# Patient Record
Sex: Female | Born: 1940 | Race: White | Hispanic: No | Marital: Married | Smoking: Former smoker
Health system: Southern US, Community
[De-identification: ages and names within clinical notes are randomized; demographics above are authoritative.]

## PROBLEM LIST (undated history)

## (undated) DIAGNOSIS — E214 Other specified disorders of parathyroid gland: Secondary | ICD-10-CM

## (undated) DIAGNOSIS — N200 Calculus of kidney: Secondary | ICD-10-CM

## (undated) DIAGNOSIS — H9190 Unspecified hearing loss, unspecified ear: Secondary | ICD-10-CM

## (undated) DIAGNOSIS — M1990 Unspecified osteoarthritis, unspecified site: Secondary | ICD-10-CM

## (undated) DIAGNOSIS — E785 Hyperlipidemia, unspecified: Secondary | ICD-10-CM

## (undated) DIAGNOSIS — Z87442 Personal history of urinary calculi: Secondary | ICD-10-CM

## (undated) DIAGNOSIS — I10 Essential (primary) hypertension: Secondary | ICD-10-CM

## (undated) DIAGNOSIS — G709 Myoneural disorder, unspecified: Secondary | ICD-10-CM

## (undated) DIAGNOSIS — G250 Essential tremor: Secondary | ICD-10-CM

## (undated) DIAGNOSIS — K219 Gastro-esophageal reflux disease without esophagitis: Secondary | ICD-10-CM

## (undated) HISTORY — DX: Essential (primary) hypertension: I10

## (undated) HISTORY — DX: Hyperlipidemia, unspecified: E78.5

## (undated) HISTORY — PX: JOINT REPLACEMENT: SHX530

## (undated) HISTORY — DX: Unspecified hearing loss, unspecified ear: H91.90

## (undated) HISTORY — PX: KIDNEY STONE SURGERY: SHX686

---

## 2011-09-01 HISTORY — PX: JOINT REPLACEMENT: SHX530

## 2011-09-11 ENCOUNTER — Inpatient Hospital Stay: Admit: 2011-09-11 | Payer: Self-pay | Admitting: Orthopaedic Surgery

## 2011-09-11 SURGERY — ARTHROPLASTY, HIP, TOTAL, ANTERIOR APPROACH
Anesthesia: General | Laterality: Right

## 2011-09-11 SURGERY — Surgical Case
Anesthesia: *Unknown

## 2011-12-23 ENCOUNTER — Other Ambulatory Visit (HOSPITAL_COMMUNITY): Payer: Self-pay | Admitting: Orthopaedic Surgery

## 2012-01-27 HISTORY — PX: OTHER SURGICAL HISTORY: SHX169

## 2012-02-08 ENCOUNTER — Encounter (HOSPITAL_COMMUNITY): Payer: Self-pay | Admitting: Pharmacy Technician

## 2012-02-12 ENCOUNTER — Encounter (HOSPITAL_COMMUNITY)
Admission: RE | Admit: 2012-02-12 | Discharge: 2012-02-12 | Disposition: A | Discharge status: Home / Self Care | Payer: Medicare Other | Source: Ambulatory Visit | Attending: Orthopaedic Surgery | Admitting: Orthopaedic Surgery

## 2012-02-12 ENCOUNTER — Encounter (HOSPITAL_COMMUNITY): Payer: Self-pay

## 2012-02-12 HISTORY — DX: Gastro-esophageal reflux disease without esophagitis: K21.9

## 2012-02-12 HISTORY — DX: Unspecified osteoarthritis, unspecified site: M19.90

## 2012-02-12 LAB — CBC
Platelets: 349 10*3/uL (ref 150–400)
RBC: 4.57 MIL/uL (ref 3.87–5.11)
RDW: 13.7 % (ref 11.5–15.5)
WBC: 7.3 10*3/uL (ref 4.0–10.5)

## 2012-02-12 LAB — BASIC METABOLIC PANEL
Calcium: 10.4 mg/dL (ref 8.4–10.5)
GFR calc Af Amer: >90 mL/min (ref >90)
GFR calc non Af Amer: 86 mL/min — ABNORMAL LOW (ref >90)
Sodium: 140 mEq/L (ref 135–145)

## 2012-02-12 LAB — PROTIME-INR: INR: 0.93 (ref 0.00–1.49)

## 2012-02-12 LAB — URINALYSIS, ROUTINE W REFLEX MICROSCOPIC
Leukocytes, UA: NEGATIVE
Nitrite: NEGATIVE
Specific Gravity, Urine: 1.02 (ref 1.005–1.030)
Urobilinogen, UA: 0.2 mg/dL (ref 0.0–1.0)
pH: 5.5 (ref 5.0–8.0)

## 2012-02-12 LAB — SURGICAL PCR SCREEN
MRSA, PCR: NEGATIVE
Staphylococcus aureus: POSITIVE — AB

## 2012-02-12 LAB — URINE MICROSCOPIC-ADD ON

## 2012-02-12 LAB — APTT: aPTT: 34 seconds (ref 24–37)

## 2012-02-12 NOTE — Patient Instructions (Signed)
20 Dawanda Mapel  02/12/2012   Your procedure is scheduled on:  02-19-2012  Report to [facility] at  1000 AM.  Call this number if you have problems the morning of surgery: [phone]   Remember:   Do not eat food or drink liquids:After Midnight.  .  Take these medicines the morning of surgery with A SIP OF WATER: no meds to take   Do not wear jewelry or make up.  Do not wear lotions, powders, or perfumes.Do not wear deodorant.    Do not bring valuables to the hospital.  Contacts, dentures or bridgework may not be worn into surgery.  Leave suitcase in the car. After surgery it may be brought to your room.  For patients admitted to the hospital, checkout time is 11:00 AM the day of    discharge.     Special Instructions: CHG Shower Use Special Wash: 1/2 bottle night before surgery and 1/2 bottle morning of surgery, use regular soap on face and front and back private area.no shaving for 2 days before showers.   Please read over the following fact sheets that you were given: MRSA Information, blood fact sheet  [facility] pre op nurse phone number [phone], call if needed

## 2012-02-19 ENCOUNTER — Encounter (HOSPITAL_COMMUNITY)
Admission: RE | Disposition: A | Discharge status: Home Health | Payer: Self-pay | Source: Ambulatory Visit | Attending: Orthopaedic Surgery

## 2012-02-19 ENCOUNTER — Encounter (HOSPITAL_COMMUNITY): Payer: Self-pay | Admitting: *Deleted

## 2012-02-19 ENCOUNTER — Inpatient Hospital Stay (HOSPITAL_COMMUNITY)
Admission: RE | Admit: 2012-02-19 | Discharge: 2012-02-21 | DRG: 470 | Disposition: A | Discharge status: Home Health | Payer: Medicare Other | Source: Ambulatory Visit | Attending: Orthopaedic Surgery | Admitting: Orthopaedic Surgery

## 2012-02-19 ENCOUNTER — Encounter (HOSPITAL_COMMUNITY): Payer: Self-pay | Admitting: Orthopedic Surgery

## 2012-02-19 ENCOUNTER — Other Ambulatory Visit (HOSPITAL_COMMUNITY): Payer: Medicare Other

## 2012-02-19 ENCOUNTER — Inpatient Hospital Stay (HOSPITAL_COMMUNITY): Payer: Medicare Other

## 2012-02-19 ENCOUNTER — Encounter (HOSPITAL_COMMUNITY): Payer: Self-pay | Admitting: Anesthesiology

## 2012-02-19 ENCOUNTER — Ambulatory Visit (HOSPITAL_COMMUNITY): Payer: Medicare Other

## 2012-02-19 ENCOUNTER — Ambulatory Visit (HOSPITAL_COMMUNITY): Payer: Medicare Other | Admitting: Anesthesiology

## 2012-02-19 DIAGNOSIS — Z01812 Encounter for preprocedural laboratory examination: Secondary | ICD-10-CM

## 2012-02-19 DIAGNOSIS — M1610 Unilateral primary osteoarthritis, unspecified hip: Principal | ICD-10-CM | POA: Diagnosis present

## 2012-02-19 DIAGNOSIS — M169 Osteoarthritis of hip, unspecified: Secondary | ICD-10-CM

## 2012-02-19 HISTORY — PX: TOTAL HIP ARTHROPLASTY: SHX124

## 2012-02-19 LAB — TYPE AND SCREEN

## 2012-02-19 SURGERY — ARTHROPLASTY, HIP, TOTAL, ANTERIOR APPROACH
Anesthesia: Spinal | Site: Hip | Laterality: Right | Wound class: Clean

## 2012-02-19 MED ORDER — ONDANSETRON HCL 4 MG/2ML IJ SOLN
INTRAMUSCULAR | Status: DC | PRN
  Administered 2012-02-19: 4 mg via INTRAVENOUS

## 2012-02-19 MED ORDER — MIDAZOLAM HCL 5 MG/5ML IJ SOLN
INTRAMUSCULAR | Status: DC | PRN
  Administered 2012-02-19 (×2): 1 mg via INTRAVENOUS

## 2012-02-19 MED ORDER — CEFAZOLIN SODIUM 1-5 GM-% IV SOLN
1.0000 g | Freq: Four times a day (QID) | INTRAVENOUS | Status: AC
  Administered 2012-02-19 – 2012-02-20 (×2): 1 g via INTRAVENOUS
  Filled 2012-02-19 (×2): qty 50

## 2012-02-19 MED ORDER — SODIUM CHLORIDE 0.9 % IV SOLN
INTRAVENOUS | Status: DC
  Administered 2012-02-19: 75 mL/h via INTRAVENOUS
  Administered 2012-02-20: 06:00:00 via INTRAVENOUS

## 2012-02-19 MED ORDER — DOCUSATE SODIUM 100 MG PO CAPS
100.0000 mg | ORAL_CAPSULE | Freq: Two times a day (BID) | ORAL | Status: DC
  Administered 2012-02-19 – 2012-02-21 (×3): 100 mg via ORAL

## 2012-02-19 MED ORDER — 0.9 % SODIUM CHLORIDE (POUR BTL) OPTIME
TOPICAL | Status: DC | PRN
  Administered 2012-02-19: 1000 mL

## 2012-02-19 MED ORDER — HYDROMORPHONE HCL PF 1 MG/ML IJ SOLN
0.2500 mg | INTRAMUSCULAR | Status: DC | PRN
  Administered 2012-02-19 (×3): 0.5 mg via INTRAVENOUS

## 2012-02-19 MED ORDER — LACTATED RINGERS IV SOLN
INTRAVENOUS | Status: DC | PRN
  Administered 2012-02-19 (×3): via INTRAVENOUS

## 2012-02-19 MED ORDER — KETAMINE HCL 10 MG/ML IJ SOLN
INTRAMUSCULAR | Status: DC | PRN
  Administered 2012-02-19: 10 mg via INTRAVENOUS
  Administered 2012-02-19 (×8): 5 mg via INTRAVENOUS

## 2012-02-19 MED ORDER — METHOCARBAMOL 100 MG/ML IJ SOLN
500.0000 mg | Freq: Four times a day (QID) | INTRAVENOUS | Status: DC | PRN
  Administered 2012-02-19: 500 mg via INTRAVENOUS
  Filled 2012-02-19 (×2): qty 5

## 2012-02-19 MED ORDER — PHENYLEPHRINE HCL 10 MG/ML IJ SOLN
INTRAMUSCULAR | Status: DC | PRN
  Administered 2012-02-19 (×2): 80 ug via INTRAVENOUS

## 2012-02-19 MED ORDER — ONDANSETRON HCL 4 MG/2ML IJ SOLN: 4.0000 mg | Freq: Four times a day (QID) | INTRAMUSCULAR | Status: DC | PRN

## 2012-02-19 MED ORDER — LACTATED RINGERS IV SOLN
INTRAVENOUS | Status: DC
  Administered 2012-02-19: 1000 mL via INTRAVENOUS

## 2012-02-19 MED ORDER — PHENYLEPHRINE HCL 10 MG/ML IJ SOLN
10.0000 mg | INTRAVENOUS | Status: DC | PRN
  Administered 2012-02-19: 50 ug/min via INTRAVENOUS

## 2012-02-19 MED ORDER — BLISTEX EX OINT
TOPICAL_OINTMENT | CUTANEOUS | Status: AC
  Administered 2012-02-19: 19:00:00
  Filled 2012-02-19: qty 10

## 2012-02-19 MED ORDER — FENTANYL CITRATE 0.05 MG/ML IJ SOLN
INTRAMUSCULAR | Status: DC | PRN
  Administered 2012-02-19: 100 ug via INTRAVENOUS

## 2012-02-19 MED ORDER — KETOROLAC TROMETHAMINE 15 MG/ML IJ SOLN
7.5000 mg | Freq: Four times a day (QID) | INTRAMUSCULAR | Status: AC
  Administered 2012-02-19 – 2012-02-20 (×3): 7.5 mg via INTRAVENOUS
  Filled 2012-02-19 (×4): qty 1

## 2012-02-19 MED ORDER — PROPOFOL 10 MG/ML IV EMUL
INTRAVENOUS | Status: DC | PRN
  Administered 2012-02-19: 300 ug/kg/min via INTRAVENOUS

## 2012-02-19 MED ORDER — CEFAZOLIN SODIUM-DEXTROSE 2-3 GM-% IV SOLR
INTRAVENOUS | Status: AC
  Filled 2012-02-19: qty 50

## 2012-02-19 MED ORDER — ACETAMINOPHEN 10 MG/ML IV SOLN
INTRAVENOUS | Status: DC | PRN
  Administered 2012-02-19: 1000 mg via INTRAVENOUS

## 2012-02-19 MED ORDER — ASPIRIN EC 325 MG PO TBEC
325.0000 mg | DELAYED_RELEASE_TABLET | Freq: Two times a day (BID) | ORAL | Status: DC
  Filled 2012-02-19 (×2): qty 1

## 2012-02-19 MED ORDER — BUPIVACAINE IN DEXTROSE 0.75-8.25 % IT SOLN
INTRATHECAL | Status: DC | PRN
  Administered 2012-02-19: 10 mg via INTRATHECAL

## 2012-02-19 MED ORDER — DEXAMETHASONE SODIUM PHOSPHATE 10 MG/ML IJ SOLN
INTRAMUSCULAR | Status: DC | PRN
  Administered 2012-02-19: 10 mg via INTRAVENOUS

## 2012-02-19 MED ORDER — DIPHENHYDRAMINE HCL 12.5 MG/5ML PO ELIX: 12.5000 mg | ORAL_SOLUTION | ORAL | Status: DC | PRN

## 2012-02-19 MED ORDER — ATORVASTATIN CALCIUM 20 MG PO TABS
20.0000 mg | ORAL_TABLET | Freq: Every day | ORAL | Status: DC
  Administered 2012-02-19 – 2012-02-20 (×2): 20 mg via ORAL
  Filled 2012-02-19 (×3): qty 1

## 2012-02-19 MED ORDER — HYDROMORPHONE HCL PF 1 MG/ML IJ SOLN
INTRAMUSCULAR | Status: AC
  Filled 2012-02-19: qty 1

## 2012-02-19 MED ORDER — ASPIRIN EC 325 MG PO TBEC
325.0000 mg | DELAYED_RELEASE_TABLET | Freq: Two times a day (BID) | ORAL | Status: DC
  Administered 2012-02-20 – 2012-02-21 (×3): 325 mg via ORAL
  Filled 2012-02-19 (×5): qty 1

## 2012-02-19 MED ORDER — ACETAMINOPHEN 325 MG PO TABS: 650.0000 mg | ORAL_TABLET | Freq: Four times a day (QID) | ORAL | Status: DC | PRN

## 2012-02-19 MED ORDER — OXYCODONE HCL 5 MG PO TABS
5.0000 mg | ORAL_TABLET | ORAL | Status: DC | PRN
  Administered 2012-02-20: 10 mg via ORAL
  Administered 2012-02-21 (×3): 5 mg via ORAL
  Filled 2012-02-19: qty 1
  Filled 2012-02-19: qty 2
  Filled 2012-02-19: qty 1
  Filled 2012-02-19: qty 2

## 2012-02-19 MED ORDER — METOCLOPRAMIDE HCL 5 MG/ML IJ SOLN: 5.0000 mg | Freq: Three times a day (TID) | INTRAMUSCULAR | Status: DC | PRN

## 2012-02-19 MED ORDER — LIDOCAINE HCL (CARDIAC) 20 MG/ML IV SOLN
INTRAVENOUS | Status: DC | PRN
  Administered 2012-02-19: 50 mg via INTRAVENOUS

## 2012-02-19 MED ORDER — CEFAZOLIN SODIUM-DEXTROSE 2-3 GM-% IV SOLR: 2.0000 g | INTRAVENOUS | Status: DC

## 2012-02-19 MED ORDER — MORPHINE SULFATE 2 MG/ML IJ SOLN: 2.0000 mg | INTRAMUSCULAR | Status: DC | PRN

## 2012-02-19 MED ORDER — ALUM & MAG HYDROXIDE-SIMETH 200-200-20 MG/5ML PO SUSP: 30.0000 mL | ORAL | Status: DC | PRN

## 2012-02-19 MED ORDER — METOCLOPRAMIDE HCL 10 MG PO TABS: 5.0000 mg | ORAL_TABLET | Freq: Three times a day (TID) | ORAL | Status: DC | PRN

## 2012-02-19 MED ORDER — VITAMIN D3 25 MCG (1000 UNIT) PO TABS
5000.0000 [IU] | ORAL_TABLET | Freq: Every day | ORAL | Status: DC
  Administered 2012-02-20 – 2012-02-21 (×2): 5000 [IU] via ORAL
  Filled 2012-02-19 (×3): qty 5

## 2012-02-19 MED ORDER — ONDANSETRON HCL 4 MG PO TABS: 4.0000 mg | ORAL_TABLET | Freq: Four times a day (QID) | ORAL | Status: DC | PRN

## 2012-02-19 MED ORDER — MENTHOL 3 MG MT LOZG: 1.0000 | LOZENGE | OROMUCOSAL | Status: DC | PRN

## 2012-02-19 MED ORDER — EPHEDRINE SULFATE 50 MG/ML IJ SOLN
INTRAMUSCULAR | Status: DC | PRN
  Administered 2012-02-19 (×2): 5 mg via INTRAVENOUS

## 2012-02-19 MED ORDER — ACETAMINOPHEN 10 MG/ML IV SOLN
INTRAVENOUS | Status: AC
  Filled 2012-02-19: qty 100

## 2012-02-19 MED ORDER — ACETAMINOPHEN 650 MG RE SUPP: 650.0000 mg | Freq: Four times a day (QID) | RECTAL | Status: DC | PRN

## 2012-02-19 MED ORDER — ZOLPIDEM TARTRATE 5 MG PO TABS: 5.0000 mg | ORAL_TABLET | Freq: Every evening | ORAL | Status: DC | PRN

## 2012-02-19 MED ORDER — PHENOL 1.4 % MT LIQD: 1.0000 | OROMUCOSAL | Status: DC | PRN

## 2012-02-19 MED ORDER — METHOCARBAMOL 500 MG PO TABS
500.0000 mg | ORAL_TABLET | Freq: Four times a day (QID) | ORAL | Status: DC | PRN
  Administered 2012-02-21: 500 mg via ORAL
  Filled 2012-02-19: qty 1

## 2012-02-19 SURGICAL SUPPLY — 36 items
BAG ZIPLOCK 12X15 (MISCELLANEOUS) ×4 IMPLANT
BLADE SAW SGTL 18X1.27X75 (BLADE) ×2 IMPLANT
BLADE SURG SZ10 CARB STEEL (BLADE) ×2 IMPLANT
CELLS DAT CNTRL 66122 CELL SVR (MISCELLANEOUS) ×1 IMPLANT
CLOTH BEACON ORANGE TIMEOUT ST (SAFETY) ×2 IMPLANT
DRAPE C-ARM 42X72 X-RAY (DRAPES) ×2 IMPLANT
DRAPE STERI IOBAN 125X83 (DRAPES) ×2 IMPLANT
DRAPE U-SHAPE 47X51 STRL (DRAPES) ×6 IMPLANT
DRSG MEPILEX BORDER 4X8 (GAUZE/BANDAGES/DRESSINGS) ×2 IMPLANT
DURAPREP 26ML APPLICATOR (WOUND CARE) ×2 IMPLANT
ELECT BLADE TIP CTD 4 INCH (ELECTRODE) ×2 IMPLANT
ELECT REM PT RETURN 9FT ADLT (ELECTROSURGICAL) ×2
ELECTRODE REM PT RTRN 9FT ADLT (ELECTROSURGICAL) ×1 IMPLANT
FACESHIELD LNG OPTICON STERILE (SAFETY) ×8 IMPLANT
GAUZE XEROFORM 1X8 LF (GAUZE/BANDAGES/DRESSINGS) ×2 IMPLANT
GLOVE BIO SURGEON STRL SZ7 (GLOVE) ×2 IMPLANT
GLOVE BIO SURGEON STRL SZ7.5 (GLOVE) ×2 IMPLANT
GLOVE BIOGEL PI IND STRL 7.5 (GLOVE) IMPLANT
GLOVE BIOGEL PI IND STRL 8 (GLOVE) ×1 IMPLANT
GLOVE BIOGEL PI INDICATOR 7.5 (GLOVE)
GLOVE BIOGEL PI INDICATOR 8 (GLOVE) ×1
GLOVE ECLIPSE 7.0 STRL STRAW (GLOVE) ×2 IMPLANT
GOWN STRL REIN XL XLG (GOWN DISPOSABLE) ×4 IMPLANT
KIT BASIN OR (CUSTOM PROCEDURE TRAY) ×2 IMPLANT
PACK TOTAL JOINT (CUSTOM PROCEDURE TRAY) ×2 IMPLANT
PADDING CAST COTTON 6X4 STRL (CAST SUPPLIES) ×2 IMPLANT
RTRCTR WOUND ALEXIS 18CM MED (MISCELLANEOUS) ×2
STAPLER VISISTAT 35W (STAPLE) ×2 IMPLANT
SUT ETHIBOND NAB CT1 #1 30IN (SUTURE) ×4 IMPLANT
SUT VIC AB 1 CT1 36 (SUTURE) ×4 IMPLANT
SUT VIC AB 2-0 CT1 27 (SUTURE) ×2
SUT VIC AB 2-0 CT1 TAPERPNT 27 (SUTURE) ×2 IMPLANT
SUT VLOC 180 0 24IN GS25 (SUTURE) ×2 IMPLANT
TOWEL OR 17X26 10 PK STRL BLUE (TOWEL DISPOSABLE) ×2 IMPLANT
TOWEL OR NON WOVEN STRL DISP B (DISPOSABLE) ×2 IMPLANT
TRAY FOLEY CATH 14FRSI W/METER (CATHETERS) ×2 IMPLANT

## 2012-02-19 NOTE — Anesthesia Postprocedure Evaluation (Signed)
  Anesthesia Post-op Note  Patient: Alison Chandler  Procedure(s) Performed: Procedure(s) (LRB): TOTAL HIP ARTHROPLASTY ANTERIOR APPROACH (Right)  Patient Location: PACU  Anesthesia Type: Spinal  Level of Consciousness: oriented and sedated  Airway and Oxygen Therapy: Patient Spontanous Breathing and Patient connected to nasal cannula oxygen  Post-op Pain: mild  Post-op Assessment: Post-op Vital signs reviewed, Patient's Cardiovascular Status Stable, Respiratory Function Stable and Patent Airway  Post-op Vital Signs: stable  Complications: No apparent anesthesia complications

## 2012-02-19 NOTE — Anesthesia Procedure Notes (Addendum)
Spinal  Patient location during procedure: OR Start time: 02/19/2012 12:57 PM End time: 02/19/2012 1:07 PM Staffing Anesthesiologist: Lestine Box B Performed by: anesthesiologist  Preanesthetic Checklist Completed: patient identified, site marked, surgical consent, pre-op evaluation, timeout performed, IV checked, risks and benefits discussed and monitors and equipment checked Spinal Block Patient position: sitting Prep: Betadine and site prepped and draped Patient monitoring: heart rate, cardiac monitor, continuous pulse ox and blood pressure Approach: right paramedian Location: L3-4 Injection technique: single-shot Needle Needle type: Quincke  Needle gauge: 25 G Needle length: 9 cm Needle insertion depth: 4 cm Assessment Sensory level: T6 Additional Notes [id number], 2014-09

## 2012-02-19 NOTE — H&P (Signed)
Alison Chandler is an 71 Chandler.o. female.   Chief Complaint:   Severe right hip pain HPI:   71 yo female with bone-on-bone wear of her right hip with severe end-stage OA.  She has failed conservative treatment and wishes to proceed with a right total hip replacement.  She understands the risks of blood loss, nerve and vessel injury, infection, DVT and PE.  The goals are decreased pain and improved mobility.  Past Medical History  Diagnosis Date  . Arthritis   . GERD (gastroesophageal reflux disease)     Past Surgical History  Procedure Date  . Colonscopy 01-27-2012    History reviewed. No pertinent family history. Social History:  reports that she quit smoking about 40 years ago. She has never used smokeless tobacco. She reports that she drinks about .6 ounces of alcohol per week. She reports that she does not use illicit drugs.  Allergies: No Known Allergies  Medications Prior to Admission  Medication Sig Dispense Refill  . acetaminophen (TYLENOL) 500 MG tablet Take 500-1,000 mg by mouth every 6 (six) hours as needed. For pain      . atorvastatin (LIPITOR) 20 MG tablet Take 20 mg by mouth at bedtime.      . Calcium Carbonate (CALCIUM 600 PO) Take 1 tablet by mouth daily with breakfast. Pt says calcium citrate      . cholecalciferol (VITAMIN D) 1000 UNITS tablet Take 5,000 Units by mouth daily with breakfast.      . estradiol-norethindrone (COMBIPATCH) 0.05-0.14 MG/DAY Place 1 patch onto the skin once a week. On Mondays      . fish oil-omega-3 fatty acids 1000 MG capsule Take 1 g by mouth daily with breakfast.      . glucosamine-chondroitin 500-400 MG tablet Take 1 tablet by mouth daily with breakfast.      . naproxen sodium (ANAPROX) 220 MG tablet Take 220-440 mg by mouth every 12 (twelve) hours as needed. For pain        Results for orders placed during the hospital encounter of 02/19/12 (from the past 48 hour(s))  ABO/RH     Status: Normal   Collection Time   02/19/12 10:25 AM   Component Value Range Comment   ABO/RH(D) O POS     TYPE AND SCREEN     Status: Normal   Collection Time   02/19/12 10:50 AM      Component Value Range Comment   ABO/RH(D) O POS      Antibody Screen NEG      Sample Expiration 02/22/2012      No results found.  Review of Systems  All other systems reviewed and are negative.    Blood pressure 182/109, pulse 97, temperature 98 F (36.7 C), resp. rate 20, SpO2 96.00%. Physical Exam  Constitutional: She is oriented to person, place, and time. She appears well-developed and well-nourished.  HENT:  Head: Normocephalic and atraumatic.  Eyes: EOM are normal. Pupils are equal, round, and reactive to light.  Neck: Normal range of motion. Neck supple.  Cardiovascular: Normal rate and regular rhythm.   Respiratory: Effort normal and breath sounds normal.  GI: Soft. Bowel sounds are normal.  Musculoskeletal:       Right hip: She exhibits decreased range of motion, decreased strength and bony tenderness.  Neurological: She is alert and oriented to person, place, and time.  Skin: Skin is warm and dry.  Psychiatric: She has a normal mood and affect.     Assessment/Plan Severe right hip  pain with end-stage OA 1) to the OR today for a right total hip replacement  Alison Chandler 02/19/2012, 12:45 PM

## 2012-02-19 NOTE — Brief Op Note (Signed)
02/19/2012  3:20 PM  PATIENT:  Alison Chandler  71 y.o. female  PRE-OPERATIVE DIAGNOSIS:  Severe osteoarthritis right hip  POST-OPERATIVE DIAGNOSIS:  Severe osteoarthritis right hip  PROCEDURE:  Procedure(s) (LRB): TOTAL HIP ARTHROPLASTY ANTERIOR APPROACH (Right)  SURGEON:  Surgeon(s) and Role:    * Kathryne Hitch, MD - Primary    * Velna Ochs, MD - Assisting  PHYSICIAN ASSISTANT:   ASSISTANTS: Marcene Corning, MD    ANESTHESIA:   spinal  EBL:  Total I/O In: 2300 [I.V.:2300] Out: 830 [Urine:330; Blood:500]  BLOOD ADMINISTERED:none  DRAINS: none   LOCAL MEDICATIONS USED:  NONE  SPECIMEN:  No Specimen  DISPOSITION OF SPECIMEN:  N/A  COUNTS:  YES  TOURNIQUET:  * No tourniquets in log *  DICTATION: .Other Dictation: Dictation Number [id number]  PLAN OF CARE: Admit to inpatient   PATIENT DISPOSITION:  PACU - hemodynamically stable.   Delay start of Pharmacological VTE agent (>24hrs) due to surgical blood loss or risk of bleeding: no

## 2012-02-19 NOTE — Progress Notes (Signed)
Patient showed RN a spider bite top of upper left leg. Small red bite with a bruise around it from scratching per patient. Note left for MD on front of chart.

## 2012-02-19 NOTE — Transfer of Care (Signed)
Immediate Anesthesia Transfer of Care Note  Patient: Alison Chandler  Procedure(s) Performed: Procedure(s) (LRB): TOTAL HIP ARTHROPLASTY ANTERIOR APPROACH (Right)  Patient Location: PACU  Anesthesia Type: MAC and Spinal  Level of Consciousness: awake, alert  and oriented  Airway & Oxygen Therapy: Patient Spontanous Breathing and Patient connected to face mask oxygen  Post-op Assessment: Report given to PACU RN, Post -op Vital signs reviewed and stable and Patient moving all extremities  Post vital signs: Reviewed and stable  Complications: No apparent anesthesia complications

## 2012-02-19 NOTE — Preoperative (Signed)
Beta Blockers   Reason not to administer Beta Blockers:Not Applicable, no on home BB

## 2012-02-19 NOTE — Anesthesia Preprocedure Evaluation (Addendum)
Anesthesia Evaluation  Patient identified by MRN, date of birth, ID band Patient awake    Reviewed: Allergy & Precautions, H&P , NPO status , Patient's Chart, lab work & pertinent test results, reviewed documented beta blocker date and time   Airway Mallampati: II TM Distance: >3 FB Neck ROM: Full    Dental  (+) Teeth Intact and Dental Advisory Given   Pulmonary neg pulmonary ROS,  breath sounds clear to auscultation        Cardiovascular negative cardio ROS  Rhythm:Regular Rate:Normal  Denies HTN, borderline, no Rx Denies cardiac symptoms   Neuro/Psych negative neurological ROS  negative psych ROS   GI/Hepatic negative GI ROS, Neg liver ROS,   Endo/Other  negative endocrine ROS  Renal/GU negative Renal ROS  negative genitourinary   Musculoskeletal   Abdominal   Peds negative pediatric ROS (+)  Hematology negative hematology ROS (+)   Anesthesia Other Findings   Reproductive/Obstetrics negative OB ROS                           Anesthesia Physical Anesthesia Plan  ASA: II  Anesthesia Plan: Spinal   Post-op Pain Management:    Induction: Intravenous  Airway Management Planned: Mask  Additional Equipment:   Intra-op Plan:   Post-operative Plan:   Informed Consent: I have reviewed the patients History and Physical, chart, labs and discussed the procedure including the risks, benefits and alternatives for the proposed anesthesia with the patient or authorized representative who has indicated his/her understanding and acceptance.   Dental advisory given  Plan Discussed with: CRNA and Surgeon  Anesthesia Plan Comments:        Anesthesia Quick Evaluation

## 2012-02-20 LAB — CBC
HCT: 35.2 % — ABNORMAL LOW (ref 36.0–46.0)
Hemoglobin: 11.4 g/dL — ABNORMAL LOW (ref 12.0–15.0)
MCH: 30 pg (ref 26.0–34.0)
MCHC: 32.4 g/dL (ref 30.0–36.0)
RDW: 13.6 % (ref 11.5–15.5)

## 2012-02-20 LAB — BASIC METABOLIC PANEL
BUN: 15 mg/dL (ref 6–23)
Calcium: 9.5 mg/dL (ref 8.4–10.5)
GFR calc Af Amer: >90 mL/min (ref >90)
GFR calc non Af Amer: >90 mL/min (ref >90)
Glucose, Bld: 150 mg/dL — ABNORMAL HIGH (ref 70–99)
Potassium: 3.5 mEq/L (ref 3.5–5.1)
Sodium: 135 mEq/L (ref 135–145)

## 2012-02-20 NOTE — Op Note (Signed)
Alison Chandler, Alison Chandler                 ACCOUNT NO.:  [id number]  MEDICAL RECORD NO.:  [id number]  LOCATION:  1618                         FACILITY:  [facility]  PHYSICIAN:  Vanita Panda. Magnus Ivan, M.D.DATE OF BIRTH:  1941-06-29  DATE OF PROCEDURE:  02/19/2012 DATE OF DISCHARGE:                              OPERATIVE REPORT   PREOPERATIVE DIAGNOSES:  End-stage arthritis and degenerative joint disease, right hip.  POSTOPERATIVE DIAGNOSES:  End-stage arthritis and degenerative joint disease, right hip.  PROCEDURE:  Right total hip arthroplasty through direct anterior approach.  IMPLANTS:  Size 52 sector Gription acetabular component from DePuy, size 12 Corail femoral component with standard offset, size 32+ 4 neutral polyethylene liner, size 32+ 1 metal hip ball.  SURGEON:  Vanita Panda. Magnus Ivan, M.D.  ASSISTANT:  Lubertha Basque. Jerl Santos, M.D.  ANESTHESIA:  Spinal.  BLOOD LOSS:  500-600 mL.  COMPLICATIONS:  None.  INDICATIONS:  Alison Chandler is a 71 year old female with severe debilitating end-stage arthritis of her right hip.  This has been going on for many years and she has failed conservative treatment.  She wished to proceed with total hip arthroplasty.  X-rays showed bone-on-bone wear of her hip.  She understands the risk of acute blood loss anemia, DVT, PE, infection, fracture, and does wish to proceed.  The goals are improving mobility and decreased pain.  PROCEDURE DESCRIPTION:  After informed consent was obtained, appropriate right hip was marked.  She was brought to the operating room and while she was on her stretcher, spinal anesthesia was obtained.  She was then placed in a supine position and on her stretcher, and a Foley catheter was placed and then traction boots were placed on both her feet for placement of supine on the operating Hana table with perineal post in place.  In the supine position, both legs were placed in traction boots in inline skeletal traction with no  traction applied.  Her hips were then assessed under direct fluoroscopy and had obtained the hips center for the equal view of the pelvis to obtain leg length measurements.  We then put this tape to C-arm out of the field and prepped the right hip with DuraPrep and sterile drapes.  A time-out was called and she was identified as correct patient and correct right hip.  I then made an incision just posterior and inferior to the anterior-superior iliac spine and carried this obliquely down the leg.  I dissected down to the tensor fascia lata and then divided the tensor fascia lata obliquely. The cobra retractors were placed around the lateral neck and one in the medial neck underneath the rectus femoris.  I then divided the hip capsule and cauterized the lateral femoral circumflex vessels.  I put the cobra retractors within the hip capsule.  I then made my femoral neck cut just proximal to the lesser trochanter and finished this with an osteotome using an oscillating saw.  I removed the femoral head in its entirety and then cleaned the acetabulum of soft tissue and debris. I then placed a Bent Hohmann medially and a cobra retractor laterally and began reaming from size 42 reamer in 2 mm increments all the way up to  50 with all reamers placed under direct visualization and the last reamers placed under direct visualization and direct fluoroscopy to obtain my depth of resection as well as inclination and abduction.  I then placed a real size 50 Gription acetabular component from DePuy and knocked this into place under direct visualization and fluoroscopy.  I was pleased with its positioning.  Next, attention was turned to the femur.  The leg was externally rotated 90 degrees, extended and adducted.  I placed a Mueller retractor medially and a long Hohmann retractor into the greater trochanter.  I released the piriformis and the lateral capsule and gained access to the femoral canal.  I used  a box cutting guide to open the femoral canal and then used a rongeur to lateralize.  I then began broaching from a size 8 broach all the way to a size 12 and the 12 was felt to be stable.  I placed a standard trial neck and a 32+ 1.5 hip ball.  We reduced this in the acetabulum and it was stable and her leg lengths were equal, so we redislocated the hip and removed all trial components.  We then placed the real Corail femoral component, size 12 with a collar and the real 32+ 1 hip ball. We reduced this back into the acetabulum, and again it was stable.  We irrigated the soft tissues with normal saline and closed the joint itself.  I closed the joint capsule with interrupted #1 Ethibond suture. I used a running 0 V-Loc suture in the tensor fascia lata to close this interval and 2-0 Vicryl in the deep and subcutaneous tissue and staples on the skin.  Well-padded sterile dressing was applied.  She was taken off the Hana table to the recovery room in stable condition.  All final counts were correct.  There were no complications noted.     Vanita Panda. Magnus Ivan, M.D.     CYB/MEDQ  D:  02/19/2012  T:  02/19/2012  Job:  [id number]

## 2012-02-20 NOTE — Progress Notes (Signed)
Physical Therapy Treatment Patient Details Name: Alison Chandler MRN: [id number] DOB: July 08, 1941 Today's Date: 02/20/2012 Time: 1308-6578 PT Time Calculation (min): 24 min  PT Assessment / Plan / Recommendation Comments on Treatment Session  Pt progressing very well with ambulation and exercises.  Trialed straight cane, will continue to trial both to determine most comfort.  Pt has both at home.     Follow Up Recommendations  Home health PT    Barriers to Discharge        Equipment Recommendations  Rolling walker with 5" wheels    Recommendations for Other Services    Frequency 7X/week   Plan Discharge plan remains appropriate    Precautions / Restrictions Precautions Precautions: None Restrictions Weight Bearing Restrictions: No   Pertinent Vitals/Pain 2/10    Mobility  Bed Mobility Bed Mobility: Not assessed (Pt was sitting in recliner when PT arrived. ) Transfers Transfers: Sit to Stand;Stand to Sit Sit to Stand: 5: Supervision;With upper extremity assist;With armrests;From chair/3-in-1 Stand to Sit: 5: Supervision;With upper extremity assist;With armrests;To chair/3-in-1 Details for Transfer Assistance: Performed transfer x 2 reps from recliner and from 3in1.  Min cues for technique.   Ambulation/Gait Ambulation/Gait Assistance: 5: Supervision Ambulation Distance (Feet): 250 Feet Assistive device: Rolling walker;Straight cane Ambulation/Gait Assistance Details: Min cues for safety and maintaining position inside of RW.  Transitioned to cane initially, however pt somewhat unbalanced, then used RW for approx 150' then switched back to straight cane.  Pt did much better with cane on second attempt.   Gait Pattern: Step-to pattern;Step-through pattern Gait velocity: WFL Stairs: No Wheelchair Mobility Wheelchair Mobility: No    Exercises Total Joint Exercises Hip ABduction/ADduction: Strengthening;Right;10 reps;Standing Knee Flexion: Strengthening;Right;10  reps;Standing Marching in Standing: AROM;Right;10 reps;Standing   PT Diagnosis:    PT Problem List:   PT Treatment Interventions:     PT Goals Acute Rehab PT Goals PT Goal Formulation: With patient Time For Goal Achievement: 02/22/12 Potential to Achieve Goals: Good Pt will Ambulate: >150 feet;with modified independence;with least restrictive assistive device PT Goal: Ambulate - Progress: Progressing toward goal Pt will Perform Home Exercise Program: with supervision, verbal cues required/provided PT Goal: Perform Home Exercise Program - Progress: Progressing toward goal  Visit Information  Last PT Received On: 02/20/12 Assistance Needed: +1    Subjective Data  Subjective: I'm doing good but my thigh is sore.  Patient Stated Goal: to get back home   Cognition  Overall Cognitive Status: Appears within functional limits for tasks assessed/performed Arousal/Alertness: Awake/alert Orientation Level: Appears intact for tasks assessed Behavior During Session: [facility] for tasks performed    Balance     End of Session PT - End of Session Activity Tolerance: Patient tolerated treatment well Patient left: in chair;with call bell/phone within reach;with family/visitor present    Page, Meribeth Mattes 02/20/2012, 2:25 PM

## 2012-02-20 NOTE — Evaluation (Signed)
Physical Therapy Evaluation Patient Details Name: Alison Chandler MRN: [id number] DOB: Jan 09, 1941 Today's Date: 02/20/2012 Time: 4098-1191 PT Time Calculation (min): 34 min  PT Assessment / Plan / Recommendation Clinical Impression  Pt presents s/p R THR (ant) POD 1 with decreased strength, ROM and mobility in RLE.  Tolerated ambulation very well in hallway with RW and step through gait pattern.  Pt will benefit from skilled PT in acute venue to address deficits.  PT recommends HHPT for follow up therapy to return pt to PLOF.      PT Assessment  Patient needs continued PT services    Follow Up Recommendations  Home health PT    Barriers to Discharge None      lEquipment Recommendations  Rolling walker with 5" wheels (may be able to get RW from neighbor, will let PT know)    Recommendations for Other Services     Frequency 7X/week    Precautions / Restrictions Precautions Precautions: None Restrictions Weight Bearing Restrictions: No   Pertinent Vitals/Pain 1/10      Mobility  Bed Mobility Bed Mobility: Supine to Sit;Sitting - Scoot to Edge of Bed Supine to Sit: 5: Supervision Sitting - Scoot to Edge of Bed: 5: Supervision Details for Bed Mobility Assistance: Supervision for safety with min cues for technique.  Transfers Transfers: Sit to Stand;Stand to Sit Sit to Stand: 4: Min guard;With upper extremity assist;From bed;From elevated surface Stand to Sit: 5: Supervision;With upper extremity assist;With armrests;To chair/3-in-1 Details for Transfer Assistance: Performed transfer x 2 to/from bed and toilet.  Cues for hand placement and safety.  Ambulation/Gait Ambulation/Gait Assistance: 5: Supervision Ambulation Distance (Feet): 200 Feet Assistive device: Rolling walker Ambulation/Gait Assistance Details: Min cues for sequencing/technique with RW, esp with turns and for posture.  Pt initially started with step to gait pattern and transitioned to step through.  Gait Pattern:  Step-to pattern;Step-through pattern Gait velocity: WFL Stairs: No Wheelchair Mobility Wheelchair Mobility: No    Exercises     PT Diagnosis: Difficulty walking;Generalized weakness  PT Problem List: Decreased strength;Decreased range of motion;Decreased mobility;Decreased activity tolerance;Decreased knowledge of use of DME PT Treatment Interventions: DME instruction;Gait training;Stair training;Functional mobility training;Therapeutic activities;Therapeutic exercise;Balance training;Patient/family education   PT Goals Acute Rehab PT Goals PT Goal Formulation: With patient Time For Goal Achievement: 02/22/12 Potential to Achieve Goals: Good Pt will go Sit to Supine/Side: with supervision PT Goal: Sit to Supine/Side - Progress: Goal set today Pt will Ambulate: >150 feet;with modified independence;with least restrictive assistive device PT Goal: Ambulate - Progress: Goal set today Pt will Go Up / Down Stairs: 1-2 stairs;with supervision;with least restrictive assistive device PT Goal: Up/Down Stairs - Progress: Goal set today Pt will Perform Home Exercise Program: with supervision, verbal cues required/provided PT Goal: Perform Home Exercise Program - Progress: Goal set today  Visit Information  Last PT Received On: 02/20/12 Assistance Needed: +1    Subjective Data  Subjective: I'm doing ok Patient Stated Goal: to get back home   Prior Functioning  Home Living Lives With: Spouse Available Help at Discharge: Family Type of Home: House Home Access: Stairs to enter Secretary/administrator of Steps: 1 Entrance Stairs-Rails: None Home Layout: One level Bathroom Shower/Tub: Health visitor: Standard Home Adaptive Equipment: Shower chair without back;Straight cane Additional Comments: toilet riser Prior Function Level of Independence: Independent Able to Take Stairs?: Yes Driving: Yes Vocation: Retired Musician: No difficulties      Cognition  Overall Cognitive Status: Appears within functional limits for tasks  assessed/performed Arousal/Alertness: Awake/alert Orientation Level: Appears intact for tasks assessed Behavior During Session: [facility] for tasks performed    Extremity/Trunk Assessment Right Lower Extremity Assessment RLE ROM/Strength/Tone: Deficits RLE ROM/Strength/Tone Deficits: able to get RLE out of bed (hip add) without assist, anke motions WFL RLE Sensation: WFL - Light Touch RLE Coordination: WFL - gross motor Left Lower Extremity Assessment LLE ROM/Strength/Tone: WFL for tasks assessed LLE Sensation: WFL - Light Touch LLE Coordination: WFL - gross motor Trunk Assessment Trunk Assessment: Normal   Balance Balance Balance Assessed: Yes Static Standing Balance Static Standing - Balance Support: During functional activity;No upper extremity supported Static Standing - Level of Assistance: 5: Stand by assistance Static Standing - Comment/# of Minutes: Pt speaking with MD and took B UE off of RW and able to stand at stand by assist for safety x approx 2 mins.   End of Session PT - End of Session Activity Tolerance: Patient tolerated treatment well Patient left: in chair;with call bell/phone within reach Nurse Communication: Mobility status   Page, Meribeth Mattes 02/20/2012, 8:54 AM

## 2012-02-20 NOTE — Progress Notes (Signed)
Subjective: 1 Day Post-Op Procedure(s) (LRB): TOTAL HIP ARTHROPLASTY ANTERIOR APPROACH (Right) Patient reports pain as mild.    Objective: Vital signs in last 24 hours: Temp:  [94.1 F (34.5 C)-98 F (36.7 C)] 97.7 F (36.5 C) (06/22 0634) Pulse Rate:  [53-105] 69  (06/22 0634) Resp:  [10-20] 16  (06/22 0634) BP: (89-182)/(53-109) 131/66 mmHg (06/22 0634) SpO2:  [95 %-100 %] 100 % (06/22 0634) Weight:  [97.523 kg (215 lb)] 97.523 kg (215 lb) (06/21 1652)  Intake/Output from previous day: 06/21 0701 - 06/22 0700 In: 4142.5 [P.O.:360; I.V.:3677.5; IV Piggyback:105] Out: 2630 [Urine:2130; Blood:500] Intake/Output this shift:     Basename 02/20/12 0502  HGB 11.4*    Basename 02/20/12 0502  WBC 11.8*  RBC 3.80*  HCT 35.2*  PLT 289    Basename 02/20/12 0502  NA 135  K 3.5  CL 101  CO2 25  BUN 15  CREATININE 0.58  GLUCOSE 150*  CALCIUM 9.5   No results found for this basename: LABPT:2,INR:2 in the last 72 hours  Sensation intact distally Intact pulses distally Dorsiflexion/Plantar flexion intact Incision: scant drainage  Assessment/Plan: 1 Day Post-Op Procedure(s) (LRB): TOTAL HIP ARTHROPLASTY ANTERIOR APPROACH (Right) Up with therapy  Kristee Angus Y 02/20/2012, 8:29 AM

## 2012-02-20 NOTE — Progress Notes (Signed)
[name] spoke with patient concerning dc planning. Pt offered choice, per pt choice [facility] to provide [facility] services. Pt has access to RW,Cane, BSC. Spouse present at bedside to assist in home care. No further HH services or DME requested.   Alison Chandler [phone]

## 2012-02-21 LAB — CBC
Hemoglobin: 10.1 g/dL — ABNORMAL LOW (ref 12.0–15.0)
MCV: 90.9 fL (ref 78.0–100.0)
Platelets: 229 10*3/uL (ref 150–400)
RBC: 3.28 MIL/uL — ABNORMAL LOW (ref 3.87–5.11)
WBC: 10.8 10*3/uL — ABNORMAL HIGH (ref 4.0–10.5)

## 2012-02-21 MED ORDER — ASPIRIN 325 MG PO TBEC: 325.0000 mg | DELAYED_RELEASE_TABLET | Freq: Two times a day (BID) | ORAL | Status: AC

## 2012-02-21 MED ORDER — METHOCARBAMOL 500 MG PO TABS: 500.0000 mg | ORAL_TABLET | Freq: Four times a day (QID) | ORAL | Status: AC | PRN

## 2012-02-21 MED ORDER — OXYCODONE-ACETAMINOPHEN 5-325 MG PO TABS: 1.0000 | ORAL_TABLET | ORAL | Status: AC | PRN

## 2012-02-21 NOTE — Discharge Summary (Signed)
Patient ID: Alison Chandler MRN: [id number] DOB/AGE: December 09, 1940 71 y.o.  Admit date: 02/19/2012 Discharge date: 02/21/2012  Admission Diagnoses:  Principal Problem:  *Degenerative arthritis of hip   Discharge Diagnoses:  Same  Past Medical History  Diagnosis Date  . Arthritis   . GERD (gastroesophageal reflux disease)     Surgeries: Procedure(s): TOTAL HIP ARTHROPLASTY ANTERIOR APPROACH on 02/19/2012   Consultants:    Discharged Condition: Improved  Hospital Course: Alison Chandler is an 71 y.o. female who was admitted 02/19/2012 for operative treatment ofDegenerative arthritis of hip. Patient has severe unremitting pain that affects sleep, daily activities, and work/hobbies. After pre-op clearance the patient was taken to the operating room on 02/19/2012 and underwent  Procedure(s): TOTAL HIP ARTHROPLASTY ANTERIOR APPROACH.    Patient was given perioperative antibiotics: Anti-infectives     Start     Dose/Rate Route Frequency Ordered Stop   02/19/12 1830   ceFAZolin (ANCEF) IVPB 1 g/50 mL premix        1 g 100 mL/hr over 30 Minutes Intravenous Every 6 hours 02/19/12 1706 16-Mar-2012 0040   02/19/12 1006   ceFAZolin (ANCEF) IVPB 2 g/50 mL premix  Status:  Discontinued        2 g 100 mL/hr over 30 Minutes Intravenous 60 min pre-op 02/19/12 1006 02/19/12 1641           Patient was given sequential compression devices, early ambulation, and chemoprophylaxis to prevent DVT.  Patient benefited maximally from hospital stay and there were no complications.    Recent vital signs: Patient Vitals for the past 24 hrs:  BP Temp Temp src Pulse Resp SpO2  02/21/12 0627 134/75 mmHg 97.7 F (36.5 C) Oral 90  17  96 %  03/16/12 2111 115/75 mmHg 98.4 F (36.9 C) Oral 76  16  99 %  March 16, 2012 1600 - - - - 16  98 %  03/16/2012 1450 120/78 mmHg 97.7 F (36.5 C) Oral 84  16  98 %  03/16/12 1200 - - - - 18  98 %     Recent laboratory studies:  Basename 02/21/12 0446 03/16/2012 0502  WBC 10.8*  11.8*  HGB 10.1* 11.4*  HCT 29.8* 35.2*  PLT 229 289  NA -- 135  K -- 3.5  CL -- 101  CO2 -- 25  BUN -- 15  CREATININE -- 0.58  GLUCOSE -- 150*  INR -- --  CALCIUM -- 9.5     Discharge Medications:   Medication List  As of 02/21/2012  8:37 AM   TAKE these medications         acetaminophen 500 MG tablet   Commonly known as: TYLENOL   Take 500-1,000 mg by mouth every 6 (six) hours as needed. For pain      aspirin 325 MG EC tablet   Take 1 tablet (325 mg total) by mouth 2 (two) times daily.      atorvastatin 20 MG tablet   Commonly known as: LIPITOR   Take 20 mg by mouth at bedtime.      CALCIUM 600 PO   Take 1 tablet by mouth daily with breakfast. Pt says calcium citrate      cholecalciferol 1000 UNITS tablet   Commonly known as: VITAMIN D   Take 5,000 Units by mouth daily with breakfast.      estradiol-norethindrone 0.05-0.14 MG/DAY   Commonly known as: COMBIPATCH   Place 1 patch onto the skin once a week. On Mondays  fish oil-omega-3 fatty acids 1000 MG capsule   Take 1 g by mouth daily with breakfast.      glucosamine-chondroitin 500-400 MG tablet   Take 1 tablet by mouth daily with breakfast.      methocarbamol 500 MG tablet   Commonly known as: ROBAXIN   Take 1 tablet (500 mg total) by mouth every 6 (six) hours as needed.      naproxen sodium 220 MG tablet   Commonly known as: ANAPROX   Take 220-440 mg by mouth every 12 (twelve) hours as needed. For pain      oxyCODONE-acetaminophen 5-325 MG per tablet   Commonly known as: PERCOCET   Take 1-2 tablets by mouth every 4 (four) hours as needed for pain.            Diagnostic Studies: Dg Hip Complete Right  02/19/2012  *RADIOLOGY REPORT*  Clinical Data: Right hip arthroplasty.  OPERATIVE RIGHT HIP - COMPLETE 2+ VIEW  Comparison: None.  Findings: Two spot images from the C-arm fluoroscopic device submitted for interpretation post-operatively demonstrates right hip arthroplasty with anatomic alignment.   IMPRESSION: Anatomic alignment post right hip arthroplasty.  Original Report Authenticated By: Arnell Sieving, M.D.   Dg Pelvis Portable  02/19/2012  *RADIOLOGY REPORT*  Clinical Data: Postoperative examination (a right total hip replacement  PORTABLE PELVIS  Comparison: Lateral radiographs of the right hip - earlier same day  Findings:  Examination interpreted in conjunction with lateral hip radiographs performed earlier same day.  Post right total hip replacement without evidence of hardware failure or loosening.  Anatomic positioning.  No fracture.  There is expected subcutaneous emphysema about the operative site. Surgical clips are seen about the lateral aspect of the thigh.  Lumbar spine degenerative change is suspected.  IMPRESSION: Post right total hip replacement without evidence of complication.  Original Report Authenticated By: Waynard Reeds, M.D.   Dg Hip Portable 1 View Right  02/19/2012  *RADIOLOGY REPORT*  Clinical Data: Postop (right total hip replacement)  PORTABLE RIGHT HIP - 1 VIEW  Comparison: AP pelvic radiographs - earlier same day  Findings:  Examination interpreted in conjunction with AP radiographs performed earlier same day.  Post right total hip replacement without evidence of hardware failure or loosening.  Anatomic positioning.  No fracture.  There is expected subcutaneous emphysema about the operative site. Surgical clips are seen about the lateral aspect of the thigh.  Lumbar spine degenerative changes suspected.  IMPRESSION: Post right total hip replacement without evidence of complication.  Original Report Authenticated By: Waynard Reeds, M.D.   Dg C-arm 61-120 Min-no Report  02/19/2012  CLINICAL DATA: Hip Film   C-ARM 61-120 MINUTES  Fluoroscopy was utilized by the requesting physician.  No radiographic  interpretation.      Disposition: To home  Discharge Orders    Future Orders Please Complete By Expires   Diet - low sodium heart healthy      Call MD /  Call 911      Comments:   If you experience chest pain or shortness of breath, CALL 911 and be transported to the hospital emergency room.  If you develope a fever above 101 F, pus (white drainage) or increased drainage or redness at the wound, or calf pain, call your surgeon's office.   Constipation Prevention      Comments:   Drink plenty of fluids.  Prune juice may be helpful.  You may use a stool softener, such as Colace (  over the counter) 100 mg twice a day.  Use MiraLax (over the counter) for constipation as needed.   Increase activity slowly as tolerated      Discharge instructions      Comments:   Increase your activities as comfort allows. You can get your current dressing wet in the shower. You can get your actual incision wet starting 6/26. Dry dressing daily after that and keep grown part of incision dry. Follow-up in 2 weeks at [facility] [phone].   Discharge patient            Signed: Kathryne Hitch 02/21/2012, 8:37 AM

## 2012-02-21 NOTE — Evaluation (Signed)
Occupational Therapy Evaluation Patient Details Name: Alison Chandler MRN: [id number] DOB: 09-Oct-1940 Today's Date: 02/21/2012 Time: 1030-1059 OT Time Calculation (min): 29 min  OT Assessment / Plan / Recommendation Clinical Impression  Pt presents to OT s/p THA with decreased I with ADL activity, but is doing well. All OT education complete    OT Assessment  Patient does not need any further OT services    Follow Up Recommendations  No OT follow up       Equipment Recommendations  None recommended by OT          Precautions / Restrictions Precautions Precautions: None Restrictions Weight Bearing Restrictions: No       ADL  Grooming: Performed;Teeth care Where Assessed - Grooming: Supported standing;Other (comment) (walker) Upper Body Bathing: Simulated Where Assessed - Upper Body Bathing: Unsupported sitting Lower Body Bathing: Simulated;Minimal assistance Where Assessed - Lower Body Bathing: Supported sit to stand Upper Body Dressing: Performed;Set up Where Assessed - Upper Body Dressing: Unsupported sitting Lower Body Dressing: Performed;Minimal assistance;Other (comment) (with AE) Where Assessed - Lower Body Dressing: Sopported sit to stand Toilet Transfer: Buyer, retail Method: Sit to Barista: Comfort height toilet;Grab bars Toileting - Architect and Hygiene: Performed;Supervision/safety Where Assessed - Engineer, mining and Hygiene: Standing Tub/Shower Transfer: Performed;Min guard Web designer Method: Science writer: Grab bars;Walk in shower Equipment Used: Reacher;Sock aid ADL Comments: husband will assist as needed        Visit Information  Last OT Received On: 02/21/12 Assistance Needed: +1       Prior Functioning  Home Living Lives With: Spouse Available Help at Discharge: Family Type of Home: House Home Access: Stairs to  enter Secretary/administrator of Steps: 1 Entrance Stairs-Rails: None Home Layout: One level Bathroom Shower/Tub: Health visitor: Standard Home Adaptive Equipment: Paediatric nurse without back;Straight cane Additional Comments: toilet riser Prior Function Level of Independence: Independent Able to Take Stairs?: Yes Driving: Yes Vocation: Retired Musician: No difficulties    Cognition  Overall Cognitive Status: Appears within functional limits for tasks assessed/performed Arousal/Alertness: Awake/alert Orientation Level: Appears intact for tasks assessed Behavior During Session: [facility] for tasks performed    Extremity/Trunk Assessment Right Upper Extremity Assessment RUE ROM/Strength/Tone: [facility] for tasks assessed Left Upper Extremity Assessment LUE ROM/Strength/Tone: WFL for tasks assessed   Mobility Bed Mobility Bed Mobility: Supine to Sit Supine to Sit: 4: Min assist Details for Bed Mobility Assistance: Some assist for RLE due to increased pain this morning.  Pt demos good UE technique.  Transfers Transfers: Sit to Stand;Stand to Sit Sit to Stand: From toilet;5: Supervision;With upper extremity assist Stand to Sit: To chair/3-in-1;5: Supervision;With upper extremity assist Details for Transfer Assistance: Min cues for technique.          End of Session OT - End of Session Activity Tolerance: Patient tolerated treatment well Patient left: in chair;with call bell/phone within reach   [facility], [place] Kung 02/21/2012, 12:32 PM

## 2012-02-21 NOTE — Progress Notes (Signed)
Patient ID: Alison Chandler, female   DOB: 1940-10-23, 71 y.o.   MRN: [id number] Looks good.  Can discharge to home today.

## 2012-02-21 NOTE — Progress Notes (Signed)
Discharged from floor via w/c, family with pt. No changes in assessment. Alison Chandler  

## 2012-02-21 NOTE — Progress Notes (Signed)
Physical Therapy Treatment Patient Details Name: Alison Chandler MRN: [id number] DOB: 10/22/1940 Today's Date: 02/21/2012 Time: 6295-2841 PT Time Calculation (min): 18 min  PT Assessment / Plan / Recommendation Comments on Treatment Session  Pt continues to do well with ambulation and stair training, however with increased soreness this morning.  Pt to D/C today.     Follow Up Recommendations  Home health PT    Barriers to Discharge        Equipment Recommendations  None recommended by PT    Recommendations for Other Services    Frequency 7X/week   Plan Discharge plan remains appropriate    Precautions / Restrictions Precautions Precautions: None Restrictions Weight Bearing Restrictions: No   Pertinent Vitals/Pain 5/10    Mobility  Bed Mobility Bed Mobility: Supine to Sit Supine to Sit: 4: Min assist Details for Bed Mobility Assistance: Some assist for RLE due to increased pain this morning.  Pt demos good UE technique.  Transfers Transfers: Sit to Stand;Stand to Sit Sit to Stand: 5: Supervision;With upper extremity assist;From bed;From elevated surface Stand to Sit: 5: Supervision;With upper extremity assist;With armrests;To chair/3-in-1 Details for Transfer Assistance: Min cues for technique.  Ambulation/Gait Ambulation/Gait Assistance: 6: Modified independent (Device/Increase time) Ambulation Distance (Feet): 300 Feet Assistive device: Rolling walker Ambulation/Gait Assistance Details: Pt doing very well with step through technique.  No cuing needed.  Gait Pattern: Step-through pattern Gait velocity: WFL Stairs: Yes Stairs Assistance: 5: Supervision Stairs Assistance Details (indicate cue type and reason): Cues for sequencing/technique with RW.   Stair Management Technique: No rails;Backwards;Step to pattern;With walker Number of Stairs: 1  Wheelchair Mobility Wheelchair Mobility: No    Exercises     PT Diagnosis:    PT Problem List:   PT Treatment  Interventions:     PT Goals Acute Rehab PT Goals PT Goal Formulation: With patient Time For Goal Achievement: 02/22/12 Potential to Achieve Goals: Good Pt will Ambulate: >150 feet;with modified independence;with least restrictive assistive device PT Goal: Ambulate - Progress: Met Pt will Go Up / Down Stairs: 1-2 stairs;with supervision;with least restrictive assistive device PT Goal: Up/Down Stairs - Progress: Met Pt will Perform Home Exercise Program: with supervision, verbal cues required/provided PT Goal: Perform Home Exercise Program - Progress: Partly met  Visit Information  Last PT Received On: 02/21/12 Assistance Needed: +1    Subjective Data  Subjective: I had a horrible night Patient Stated Goal: to get back home   Cognition  Overall Cognitive Status: Appears within functional limits for tasks assessed/performed Arousal/Alertness: Awake/alert Orientation Level: Appears intact for tasks assessed Behavior During Session: [facility] for tasks performed    Balance     End of Session PT - End of Session Activity Tolerance: Patient limited by pain Patient left: in chair;with call bell/phone within reach;with family/visitor present Nurse Communication: Mobility status    Page, Meribeth Mattes 02/21/2012, 9:12 AM

## 2012-02-22 ENCOUNTER — Encounter (HOSPITAL_COMMUNITY): Payer: Self-pay | Admitting: Orthopaedic Surgery

## 2012-02-22 MED ORDER — CEFAZOLIN SODIUM 1-5 GM-% IV SOLN
INTRAVENOUS | Status: DC | PRN
  Administered 2012-02-19: 2 g via INTRAVENOUS

## 2012-02-22 NOTE — Addendum Note (Signed)
Addendum  created 02/22/12 1613 by Lattie Haw, CRNA   Modules edited:Anesthesia Medication Administration

## 2012-02-22 NOTE — Progress Notes (Signed)
Utilization review completed.  

## 2012-02-22 NOTE — Addendum Note (Signed)
Addendum  created 02/22/12 1613 by Jaliel Deavers Catherine Payne Unita Detamore, CRNA   Modules edited:Anesthesia Medication Administration    

## 2014-04-16 DIAGNOSIS — N201 Calculus of ureter: Secondary | ICD-10-CM | POA: Insufficient documentation

## 2014-04-16 DIAGNOSIS — M1610 Unilateral primary osteoarthritis, unspecified hip: Secondary | ICD-10-CM | POA: Insufficient documentation

## 2014-04-16 DIAGNOSIS — J309 Allergic rhinitis, unspecified: Secondary | ICD-10-CM | POA: Insufficient documentation

## 2014-04-16 DIAGNOSIS — H905 Unspecified sensorineural hearing loss: Secondary | ICD-10-CM | POA: Insufficient documentation

## 2014-04-16 DIAGNOSIS — R519 Headache, unspecified: Secondary | ICD-10-CM | POA: Insufficient documentation

## 2014-04-16 DIAGNOSIS — H509 Unspecified strabismus: Secondary | ICD-10-CM | POA: Insufficient documentation

## 2014-04-16 DIAGNOSIS — N3010 Interstitial cystitis (chronic) without hematuria: Secondary | ICD-10-CM | POA: Insufficient documentation

## 2014-04-16 DIAGNOSIS — M6580 Other synovitis and tenosynovitis, unspecified site: Secondary | ICD-10-CM | POA: Insufficient documentation

## 2014-04-16 DIAGNOSIS — E039 Hypothyroidism, unspecified: Secondary | ICD-10-CM | POA: Insufficient documentation

## 2014-04-16 DIAGNOSIS — L821 Other seborrheic keratosis: Secondary | ICD-10-CM | POA: Insufficient documentation

## 2014-04-16 DIAGNOSIS — M8580 Other specified disorders of bone density and structure, unspecified site: Secondary | ICD-10-CM | POA: Insufficient documentation

## 2014-04-16 DIAGNOSIS — N390 Urinary tract infection, site not specified: Secondary | ICD-10-CM | POA: Insufficient documentation

## 2014-04-16 DIAGNOSIS — D126 Benign neoplasm of colon, unspecified: Secondary | ICD-10-CM | POA: Insufficient documentation

## 2014-04-16 DIAGNOSIS — R310 Gross hematuria: Secondary | ICD-10-CM | POA: Insufficient documentation

## 2014-04-16 DIAGNOSIS — H9110 Presbycusis, unspecified ear: Secondary | ICD-10-CM | POA: Insufficient documentation

## 2014-04-16 DIAGNOSIS — H16229 Keratoconjunctivitis sicca, 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, unspecified eye: Secondary | ICD-10-CM | POA: Insufficient documentation

## 2014-04-16 DIAGNOSIS — R350 Frequency of micturition: Secondary | ICD-10-CM | POA: Insufficient documentation

## 2014-04-16 DIAGNOSIS — K219 Gastro-esophageal reflux disease without esophagitis: Secondary | ICD-10-CM | POA: Insufficient documentation

## 2014-04-16 DIAGNOSIS — IMO0002 Reserved for concepts with insufficient information to code with codable children: Secondary | ICD-10-CM | POA: Insufficient documentation

## 2014-05-10 DIAGNOSIS — N393 Stress incontinence (female) (male): Secondary | ICD-10-CM | POA: Insufficient documentation

## 2014-05-10 DIAGNOSIS — N8111 Cystocele, midline: Secondary | ICD-10-CM | POA: Insufficient documentation

## 2014-09-05 DIAGNOSIS — H50332 Intermittent monocular exotropia, left eye: Secondary | ICD-10-CM | POA: Insufficient documentation

## 2014-09-05 DIAGNOSIS — H5022 Vertical strabismus, left eye: Secondary | ICD-10-CM | POA: Insufficient documentation

## 2015-08-14 DIAGNOSIS — D249 Benign neoplasm of unspecified breast: Secondary | ICD-10-CM | POA: Insufficient documentation

## 2016-09-21 DIAGNOSIS — G5602 Carpal tunnel syndrome, left upper limb: Secondary | ICD-10-CM | POA: Insufficient documentation

## 2016-11-22 ENCOUNTER — Encounter (HOSPITAL_BASED_OUTPATIENT_CLINIC_OR_DEPARTMENT_OTHER): Payer: Self-pay | Admitting: *Deleted

## 2016-11-22 ENCOUNTER — Emergency Department (HOSPITAL_BASED_OUTPATIENT_CLINIC_OR_DEPARTMENT_OTHER)
Admission: EM | Admit: 2016-11-22 | Discharge: 2016-11-22 | Disposition: A | Discharge status: Home / Self Care | Payer: Medicare Other | Attending: Emergency Medicine | Admitting: Emergency Medicine

## 2016-11-22 DIAGNOSIS — Z79899 Other long term (current) drug therapy: Secondary | ICD-10-CM | POA: Diagnosis not present

## 2016-11-22 DIAGNOSIS — G8918 Other acute postprocedural pain: Secondary | ICD-10-CM | POA: Diagnosis present

## 2016-11-22 DIAGNOSIS — Z87891 Personal history of nicotine dependence: Secondary | ICD-10-CM | POA: Diagnosis not present

## 2016-11-22 HISTORY — DX: Calculus of kidney: N20.0

## 2016-11-22 LAB — URINALYSIS, ROUTINE W REFLEX MICROSCOPIC
Bilirubin Urine: NEGATIVE
GLUCOSE, UA: 100 mg/dL — AB
Ketones, ur: NEGATIVE mg/dL
LEUKOCYTES UA: NEGATIVE
Nitrite: NEGATIVE
PROTEIN: 100 mg/dL — AB
Specific Gravity, Urine: 1.019 (ref 1.005–1.030)
pH: 5 (ref 5.0–8.0)

## 2016-11-22 LAB — URINALYSIS, MICROSCOPIC (REFLEX)

## 2016-11-22 LAB — BASIC METABOLIC PANEL
ANION GAP: 11 (ref 5–15)
BUN: 20 mg/dL (ref 6–20)
CALCIUM: 10 mg/dL (ref 8.9–10.3)
CO2: 20 mmol/L — ABNORMAL LOW (ref 22–32)
Chloride: 102 mmol/L (ref 101–111)
Creatinine, Ser: 0.92 mg/dL (ref 0.44–1.00)
GFR calc Af Amer: >60 mL/min (ref >60)
GFR, EST NON AFRICAN AMERICAN: 59 mL/min — AB (ref >60)
GLUCOSE: 164 mg/dL — AB (ref 65–99)
Potassium: 3.8 mmol/L (ref 3.5–5.1)
SODIUM: 133 mmol/L — AB (ref 135–145)

## 2016-11-22 MED ORDER — NAPROXEN 250 MG PO TABS
500.0000 mg | ORAL_TABLET | Freq: Once | ORAL | Status: AC
  Administered 2016-11-22: 500 mg via ORAL
  Filled 2016-11-22: qty 2

## 2016-11-22 MED ORDER — ONDANSETRON 8 MG PO TBDP: 8.0000 mg | ORAL_TABLET | Freq: Three times a day (TID) | ORAL | 0 refills | Status: DC | PRN

## 2016-11-22 MED ORDER — HYDROCODONE-ACETAMINOPHEN 7.5-325 MG PO TABS: 1.0000 | ORAL_TABLET | ORAL | 0 refills | Status: DC | PRN

## 2016-11-22 NOTE — ED Triage Notes (Addendum)
c/o left flank pain that comes and goes since Thursday when she had a stent taken out to her left kidney  Dx with a 93mm left stone recently and  Pt had a laser lithotripsy. She  had a f/u on the 22nd and pt had a left stent (kidney) taken out. Denies any pain at present. States that she feels urinary frequency,but only urinating a small amount. Denies any nausea at present but states she has had nausea. Denies vomiting

## 2016-11-22 NOTE — ED Provider Notes (Addendum)
[facility] Provider Note: Georgena Spurling, MD, FACEP  CSN: [id number] MRN: [id number] ARRIVAL: 11/22/16 at [place]: [id number]   CHIEF COMPLAINT  Flank Pain   HISTORY OF PRESENT ILLNESS  Alison Chandler is a 76 y.o. female who was recently diagnosed with an 8 millimeter proximal left ureteral stone. She underwent cystoscopy with laser lithotripsy on March 16. A left renal stone was also broken up at the same time. She was discharged with a ureteral stent. This stent was removed on Thursday, March 22. A KUB showed no residual stone fragments. That evening she experienced a return of left flank pain which responded to hydrocodone and oxybutynin; that pain was moderate. She had a return of left flank pain yesterday evening about 10 PM. This pain was more severe and did not immediately respond to hydrocodone and oxybutynin. The pain waxed and waned and is currently not present. She was nauseated with this but did not vomit. She has noticed a trace of hematuria. She also does not believe she has been urinating as frequently as she should.  Consultation with the [facility] state controlled substances database reveals the System is off-line.   Past Medical History:  Diagnosis Date  . Arthritis   . GERD (gastroesophageal reflux disease)   . Kidney stones     Past Surgical History:  Procedure Laterality Date  . colonscopy  01-27-2012  . TOTAL HIP ARTHROPLASTY  02/19/2012   Procedure: TOTAL HIP ARTHROPLASTY ANTERIOR APPROACH;  Surgeon: Mcarthur Rossetti, MD;  Location: [facility] ORS;  Service: Orthopedics;  Laterality: Right;    No family history on file.  Social History  Substance Use Topics  . Smoking status: Former Smoker    Packs/day: 0.25    Years: 20.00    Quit date: 09/01/1971  . Smokeless tobacco: Never Used  . Alcohol use 0.6 oz/week    1 Cans of beer per week    Prior to Admission medications   Medication Sig Start Date End Date Taking? Authorizing Provider    cephALEXin (KEFLEX) 250 MG capsule Take by mouth daily.   Yes Historical Provider, MD  Hydrocodone-Acetaminophen (VICODIN PO) Take by mouth.   Yes Historical Provider, MD  Oxybutynin Chloride (DITROPAN PO) Take by mouth. TID   Yes Historical Provider, MD  acetaminophen (TYLENOL) 500 MG tablet Take 500-1,000 mg by mouth every 6 (six) hours as needed. For pain    Historical Provider, MD  atorvastatin (LIPITOR) 20 MG tablet Take 20 mg by mouth at bedtime.    Historical Provider, MD  Calcium Carbonate (CALCIUM 600 PO) Take 1 tablet by mouth daily with breakfast. Pt says calcium citrate    Historical Provider, MD  cholecalciferol (VITAMIN D) 1000 UNITS tablet Take 5,000 Units by mouth daily with breakfast.    Historical Provider, MD  estradiol-norethindrone [facility]) 0.05-0.14 MG/DAY Place 1 patch onto the skin once a week. On Mondays    Historical Provider, MD  fish oil-omega-3 fatty acids 1000 MG capsule Take 1 g by mouth daily with breakfast.    Historical Provider, MD  glucosamine-chondroitin 500-400 MG tablet Take 1 tablet by mouth daily with breakfast.    Historical Provider, MD  naproxen sodium (ANAPROX) 220 MG tablet Take 220-440 mg by mouth every 12 (twelve) hours as needed. For pain    Historical Provider, MD    Allergies Patient has no known allergies.   REVIEW OF SYSTEMS  Negative except as noted here or in the History of Present Illness.   PHYSICAL  EXAMINATION  Initial Vital Signs Blood pressure (!) 166/96, pulse 82, temperature 97.5 F (36.4 C), resp. rate 18, height 5\' 9"  (1.753 m), weight 215 lb (97.5 kg), SpO2 99 %.  Examination General: Well-developed, well-nourished female in no acute distress; appearance consistent with age of record HENT: normocephalic; atraumatic Eyes: pupils equal, round and reactive to light; extraocular muscles intact Neck: supple Heart: regular rate and rhythm Lungs: clear to auscultation bilaterally Abdomen: soft; nondistended; nontender;  no masses or hepatosplenomegaly; bowel sounds present GU: No CVA tenderness Extremities: No deformity; full range of motion; pulses normal Neurologic: Awake, alert and oriented; motor function intact in all extremities and symmetric; no facial droop Skin: Warm and dry Psychiatric: Normal mood and affect   RESULTS  Summary of this visit's results, reviewed by myself:   EKG Interpretation  Date/Time:    Ventricular Rate:    PR Interval:    QRS Duration:   QT Interval:    QTC Calculation:   R Axis:     Text Interpretation:        Laboratory Studies: Results for orders placed or performed during the hospital encounter of 11/22/16 (from the past 24 hour(s))  Urinalysis, Routine w reflex microscopic     Status: Abnormal   Collection Time: 11/22/16  4:14 AM  Result Value Ref Range   Color, Urine YELLOW YELLOW   APPearance CLEAR CLEAR   Specific Gravity, Urine 1.019 1.005 - 1.030   pH 5.0 5.0 - 8.0   Glucose, UA 100 (A) NEGATIVE mg/dL   Hgb urine dipstick LARGE (A) NEGATIVE   Bilirubin Urine NEGATIVE NEGATIVE   Ketones, ur NEGATIVE NEGATIVE mg/dL   Protein, ur 100 (A) NEGATIVE mg/dL   Nitrite NEGATIVE NEGATIVE   Leukocytes, UA NEGATIVE NEGATIVE  Urinalysis, Microscopic (reflex)     Status: Abnormal   Collection Time: 11/22/16  4:14 AM  Result Value Ref Range   RBC / HPF TOO NUMEROUS TO COUNT 0 - 5 RBC/hpf   WBC, UA 0-5 0 - 5 WBC/hpf   Bacteria, UA FEW (A) NONE SEEN   Squamous Epithelial / LPF 0-5 (A) NONE SEEN  Basic metabolic panel     Status: Abnormal   Collection Time: 11/22/16  4:47 AM  Result Value Ref Range   Sodium 133 (L) 135 - 145 mmol/L   Potassium 3.8 3.5 - 5.1 mmol/L   Chloride 102 101 - 111 mmol/L   CO2 20 (L) 22 - 32 mmol/L   Glucose, Bld 164 (H) 65 - 99 mg/dL   BUN 20 6 - 20 mg/dL   Creatinine, Ser 0.92 0.44 - 1.00 mg/dL   Calcium 10.0 8.9 - 10.3 mg/dL   GFR calc non Af Amer 59 (L) >60 mL/min   GFR calc Af Amer >60 >60 mL/min   Anion gap 11 5 - 15    Imaging Studies: No results found.  ED COURSE  Nursing notes and initial vitals signs, including pulse oximetry, reviewed.  Vitals:   11/22/16 0424  BP: (!) 166/96  Pulse: 82  Resp: 18  Temp: 97.5 F (36.4 C)  SpO2: 99%  Weight: 215 lb (97.5 kg)  Height: 5\' 9"  (1.753 m)   5:16 AM The patient has remained pain-free in the ED. She does have significant hematuria. I suspect her pain is due to passage of residual clot or possibly a small stone fragment. She is requesting a refill of her hydrocodone as she is almost out. She has been taking MiraLAX with the hydrocodone to  help prevent constipation. She was advised to keep well-hydrated to help prevent recurrence of blood clots.  PROCEDURES    ED DIAGNOSES     ICD-9-CM ICD-10-CM   1. Acute postoperative pain 338.18 G89.18        Shanon Rosser, MD 11/22/16 0174    Shanon Rosser, MD 11/22/16 [place], MD 11/22/16 [phone]

## 2016-12-03 DIAGNOSIS — M810 Age-related osteoporosis without current pathological fracture: Secondary | ICD-10-CM | POA: Insufficient documentation

## 2016-12-03 DIAGNOSIS — N200 Calculus of kidney: Secondary | ICD-10-CM | POA: Insufficient documentation

## 2016-12-06 DIAGNOSIS — R251 Tremor, unspecified: Secondary | ICD-10-CM | POA: Insufficient documentation

## 2016-12-06 DIAGNOSIS — R202 Paresthesia of skin: Secondary | ICD-10-CM | POA: Insufficient documentation

## 2017-03-11 DIAGNOSIS — G609 Hereditary and idiopathic neuropathy, unspecified: Secondary | ICD-10-CM | POA: Insufficient documentation

## 2017-03-11 DIAGNOSIS — R2689 Other abnormalities of gait and mobility: Secondary | ICD-10-CM | POA: Insufficient documentation

## 2017-03-23 DIAGNOSIS — D329 Benign neoplasm of meninges, unspecified: Secondary | ICD-10-CM | POA: Insufficient documentation

## 2017-03-23 DIAGNOSIS — G319 Degenerative disease of nervous system, unspecified: Secondary | ICD-10-CM | POA: Insufficient documentation

## 2017-05-26 ENCOUNTER — Ambulatory Visit (INDEPENDENT_AMBULATORY_CARE_PROVIDER_SITE_OTHER): Payer: Medicare Other | Admitting: Orthopaedic Surgery

## 2017-05-26 ENCOUNTER — Ambulatory Visit (INDEPENDENT_AMBULATORY_CARE_PROVIDER_SITE_OTHER): Payer: Medicare Other

## 2017-05-26 DIAGNOSIS — M25562 Pain in left knee: Secondary | ICD-10-CM

## 2017-05-26 DIAGNOSIS — G8929 Other chronic pain: Secondary | ICD-10-CM

## 2017-05-26 DIAGNOSIS — M25572 Pain in left ankle and joints of left foot: Secondary | ICD-10-CM | POA: Diagnosis not present

## 2017-05-26 MED ORDER — LIDOCAINE HCL 1 % IJ SOLN
3.0000 mL | INTRAMUSCULAR | Status: AC | PRN
  Administered 2017-05-26: 3 mL

## 2017-05-26 MED ORDER — METHYLPREDNISOLONE ACETATE 40 MG/ML IJ SUSP
40.0000 mg | INTRAMUSCULAR | Status: AC | PRN
  Administered 2017-05-26: 40 mg via INTRA_ARTICULAR

## 2017-05-26 NOTE — Progress Notes (Signed)
Office Visit Note   Patient: Alison Chandler           Date of Birth: [birth year]           MRN: [id number] Visit Date: 05/26/2017              Requested by: Verdell Carmine., MD [address] [place], [place] [address] PCP: Verdell Carmine., MD   Assessment & Plan: Visit Diagnoses:  1. Pain in left ankle and joints of left foot   2. Chronic pain of left knee     Plan: I do feel that she would benefit from steroid injections from the pes bursa and in the ankle. She agrees with this as well. She tolerated the injections and understands the full rationale behind injections as well as the risk and benefits. I also feel that she'll benefit from physical therapy and I gave her prescription for this for a going over to [place] she is from. I'll see her back in 4 weeks to see how she is done overall. All questions and concerns were answered and addressed.  Follow-Up Instructions: Return in about 4 weeks (around 06/23/2017).   Orders:  Orders Placed This Encounter  Procedures  . Large Joint Injection/Arthrocentesis  . Medium Joint Injection/Arthrocentesis  . XR Ankle Complete Left  . XR Knee 1-2 Views Left   No orders of the defined types were placed in this encounter.     Procedures: Large Joint Inj Date/Time: 05/26/2017 6:25 PM Performed by: Mcarthur Rossetti Authorized by: Mcarthur Rossetti   Location:  Knee Site:  L knee Ultrasound Guidance: No   Fluoroscopic Guidance: No   Arthrogram: No   Medications:  3 mL lidocaine 1 %; 40 mg methylPREDNISolone acetate 40 MG/ML Medium Joint Inj Date/Time: 05/26/2017 6:25 PM Performed by: Mcarthur Rossetti Authorized by: Mcarthur Rossetti   Location:  Ankle Site:  L ankle     Clinical Data: No additional findings.   Subjective: No chief complaint on file. The patient is listed as a new patient because I haven't seen her long period time. She has a history of a total hip arthroplasty performed  many years ago. She comes in with a chief complaint of left knee pain in left ankle pain. She had been to physical therapy due to balance issues and that develop pain in her knee afterwards so she stopped therapy. She points the medial aspect of her knee and she is marked a spot near the pes bursa. She denies any locking catching in her knee and does point to the iliotibial band on the left side also source of her pain. She denies any swelling. She's also had problems in her left ankle she points the anterior aspect of her left ankle source of her pain. He does remember injuring her ankle but had a problem getting up from the floor 1 night and pushing off of that ankle and hurt her quite a bit and is been hurting since then with the extremes of dorsiflexion of her ankle and somewhat she walks as well. She denies any groin pain and says the hip replacement itself is doing well.  HPI  Review of Systems She denies any headache, chest pain, short of breath, fever, chills, nausea, vomiting.  Objective: Vital Signs: There were no vitals taken for this visit.  Physical Exam She is alert and oriented 3 and in no acute distress Ortho Exam Examination of her left hip shows a normal  hip exam in terms of the ball-and-socket joint. She has some pain of the trochanteric area but she definitely has pain over the iliotibial band. As far as her left knee goes she has only tenderness over the pes bursa area medially. The knee feels ligamentously stable and has good range of motion. Examination of her left ankle shows full range of motion with no swelling. Her ankles ligaments stable and is neurovascularly intact. She does show some signs of anterior impingement on exam. Specialty Comments:  No specialty comments available.  Imaging: Xr Ankle Complete Left  Result Date: 05/26/2017 3 views left ankle show no acute findings.  Xr Knee 1-2 Views Left  Result Date: 05/26/2017 2 views of left knee show no acute  findings. There is mild to moderate arthritic changes with good alignment.    PMFS History: Patient Active Problem List   Diagnosis Date Noted  . Degenerative arthritis of hip 02/19/2012   Past Medical History:  Diagnosis Date  . Arthritis   . GERD (gastroesophageal reflux disease)   . Kidney stones     No family history on file.  Past Surgical History:  Procedure Laterality Date  . colonscopy  01-27-2012  . TOTAL HIP ARTHROPLASTY  02/19/2012   Procedure: TOTAL HIP ARTHROPLASTY ANTERIOR APPROACH;  Surgeon: Mcarthur Rossetti, MD;  Location: [facility];  Service: Orthopedics;  Laterality: Right;   Social History   Occupational History  . Not on file.   Social History Main Topics  . Smoking status: Former Smoker    Packs/day: 0.25    Years: 20.00    Quit date: 09/01/1971  . Smokeless tobacco: Never Used  . Alcohol use 0.6 oz/week    1 Cans of beer per week  . Drug use: No  . Sexual activity: Not on file

## 2017-06-28 ENCOUNTER — Ambulatory Visit (INDEPENDENT_AMBULATORY_CARE_PROVIDER_SITE_OTHER): Payer: Medicare Other | Admitting: Orthopaedic Surgery

## 2017-06-30 ENCOUNTER — Ambulatory Visit (INDEPENDENT_AMBULATORY_CARE_PROVIDER_SITE_OTHER): Payer: Medicare Other | Admitting: Orthopaedic Surgery

## 2017-07-05 ENCOUNTER — Ambulatory Visit (INDEPENDENT_AMBULATORY_CARE_PROVIDER_SITE_OTHER): Payer: Medicare Other | Admitting: Orthopaedic Surgery

## 2017-07-28 ENCOUNTER — Ambulatory Visit (INDEPENDENT_AMBULATORY_CARE_PROVIDER_SITE_OTHER): Payer: Medicare Other | Admitting: Orthopaedic Surgery

## 2017-08-11 ENCOUNTER — Ambulatory Visit (INDEPENDENT_AMBULATORY_CARE_PROVIDER_SITE_OTHER): Payer: Medicare Other | Admitting: Orthopaedic Surgery

## 2017-08-11 ENCOUNTER — Encounter (INDEPENDENT_AMBULATORY_CARE_PROVIDER_SITE_OTHER): Payer: Self-pay | Admitting: Orthopaedic Surgery

## 2017-08-11 DIAGNOSIS — M7632 Iliotibial band syndrome, left leg: Secondary | ICD-10-CM | POA: Insufficient documentation

## 2017-08-11 NOTE — Progress Notes (Signed)
Patient is well-known to me.  She has known Pes we gave her a therapy prescription but she says she is not will start after the first of the year.  On examination of her left lower extremity she has minimal pain over the pes bursa with no knee pain.  Her pain is along the quad tendon and the iliotibial band.  Bursitis of her left knee and tolerated a steroid injection well in the pes bursa area.  She now points to her quad tendon as well as her iliotibial band source of her pain on that left side.  She is a history of right total hip arthroplasty.  She has weak quad muscles as a result of this as well.  Her hip exam is otherwise normal in terms of range of motion the hip and no pain in the groin at all.  She has no knee effusion either on the left side.  [name] is full of the left knee.  At this point I do feel that she would benefit from formal physical therapy on her left lower extremity including physical therapy trying any modalities to decrease the pain over the iliotibial band and quad tendon as well as strengthening exercises and any modalities that will help improve her function.  I do not need to really see her back for 3 months otherwise.

## 2017-09-30 ENCOUNTER — Telehealth (INDEPENDENT_AMBULATORY_CARE_PROVIDER_SITE_OTHER): Payer: Self-pay | Admitting: Orthopaedic Surgery

## 2017-09-30 NOTE — Telephone Encounter (Signed)
Alison Chandler (PT) with [facility] out patient rehab called needing clarification on diag. On Rx. She advised the Rx is from December.The number to contact Alison Chandler is [phone]

## 2017-10-01 NOTE — Telephone Encounter (Signed)
IT Band syndrome, Sonia Baller aware

## 2017-10-08 DIAGNOSIS — R4189 Other symptoms and signs involving cognitive functions and awareness: Secondary | ICD-10-CM | POA: Insufficient documentation

## 2017-10-19 ENCOUNTER — Telehealth (INDEPENDENT_AMBULATORY_CARE_PROVIDER_SITE_OTHER): Payer: Self-pay

## 2017-10-19 NOTE — Telephone Encounter (Signed)
Joelene Millin Leonard/PT called asking if we can order an orthotic for patient. She states she has significant pronation of the left foot affecting her knee/ankle/back. She wants [place] to fax to Reggie at [facility] Joelene Millin number is [phone] and her fax number is [phone]

## 2017-10-19 NOTE — Telephone Encounter (Signed)
That will be fine. 

## 2017-10-20 NOTE — Telephone Encounter (Signed)
Fax Rx to provided numbers

## 2017-11-11 NOTE — Telephone Encounter (Signed)
Joelene Millin called back asking for that order to be sent back over to Reggie, they states it wasn't received. [phone]

## 2017-11-11 NOTE — Telephone Encounter (Signed)
Faxed to number provided again

## 2017-11-22 ENCOUNTER — Ambulatory Visit (INDEPENDENT_AMBULATORY_CARE_PROVIDER_SITE_OTHER): Payer: Medicare Other | Admitting: Orthopaedic Surgery

## 2017-11-22 ENCOUNTER — Encounter (INDEPENDENT_AMBULATORY_CARE_PROVIDER_SITE_OTHER): Payer: Self-pay | Admitting: Orthopaedic Surgery

## 2017-11-22 DIAGNOSIS — M25572 Pain in left ankle and joints of left foot: Secondary | ICD-10-CM

## 2017-11-22 DIAGNOSIS — M7632 Iliotibial band syndrome, left leg: Secondary | ICD-10-CM

## 2017-11-22 NOTE — Progress Notes (Signed)
The patient is to continue follow-up with a left-sided trochanteric bursitis and IT band syndrome.  He is also had some intense to chronic pain in her left ankle.  She has had flatfoot deformity of the left foot in.  The therapist feel that she would benefit from a orthotic.  They feel that this would help with her IT band and trochanteric area as well.  I agree with this.  She says she still waiting to get the orthotic and has continued therapy visits into the next month.  On exam she has a lot of pain still of her left hip trochanteric area and IT band but excellent range of motion of her left hip with no pain in the groin at all.  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  She will continue physical therapy and I will see her back in 3 weeks to provide a trochanteric injection on the left side will help her for a 74-month trip that she is taking after that.  We then have her resume therapy in July if needed.

## 2017-12-15 ENCOUNTER — Ambulatory Visit (INDEPENDENT_AMBULATORY_CARE_PROVIDER_SITE_OTHER): Payer: Medicare Other | Admitting: Orthopaedic Surgery

## 2017-12-22 ENCOUNTER — Encounter (INDEPENDENT_AMBULATORY_CARE_PROVIDER_SITE_OTHER): Payer: Self-pay | Admitting: Orthopaedic Surgery

## 2017-12-22 ENCOUNTER — Ambulatory Visit (INDEPENDENT_AMBULATORY_CARE_PROVIDER_SITE_OTHER): Payer: Medicare Other | Admitting: Orthopaedic Surgery

## 2017-12-22 DIAGNOSIS — M7632 Iliotibial band syndrome, left leg: Secondary | ICD-10-CM

## 2017-12-22 DIAGNOSIS — M25562 Pain in left knee: Secondary | ICD-10-CM | POA: Diagnosis not present

## 2017-12-22 DIAGNOSIS — G8929 Other chronic pain: Secondary | ICD-10-CM | POA: Insufficient documentation

## 2017-12-22 DIAGNOSIS — M7062 Trochanteric bursitis, left hip: Secondary | ICD-10-CM

## 2017-12-22 MED ORDER — LIDOCAINE HCL 1 % IJ SOLN
3.0000 mL | INTRAMUSCULAR | Status: AC | PRN
  Administered 2017-12-22: 3 mL

## 2017-12-22 MED ORDER — METHYLPREDNISOLONE ACETATE 40 MG/ML IJ SUSP
40.0000 mg | INTRAMUSCULAR | Status: AC | PRN
  Administered 2017-12-22: 40 mg via INTRA_ARTICULAR

## 2017-12-22 NOTE — Progress Notes (Signed)
Office Visit Note   Patient: Alison Chandler           Date of Birth: May 27, 1941           MRN: [id number] Visit Date: 12/22/2017              Requested by: Verdell Carmine., MD [address] [place], [place] [address] PCP: Verdell Carmine., MD   Assessment & Plan: Visit Diagnoses:  1. It band syndrome, left   2. Trochanteric bursitis, left hip   3. Chronic pain of left knee     Plan: Per her wishes to provide a steroid injection in her left knee joint.  She understands over the risk and benefits of the injections.  I do recommend she at least try over-the-counter inserts with arch support given her flatfoot deformity on the left side.  We will see her back in about 4 months to see how she is doing overall.  Follow-Up Instructions: Return in about 4 months (around 04/23/2018).   Orders:  Orders Placed This Encounter  Procedures  . Large Joint Inj  . Large Joint Inj   No orders of the defined types were placed in this encounter.     Procedures: Large Joint Inj: L knee on 12/22/2017 1:10 PM Indications: diagnostic evaluation and pain Details: 22 G 1.5 in needle, superolateral approach  Arthrogram: No  Medications: 3 mL lidocaine 1 %; 40 mg methylPREDNISolone acetate 40 MG/ML Outcome: tolerated well, no immediate complications Procedure, treatment alternatives, risks and benefits explained, specific risks discussed. Consent was given by the patient. Immediately prior to procedure a time out was called to verify the correct patient, procedure, equipment, support staff and site/side marked as required. Patient was prepped and draped in the usual sterile fashion.       Clinical Data: No additional findings.   Subjective: Chief Complaint  Patient presents with  . Left Hip - Follow-up  The patient is coming in today to have an injection in her left knee.  She has pain in the left knee has been some what chronic but also IT band pain as well.  She also has a flatfoot  deformity.  She never got the orthotic to physical therapy because they felt like they want to see how she did rehabilitate her knee and hip prior to putting this implant/orthotic in because she can recall a long trip.  She would like to have a steroid injection in her left knee to help her on that trip.  Otherwise she is doing well with no acute changes in her medical status.  HPI  Review of Systems She denies any fever, chills, nausea, vomiting  Objective: Vital Signs: There were no vitals taken for this visit.  Physical Exam She is alert and oriented x3 and in no acute distress Ortho Exam Examination of her left knee shows mainly medial joint line tenderness.  There is a little bit of tenderness of the pes bursa and a little bit over the IT band.  Range of motion is full and the knee feels like this is stable. Specialty Comments:  No specialty comments available.  Imaging: No results found.   PMFS History: Patient Active Problem List   Diagnosis Date Noted  . Chronic pain of left knee 12/22/2017  . It band syndrome, left 08/11/2017  . Degenerative arthritis of hip 02/19/2012   Past Medical History:  Diagnosis Date  . Arthritis   . GERD (gastroesophageal reflux disease)   . Kidney  stones     History reviewed. No pertinent family history.  Past Surgical History:  Procedure Laterality Date  . colonscopy  01-27-2012  . TOTAL HIP ARTHROPLASTY  02/19/2012   Procedure: TOTAL HIP ARTHROPLASTY ANTERIOR APPROACH;  Surgeon: Mcarthur Rossetti, MD;  Location: [facility];  Service: Orthopedics;  Laterality: Right;   Social History   Occupational History  . Not on file  Tobacco Use  . Smoking status: Former Smoker    Packs/day: 0.25    Years: 20.00    Pack years: 5.00    Last attempt to quit: 09/01/1971    Years since quitting: 46.3  . Smokeless tobacco: Never Used  Substance and Sexual Activity  . Alcohol use: Yes    Alcohol/week: 0.6 oz    Types: 1 Cans of beer per week  .  Drug use: No  . Sexual activity: Not on file

## 2018-03-16 ENCOUNTER — Ambulatory Visit (INDEPENDENT_AMBULATORY_CARE_PROVIDER_SITE_OTHER): Payer: Medicare Other | Admitting: Orthopaedic Surgery

## 2018-03-16 ENCOUNTER — Ambulatory Visit (INDEPENDENT_AMBULATORY_CARE_PROVIDER_SITE_OTHER): Payer: Medicare Other

## 2018-03-16 ENCOUNTER — Encounter (INDEPENDENT_AMBULATORY_CARE_PROVIDER_SITE_OTHER): Payer: Self-pay | Admitting: Orthopaedic Surgery

## 2018-03-16 DIAGNOSIS — M25562 Pain in left knee: Secondary | ICD-10-CM | POA: Diagnosis not present

## 2018-03-16 MED ORDER — LIDOCAINE HCL 1 % IJ SOLN
3.0000 mL | INTRAMUSCULAR | Status: AC | PRN
  Administered 2018-03-16: 3 mL

## 2018-03-16 MED ORDER — METHYLPREDNISOLONE ACETATE 40 MG/ML IJ SUSP
40.0000 mg | INTRAMUSCULAR | Status: AC | PRN
  Administered 2018-03-16: 40 mg via INTRA_ARTICULAR

## 2018-03-16 NOTE — Progress Notes (Signed)
Office Visit Note   Patient: Alison Chandler           Date of Birth: 03-05-1941           MRN: [id number] Visit Date: 03/16/2018              Requested by: Verdell Carmine., MD [address] [place], [place] [address] PCP: Verdell Carmine., MD   Assessment & Plan: Visit Diagnoses:  1. Acute pain of left knee     Plan: Fortunately nothing has been fractured with her left knee.  She does understand that this can accelerate the pain from arthritis.  I did provide a steroid injection in the knee today and will try a hinged knee brace.  She will increase her activities as comfort allows.  All questions were answered and addressed.  She will keep her regular follow-up with me in September.  Follow-Up Instructions: Return in about 6 weeks (around 04/27/2018).   Orders:  Orders Placed This Encounter  Procedures  . XR KNEE 3 VIEW LEFT   No orders of the defined types were placed in this encounter.     Procedures: Large Joint Inj: L knee on 03/16/2018 1:51 PM Indications: diagnostic evaluation and pain Details: 22 G 1.5 in needle, superolateral approach  Arthrogram: No  Medications: 3 mL lidocaine 1 %; 40 mg methylPREDNISolone acetate 40 MG/ML Outcome: tolerated well, no immediate complications Procedure, treatment alternatives, risks and benefits explained, specific risks discussed. Consent was given by the patient. Immediately prior to procedure a time out was called to verify the correct patient, procedure, equipment, support staff and site/side marked as required. Patient was prepped and draped in the usual sterile fashion.       Clinical Data: No additional findings.   Subjective: Chief Complaint  Patient presents with  . Left Knee - Pain    [address], landing on knee 03/15/18   The patient comes in today with acute left knee pain after mechanical fall yesterday landing directly on her left kneecap.  She is a patient well-known to me.  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  She is been having trouble bending her knee backwards since this happened last evening.  She is been having knee swelling as well.  This was an accidental fall.  She denies any other injuries as it relates to this fall. HPI  Review of Systems She currently denies any headache, chest pain, shortness of breath, fever, chills, nausea, vomiting.  Objective: Vital Signs: There were no vitals taken for this visit.  Physical Exam She is alert and oriented x3 and in no acute distress examination of her left knee shows that her extensor mechanism is intact. Ortho Exam Her pain is on flexion but she can hold her knee in full extension.  She has medial lateral joint line tenderness. Specialty Comments:  No specialty comments available.  Imaging: Xr Knee 3 View Left  Result Date: 03/16/2018 3 views of the left knee show no acute findings in terms of fracture given the patient's recent fall.  There is severe significant tricompartmental arthritis with particular osteophytes and joint space narrowing.    PMFS History: Patient Active Problem List   Diagnosis Date Noted  . Chronic pain of left knee 12/22/2017  . It band syndrome, left 08/11/2017  . Degenerative arthritis of hip 02/19/2012   Past Medical History:  Diagnosis Date  . Arthritis   . GERD (gastroesophageal reflux disease)   . Kidney stones  No family history on file.  Past Surgical History:  Procedure Laterality Date  . colonscopy  01-27-2012  . TOTAL HIP ARTHROPLASTY  02/19/2012   Procedure: TOTAL HIP ARTHROPLASTY ANTERIOR APPROACH;  Surgeon: Mcarthur Rossetti, MD;  Location: [facility];  Service: Orthopedics;  Laterality: Right;   Social History   Occupational History  . Not on file  Tobacco Use  . Smoking status: Former Smoker    Packs/day: 0.25    Years: 20.00    Pack years: 5.00    Last attempt to quit: 09/01/1971    Years since quitting: 46.5  . Smokeless tobacco: Never Used  Substance and Sexual  Activity  . Alcohol use: Yes    Alcohol/week: 0.6 oz    Types: 1 Cans of beer per week  . Drug use: No  . Sexual activity: Not on file

## 2018-04-25 ENCOUNTER — Ambulatory Visit (INDEPENDENT_AMBULATORY_CARE_PROVIDER_SITE_OTHER): Payer: Medicare Other | Admitting: Orthopaedic Surgery

## 2018-05-04 ENCOUNTER — Ambulatory Visit (INDEPENDENT_AMBULATORY_CARE_PROVIDER_SITE_OTHER): Payer: Medicare Other | Admitting: Orthopaedic Surgery

## 2018-05-04 ENCOUNTER — Encounter (INDEPENDENT_AMBULATORY_CARE_PROVIDER_SITE_OTHER): Payer: Self-pay | Admitting: Orthopaedic Surgery

## 2018-05-04 DIAGNOSIS — G8929 Other chronic pain: Secondary | ICD-10-CM

## 2018-05-04 DIAGNOSIS — M25562 Pain in left knee: Secondary | ICD-10-CM | POA: Diagnosis not present

## 2018-05-04 DIAGNOSIS — M7632 Iliotibial band syndrome, left leg: Secondary | ICD-10-CM

## 2018-05-04 DIAGNOSIS — M25511 Pain in right shoulder: Secondary | ICD-10-CM | POA: Diagnosis not present

## 2018-05-04 MED ORDER — NAPROXEN 500 MG PO TABS: 500.0000 mg | ORAL_TABLET | Freq: Two times a day (BID) | ORAL | 3 refills | Status: DC | PRN

## 2018-05-04 NOTE — Progress Notes (Signed)
The patient is well-known to me.  She does have osteophytes her left knee but it only bothers recently after mechanical fall landing on that knee.  She feels like she is getting better but the knee is been stiff.  An injection last month did help some.  She is very active 77 year old female.  Her right shoulder is been hurting recently since that fall as well.  On exam the right shoulder seem to be moving well but is painful to her at that does not show any significant evidence of the rotator cuff.  Her left knee is moving better overall as well  She is someone that would definitely benefit from outpatient physical therapy.  She is concerned about her balance coordination so I do feel that strengthening her bilateral lower extremities especially concentrating on any modalities to help her left knee would be great.  They can also work on her right shoulder as well with improving her right shoulder function decreasing pain.  She is agreeable to this.  I will refill her naproxen would like to see her back in about 6 weeks to see how therapy is done for her.  All questions concerns were answered and addressed.

## 2018-06-16 ENCOUNTER — Ambulatory Visit (INDEPENDENT_AMBULATORY_CARE_PROVIDER_SITE_OTHER): Payer: Medicare Other | Admitting: Orthopaedic Surgery

## 2018-06-20 DIAGNOSIS — M76822 Posterior tibial tendinitis, left leg: Secondary | ICD-10-CM | POA: Insufficient documentation

## 2018-06-20 DIAGNOSIS — M2140 Flat foot [pes planus] (acquired), unspecified foot: Secondary | ICD-10-CM | POA: Insufficient documentation

## 2018-07-14 ENCOUNTER — Ambulatory Visit (INDEPENDENT_AMBULATORY_CARE_PROVIDER_SITE_OTHER): Payer: Medicare Other | Admitting: Orthopaedic Surgery

## 2018-08-03 ENCOUNTER — Encounter (INDEPENDENT_AMBULATORY_CARE_PROVIDER_SITE_OTHER): Payer: Self-pay | Admitting: Orthopaedic Surgery

## 2018-08-03 ENCOUNTER — Ambulatory Visit (INDEPENDENT_AMBULATORY_CARE_PROVIDER_SITE_OTHER): Payer: Medicare Other | Admitting: Orthopaedic Surgery

## 2018-08-03 DIAGNOSIS — G8929 Other chronic pain: Secondary | ICD-10-CM

## 2018-08-03 DIAGNOSIS — M25562 Pain in left knee: Secondary | ICD-10-CM | POA: Diagnosis not present

## 2018-08-03 DIAGNOSIS — M7632 Iliotibial band syndrome, left leg: Secondary | ICD-10-CM | POA: Diagnosis not present

## 2018-08-03 DIAGNOSIS — M25561 Pain in right knee: Secondary | ICD-10-CM

## 2018-08-03 MED ORDER — LIDOCAINE HCL 1 % IJ SOLN
3.0000 mL | INTRAMUSCULAR | Status: AC | PRN
  Administered 2018-08-03: 3 mL

## 2018-08-03 MED ORDER — METHYLPREDNISOLONE ACETATE 40 MG/ML IJ SUSP
40.0000 mg | INTRAMUSCULAR | Status: AC | PRN
  Administered 2018-08-03: 40 mg via INTRA_ARTICULAR

## 2018-08-03 NOTE — Progress Notes (Signed)
Office Visit Note   Patient: Alison Chandler           Date of Birth: 1940-10-11           MRN: [id number] Visit Date: 08/03/2018              Requested by: Verdell Carmine., MD [address] [place], [place] [address] PCP: Verdell Carmine., MD   Assessment & Plan: Visit Diagnoses:  1. It band syndrome, left   2. Chronic pain of left knee   3. Acute pain of right knee     Plan: We did have x-rays of her knees obtained in July of this year.  This is mainly the left knee but the right knee was obtained on a sunrise view and a standing AP view.  There is patellofemoral arthritic changes but still well-maintained medial lateral joint space.  I did provide a steroid injection in the right knee joint today as well as a small steroid injection around the left fibular head area where she is most tender.  She tolerated both of these well.  All question concerns were answered and addressed.  She will continue to work on her quad strengthening and mobility.  Follow-up will be as needed.  Follow-Up Instructions: Return if symptoms worsen or fail to improve.   Orders:  Orders Placed This Encounter  Procedures  . Large Joint Inj   No orders of the defined types were placed in this encounter.     Procedures: Large Joint Inj: R knee on 08/03/2018 11:09 AM Indications: diagnostic evaluation and pain Details: 22 G 1.5 in needle, superolateral approach  Arthrogram: No  Medications: 3 mL lidocaine 1 %; 40 mg methylPREDNISolone acetate 40 MG/ML Outcome: tolerated well, no immediate complications Procedure, treatment alternatives, risks and benefits explained, specific risks discussed. Consent was given by the patient. Immediately prior to procedure a time out was called to verify the correct patient, procedure, equipment, support staff and site/side marked as required. Patient was prepped and draped in the usual sterile fashion.       Clinical Data: No additional  findings.   Subjective: No chief complaint on file. The patient comes in for continued evaluation treatment of left knee pain.  She has had an acute onset of right knee pain and she points to the medial joint line of the right knee is source of her pain.  The left knee has no lateral compartment arthritic changes but more of her pain is been around the fibular head.  She is very active 77 year old female.  We have had her in physical therapy because she just wants to be able to feel like she is much less of a fall risk and she is really worked on Forensic scientist exercises.  She is wants to be able to get out of the chair easier as well.  She feels like she is accomplishing things better.  She has a few more therapy visits and is transitioning to a home exercise program.  HPI  Review of Systems She currently denies any headache, chest pain, shortness of breath, fever, chills, nausea, vomiting.  Objective: Vital Signs: There were no vitals taken for this visit.  Physical Exam She is alert and orient x3 and in no acute distress Ortho Exam Examination of her left knee shows no medial lateral joint line tenderness.  There is patellofemoral crepitation.  A lot of her pain is over the fibular head area.  She has a negative Tinel sign  over this area.  She has good strength in her foot and ankle with normal sensation.  Examination of her right knee does show significant tenderness along the medial joint line.  There is patellofemoral crepitation.  The knee is ligamentously stable. Specialty Comments:  No specialty comments available.  Imaging: No results found.   PMFS History: Patient Active Problem List   Diagnosis Date Noted  . Acute pain of right shoulder 05/04/2018  . Chronic pain of left knee 12/22/2017  . It band syndrome, left 08/11/2017  . Degenerative arthritis of hip 02/19/2012   Past Medical History:  Diagnosis Date  . Arthritis   . GERD (gastroesophageal reflux disease)    . Kidney stones     History reviewed. No pertinent family history.  Past Surgical History:  Procedure Laterality Date  . colonscopy  01-27-2012  . TOTAL HIP ARTHROPLASTY  02/19/2012   Procedure: TOTAL HIP ARTHROPLASTY ANTERIOR APPROACH;  Surgeon: Mcarthur Rossetti, MD;  Location: [facility];  Service: Orthopedics;  Laterality: Right;   Social History   Occupational History  . Not on file  Tobacco Use  . Smoking status: Former Smoker    Packs/day: 0.25    Years: 20.00    Pack years: 5.00    Last attempt to quit: 09/01/1971    Years since quitting: 46.9  . Smokeless tobacco: Never Used  Substance and Sexual Activity  . Alcohol use: Yes    Alcohol/week: 1.0 standard drinks    Types: 1 Cans of beer per week  . Drug use: No  . Sexual activity: Not on file

## 2018-10-05 DIAGNOSIS — H2513 Age-related nuclear cataract, bilateral: Secondary | ICD-10-CM | POA: Insufficient documentation

## 2018-10-05 DIAGNOSIS — H3589 Other specified retinal disorders: Secondary | ICD-10-CM | POA: Insufficient documentation

## 2019-03-27 ENCOUNTER — Ambulatory Visit (INDEPENDENT_AMBULATORY_CARE_PROVIDER_SITE_OTHER): Payer: Medicare Other

## 2019-03-27 ENCOUNTER — Encounter: Payer: Self-pay | Admitting: General Surgery

## 2019-03-27 ENCOUNTER — Other Ambulatory Visit: Payer: Self-pay

## 2019-03-27 ENCOUNTER — Ambulatory Visit (INDEPENDENT_AMBULATORY_CARE_PROVIDER_SITE_OTHER): Payer: Medicare Other | Admitting: General Surgery

## 2019-03-27 VITALS — BP 140/78 | HR 74 | Temp 97.1°F | Ht 69.0 in | Wt 223.2 lb

## 2019-03-27 DIAGNOSIS — E210 Primary hyperparathyroidism: Secondary | ICD-10-CM

## 2019-03-27 DIAGNOSIS — E209 Hypoparathyroidism, unspecified: Secondary | ICD-10-CM

## 2019-03-27 NOTE — Patient Instructions (Signed)
You are scheduled for a Nuclear Medicine Parathyroid scan at [facility] on 04/10/19 at 11:00 am. You will arrive by 10:30 am. You enter in through the main entrance and check in at Radiology.  Please call us when you are able to schedule your surgery.

## 2019-03-27 NOTE — Progress Notes (Signed)
Patient ID: Alison Chandler, female   DOB: 1940-09-09, 78 y.o.   MRN: [id number]  Chief Complaint  Patient presents with  . New Patient (Initial Visit)    parathyroidism    HPI Alison Chandler is a 78 y.o. female.   She has been referred by her primary care doctor, Dr. Ward Givens, for surgical evaluation of primary hyperparathyroidism.  Alison Chandler states that she saw Dr. Harrell Lark for routine blood work.  Her calcium was found to be slightly elevated.  On recheck, her level was down somewhat, but to further investigate, Dr. Harrell Lark ordered an intact PTH level.  This was found to be elevated.  Her vitamin D level is normal.  She has not taken any thiazide diuretics nor any bisphosphonates.  Alison Chandler states that on retrospect, she realized that her calcium has been high several years ago, prior to beginning to follow with Dr. Toula Chandler.  She had been taking calcium supplements at the time.  Her old primary care provider had her decrease the amount, but did not further investigate.  Dr. Harrell Lark appropriately discontinued calcium supplementation upon discovery of hypercalcemia with associated high intact PTH.  Alison Chandler does have a history of gastroesophageal reflux disease, but has never had peptic ulcer disease.  She endorses polyuria without polydipsia.  She has never had pancreatitis.  She does have a history of nephrolithiasis.  She endorses fatigue.  She has never had a pathologic fracture, although a recent bone density study was consistent with osteopenia.  She feels like her memory is not as good as it used to be.  She endorses muscle aches, specifically in her thighs.  She has nocturia x2 but otherwise endorses no sleep disturbances.  No pruritus.  She has never had any head or neck irradiation.  She does not know of any family history of elevated calcium levels, thyroid cancer, pituitary, pancreas, or adrenal tumors.  Her mother did take thyroid hormone replacement, but no other endocrinopathies are known.  She was recently  diagnosed with hypertension and has been initiated on atenolol.   Past Medical History:  Diagnosis Date  . Arthritis   . GERD (gastroesophageal reflux disease)   . Hearing loss   . Hyperlipidemia   . Hypertension   . Kidney stones     Past Surgical History:  Procedure Laterality Date  . colonscopy  01-27-2012  . 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 Thyroid disease Mother   . Lung cancer Mother 27  . Breast cancer Mother 80  . Parkinson's disease Father     Social History Social History   Tobacco Use  . Smoking status: Former Smoker    Packs/day: 0.25    Years: 20.00    Pack years: 5.00    Quit date: 09/01/1971    Years since quitting: 47.6  . Smokeless tobacco: Never Used  Substance Use Topics  . Alcohol use: Yes    Alcohol/week: 1.0 standard drinks    Types: 1 Cans of beer per week  . Drug use: No    No Known Allergies  Current Outpatient Medications  Medication Sig Dispense Refill  . acetaminophen (TYLENOL) 500 MG tablet Take 500 mg by mouth every 6 (six) hours as needed.    Marland Kitchen atenolol (TENORMIN) 50 MG tablet Take 50 mg by  mouth daily.    Marland Kitchen atorvastatin (LIPITOR) 20 MG tablet Take 20 mg by mouth at bedtime.    . cholecalciferol (VITAMIN D) 1000 UNITS tablet Take 5,000 Units by mouth daily with breakfast.    . Cyanocobalamin (VITAMIN B12 PO) Take by mouth.    . fish oil-omega-3 fatty acids 1000 MG capsule Take 1 g by mouth daily with breakfast.    . glucosamine-chondroitin 500-400 MG tablet Take 1 tablet by mouth daily with breakfast.    . naproxen sodium (ALEVE) 220 MG tablet Take 220 mg by mouth as needed.     No current facility-administered medications for this visit.     Review of Systems Review of Systems  Constitutional: Positive for fatigue and  unexpected weight change.  HENT: Positive for hearing loss and sinus pressure.   Gastrointestinal: Negative for constipation.  Endocrine: Positive for polyuria. Negative for polydipsia.  Genitourinary: Positive for frequency.  Musculoskeletal: Positive for arthralgias, myalgias, neck pain and neck stiffness.  Neurological: Positive for tremors.  All other systems reviewed and are negative.   Blood pressure 140/78, pulse 74, temperature (!) 97.1 F (36.2 C), height 5\' 9"  (1.753 m), weight 223 lb 3.2 oz (101.2 kg), SpO2 96 %.  Physical Exam Physical Exam Vitals signs reviewed.  Constitutional:      General: She is not in acute distress.    Appearance: Normal appearance. She is obese.  HENT:     Head: Normocephalic and atraumatic.     Nose:     Comments: Covered with a mask secondary to COVID-19 precautions    Mouth/Throat:     Comments: Covered with a mask secondary to COVID-19 precautions Eyes:     General: No scleral icterus.       Right eye: No discharge.        Left eye: No discharge.     Conjunctiva/sclera: Conjunctivae normal.  Neck:     Musculoskeletal: Normal range of motion.     Comments: No discrete masses palpated.  She has kyphosis. Cardiovascular:     Rate and Rhythm: Normal rate and regular rhythm.     Pulses: Normal pulses.  Pulmonary:     Effort: Pulmonary effort is normal.     Breath sounds: Normal breath sounds.  Abdominal:     General: Bowel sounds are normal.     Palpations: Abdomen is soft.  Genitourinary:    Comments: Deferred Musculoskeletal: Normal range of motion.  Lymphadenopathy:     Cervical: No cervical adenopathy.  Skin:    General: Skin is warm and dry.  Neurological:     General: No focal deficit present.     Mental Status: She is alert and oriented to person, place, and time.  Psychiatric:        Mood and Affect: Mood normal.        Behavior: Behavior normal.        Thought Content: Thought content normal.        Judgment:  Judgment normal.     Data Reviewed I reviewed the outside hospital laboratory studies via the care everywhere link.  These show a normal vitamin D level as of March 02, 2019 her intact PTH has been elevated.  On November 08, 2018, it was 31 with a calcium of 10.4 and on March 02, 2019, it was 128 with a corresponding calcium of 10.6.  Her calcium has been at least as high as 11.4 in the recent past.  A bone density study from September 14, 2018 shows osteopenia, with a T score of -1.8 in her left femoral neck and a T score of -1.4 in the distal radius of her nondominant forearm.  Her lumbar spine shows osteopenia with a T score of -1.0.  Assessment This is a 78 year old woman who has biochemical evidence of primary hyperparathyroidism.  She has bone mineral density loss, nephrolithiasis, and other secondary symptoms.  She meets criteria for surgical intervention.  Plan I performed a bedside ultrasound in clinic today.  There are number of small cystic and hypoechoic nodules within the thyroid parenchyma.  None of them appear consistent with an intrathyroidal parathyroid adenoma.  I did not appreciate a discrete adenoma on the imaging study today.  Please note that this study was somewhat limited secondary to patient's inability to fully extend her neck.  We will obtain a nuclear medicine parathyroid scan for hopeful localization of the abnormal parathyroid tissue.  If it is not seen on this study, we will obtain a 4-dimensional CT scan.  I discussed the details of surgery with her and her husband.  I explained that typically, it is a single gland or less frequently, 2 glands that are abnormal.  Occasionally, patients will have multi-gland hyperplasia.  During the operation, I will monitor intact PTH levels to ensure that all hyper secreting tissue has been removed.  We discussed the risks of the procedure. The risks of parathyroid surgery were discussed with the patient, including (but not limited to):  bleeding, infection, damage to surrounding structures/tissues, injury (temporary or permanent) to the recurrent laryngeal nerve, hypocalcemia (temporary or permanent), need to take calcium and/or vitamin D supplementation, recurrent hyperparathyroidism, failure to correct hyperparathyroidism, need for additional surgery.  The patient had the opportunity to ask any questions and these were answered to their satisfaction.  We will schedule both the imaging studies and her surgery.    Fredirick Maudlin 03/27/2019, 11:55 AM

## 2019-03-27 NOTE — Addendum Note (Signed)
Addended by: Fredirick Maudlin on: 03/27/2019 02:14 PM   Modules accepted: Orders, SmartSet

## 2019-04-10 ENCOUNTER — Other Ambulatory Visit: Payer: Self-pay

## 2019-04-10 ENCOUNTER — Encounter (HOSPITAL_COMMUNITY)
Admission: RE | Admit: 2019-04-10 | Discharge: 2019-04-10 | Disposition: A | Discharge status: Home / Self Care | Payer: Medicare Other | Source: Ambulatory Visit | Attending: General Surgery | Admitting: General Surgery

## 2019-04-10 DIAGNOSIS — E209 Hypoparathyroidism, unspecified: Secondary | ICD-10-CM | POA: Diagnosis present

## 2019-04-10 MED ORDER — TECHNETIUM TC 99M SESTAMIBI - CARDIOLITE
26.2000 | Freq: Once | INTRAVENOUS | Status: AC | PRN
  Administered 2019-04-10: 26.2 via INTRAVENOUS

## 2019-04-13 ENCOUNTER — Telehealth: Payer: Self-pay | Admitting: General Surgery

## 2019-04-13 NOTE — Telephone Encounter (Signed)
I discussed the results of her recent nuclear medicine parathyroid scan with her.  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  She would like to schedule surgery, however her husband is going to require a cardiac ablation procedure in the coming week or so.  She is looking at September for possible OR date for herself.  I will have our surgery schedulers contact her and make the appropriate arrangements.

## 2019-04-17 ENCOUNTER — Telehealth: Payer: Self-pay | Admitting: *Deleted

## 2019-04-17 NOTE — Telephone Encounter (Signed)
Called the patient today at the request of Dr. Celine Ahr to schedule surgery.   Patient's parathyroidectomy to be scheduled for 05-10-19 at [facility]. We will see if Dr. Genevive Bi can assist. Patient requested surgery be in to September due to having to take care of her husband.   The patient lives in [place] so she would like Pre-admit appointment and COVID test the same day. She is aware she will need to check in thru the [place] entrance.   Patient will be contacted for further instructions once [facility] posts.

## 2019-04-17 NOTE — Telephone Encounter (Signed)
Patient's surgery has been scheduled for 05-10-19 at [facility] with Dr. Celine Ahr. Dr. Genevive Bi did confirm that he can assist.   The patient will go to Pre-admit on 05-05-19 at 9 am. She will have COVID testing done after at the [facility].  Paperwork to be mailed to the patient today.   She is aware to call should she have further questions.

## 2019-04-28 ENCOUNTER — Emergency Department (HOSPITAL_BASED_OUTPATIENT_CLINIC_OR_DEPARTMENT_OTHER)
Admission: EM | Admit: 2019-04-28 | Discharge: 2019-04-28 | Disposition: A | Discharge status: Home / Self Care | Payer: Medicare Other | Attending: Emergency Medicine | Admitting: Emergency Medicine

## 2019-04-28 ENCOUNTER — Other Ambulatory Visit: Payer: Self-pay

## 2019-04-28 ENCOUNTER — Emergency Department (HOSPITAL_BASED_OUTPATIENT_CLINIC_OR_DEPARTMENT_OTHER): Payer: Medicare Other

## 2019-04-28 ENCOUNTER — Encounter (HOSPITAL_BASED_OUTPATIENT_CLINIC_OR_DEPARTMENT_OTHER): Payer: Self-pay | Admitting: Emergency Medicine

## 2019-04-28 DIAGNOSIS — S20219A Contusion of unspecified front wall of thorax, initial encounter: Secondary | ICD-10-CM | POA: Diagnosis not present

## 2019-04-28 DIAGNOSIS — Y929 Unspecified place or not applicable: Secondary | ICD-10-CM | POA: Diagnosis not present

## 2019-04-28 DIAGNOSIS — Z79899 Other long term (current) drug therapy: Secondary | ICD-10-CM | POA: Diagnosis not present

## 2019-04-28 DIAGNOSIS — Y939 Activity, unspecified: Secondary | ICD-10-CM | POA: Diagnosis not present

## 2019-04-28 DIAGNOSIS — I10 Essential (primary) hypertension: Secondary | ICD-10-CM | POA: Diagnosis not present

## 2019-04-28 DIAGNOSIS — Z87891 Personal history of nicotine dependence: Secondary | ICD-10-CM | POA: Insufficient documentation

## 2019-04-28 DIAGNOSIS — Y999 Unspecified external cause status: Secondary | ICD-10-CM | POA: Insufficient documentation

## 2019-04-28 DIAGNOSIS — W0110XA Fall on same level from slipping, tripping and stumbling with subsequent striking against unspecified object, initial encounter: Secondary | ICD-10-CM | POA: Diagnosis not present

## 2019-04-28 DIAGNOSIS — W19XXXA Unspecified fall, initial encounter: Secondary | ICD-10-CM

## 2019-04-28 DIAGNOSIS — S299XXA Unspecified injury of thorax, initial encounter: Secondary | ICD-10-CM | POA: Diagnosis present

## 2019-04-28 MED ORDER — NAPROXEN 250 MG PO TABS
500.0000 mg | ORAL_TABLET | Freq: Once | ORAL | Status: AC
  Administered 2019-04-28: 500 mg via ORAL
  Filled 2019-04-28: qty 2

## 2019-04-28 MED ORDER — LIDOCAINE 5 % EX PTCH: 1.0000 | MEDICATED_PATCH | CUTANEOUS | 0 refills | Status: DC

## 2019-04-28 MED ORDER — ACETAMINOPHEN 500 MG PO TABS
1000.0000 mg | ORAL_TABLET | Freq: Once | ORAL | Status: AC
  Administered 2019-04-28: 1000 mg via ORAL
  Filled 2019-04-28: qty 2

## 2019-04-28 NOTE — ED Triage Notes (Signed)
Pt states she was carrying a Stage manager and tripped on a throw rug causing her to stumble forward and fall  States the laundry basket caught her under her left breast  Pt is c/o pain under and around her left breast  No bruising noted  Pt is tender to palpation under the left breast and she says it hurts to take a deep breath

## 2019-04-28 NOTE — ED Provider Notes (Signed)
[facility] EMERGENCY DEPARTMENT Provider Note   CSN: [id number] Arrival date & time: 04/28/19  0057     History   Chief Complaint Chief Complaint  Patient presents with  . Fall    HPI Alison Chandler is a 78 y.o. female.     The history is provided by the patient.  Fall This is a new problem. The current episode started less than 1 hour ago. The problem occurs constantly. The problem has not changed since onset.Pertinent negatives include no chest pain, no abdominal pain, no headaches and no shortness of breath. Nothing aggravates the symptoms. Nothing relieves the symptoms. She has tried nothing for the symptoms. The treatment provided no relief.  Tripped holding a laundry basket and hit ribs.  Did not hit head, no LOC.    Past Medical History:  Diagnosis Date  . Arthritis   . GERD (gastroesophageal reflux disease)   . Hearing loss   . Hyperlipidemia   . Hypertension   . Kidney stones     Patient Active Problem List   Diagnosis Date Noted  . Acute pain of right shoulder 05/04/2018  . Chronic pain of left knee 12/22/2017  . It band syndrome, left 08/11/2017  . 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 colonscopy  01-27-2012  . KIDNEY STONE SURGERY    . 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 Authorizing Provider  acetaminophen (TYLENOL) 500 MG tablet Take 500 mg by mouth every 6 (six) hours as needed.     [provider]  atenolol (TENORMIN) 50 MG tablet Take 50 mg by mouth daily.    [provider]  atorvastatin (LIPITOR) 20 MG tablet Take 20 mg by mouth at bedtime.    [provider]  cholecalciferol (VITAMIN D) 1000 UNITS tablet Take 5,000 Units by mouth daily with breakfast.    [provider]  Cyanocobalamin (VITAMIN B12 PO) Take by mouth.    [provider]  fish oil-omega-3 fatty acids 1000 MG capsule Take 1 g by mouth daily with breakfast.    [provider]  glucosamine-chondroitin 500-400 MG tablet Take 1 tablet by mouth daily with breakfast.    [provider]  lidocaine (LIDODERM) 5 % Place 1 patch onto the skin daily. Remove & Discard patch within 12 hours or as directed by MD 04/28/19   Randal Buba, Felicitas Sine, MD  naproxen sodium (ALEVE) 220 MG tablet Take 220 mg by mouth as needed.    [provider]    Family History Family History  Problem Relation Age of Onset  . Thyroid disease Mother   . Lung cancer Mother 41  .  Breast cancer Mother 31  . Parkinson's disease Father     Social History Social History   Tobacco Use  . Smoking status: Former Smoker    Packs/day: 0.25    Years: 20.00    Pack years: 5.00    Quit date: 09/01/1971    Years since quitting: 47.6  . Smokeless tobacco: Never Used  Substance Use Topics  . Alcohol use: Yes    Alcohol/week: 1.0 standard drinks    Types: 1 Cans of beer per week  . Drug use: No     Allergies   Patient has no known allergies.   Review of Systems Review of Systems  Constitutional: Negative for fever.  HENT: Negative for congestion.   Eyes: Negative for visual disturbance.  Respiratory: Negative for chest tightness and shortness of breath.   Cardiovascular: Negative for chest pain, palpitations and leg swelling.  Gastrointestinal: Negative for abdominal pain.  Endocrine: Negative for polyuria.  Genitourinary: Negative for difficulty urinating.   Musculoskeletal: Negative for arthralgias.  Neurological: Negative for headaches.  Hematological: Negative for adenopathy.  Psychiatric/Behavioral: Negative for agitation.  All other systems reviewed and are negative.    Physical Exam Updated Vital Signs BP (!) 184/90 (BP Location: Right Arm)   Pulse 63   Temp 98.1 F (36.7 C) (Oral)   Resp 18   Ht 5\' 9"  (1.753 m)   Wt 99.8 kg   SpO2 100%   BMI 32.49 kg/m   Physical Exam Vitals signs and nursing note reviewed.  Constitutional:      General: She is not in acute distress.    Appearance: She is normal weight. She is not toxic-appearing.  HENT:     Head: Normocephalic and atraumatic.     Nose: Nose normal.  Eyes:     Conjunctiva/sclera: Conjunctivae normal.     Pupils: Pupils are equal, round, and reactive to light.  Neck:     Musculoskeletal: Normal range of motion and neck supple.  Cardiovascular:     Rate and Rhythm: Normal rate and regular rhythm.     Pulses: Normal pulses.     Heart sounds: Normal heart sounds.  Pulmonary:     Effort: Pulmonary effort is normal. No respiratory distress.     Breath sounds: No stridor. No wheezing, rhonchi or rales.  Chest:     Chest wall: No tenderness.  Abdominal:     General: Abdomen is flat. Bowel sounds are normal.     Tenderness: There is no abdominal tenderness. There is no guarding.  Musculoskeletal: Normal range of motion.     Cervical back: Normal.     Thoracic back: Normal.     Lumbar back: Normal.  Skin:    General: Skin is warm and dry.     Capillary Refill: Capillary refill takes less than 2 seconds.  Neurological:     General: No focal deficit present.     Mental Status: She is alert and oriented to person, place, and time.  Psychiatric:        Mood and Affect: Mood normal.      ED Treatments / Results  Labs (all labs ordered are listed, but only abnormal results are displayed) Labs Reviewed - No data to display  EKG None  Radiology Dg Chest 2 View   Result Date: 04/28/2019 CLINICAL DATA:  Fall pain under left breast EXAM: CHEST - 2 VIEW COMPARISON:  None. FINDINGS: No focal opacity or pleural effusion. Borderline cardiomegaly. No pneumothorax. Degenerative changes of the spine.  IMPRESSION: No active cardiopulmonary disease.  Borderline cardiomegaly. Electronically Signed   By: Donavan Foil M.D.   On: 04/28/2019 01:32    Procedures Procedures (including critical care time)  Medications Ordered in ED Medications  naproxen (NAPROSYN) tablet 500 mg (500 mg Oral Given 04/28/19 0211)  acetaminophen (TYLENOL) tablet 1,000 mg (1,000 mg Oral Given 04/28/19 0211)     No fractured ribs no pneumothorax.  Chest contusion from fall.  ICE and NSAIDs will take her home norco.  Will add lidoderm.   Jolissa Doron was evaluated in Emergency Department on 04/28/2019 for the symptoms described in the history of present illness. She was evaluated in the context of the global COVID-19 pandemic, which necessitated consideration that the patient might be at risk for infection with the SARS-CoV-2 virus that causes COVID-19. Institutional protocols and algorithms that pertain to the evaluation of patients at risk for COVID-19 are in a state of rapid change based on information released by regulatory bodies including the CDC and federal and state organizations. These policies and algorithms were followed during the patient's care in the ED.   Final Clinical Impressions(s) / ED Diagnoses   Final diagnoses:  Fall, initial encounter   Return for intractable cough, coughing up blood,fevers >100.4 unrelieved by medication, shortness of breath, intractable vomiting, chest pain, shortness of breath, weakness,numbness, changes in speech, facial asymmetry,abdominal pain, passing out,Inability to tolerate liquids or food, cough, altered mental status or any concerns. No signs of systemic illness or infection. The patient is nontoxic-appearing on exam and vital signs  are within normal limits.   I have reviewed the triage vital signs and the nursing notes. Pertinent labs &imaging results that were available during my care of the patient were reviewed by me and considered in my medical decision making (see chart for details).  After history, exam, and medical workup I feel the patient has been appropriately medically screened and is safe for discharge home. Pertinent diagnoses were discussed with the patient. Patient was given return precautions  ED Discharge Orders         Ordered    lidocaine (LIDODERM) 5 %  Every 24 hours     04/28/19 0159           Raelene Trew, MD 04/28/19 [phone]

## 2019-05-05 ENCOUNTER — Other Ambulatory Visit
Admission: RE | Admit: 2019-05-05 | Discharge: 2019-05-05 | Disposition: A | Discharge status: Home / Self Care | Payer: Medicare Other | Source: Ambulatory Visit | Attending: General Surgery | Admitting: General Surgery

## 2019-05-05 ENCOUNTER — Other Ambulatory Visit: Payer: Self-pay

## 2019-05-05 ENCOUNTER — Encounter
Admission: RE | Admit: 2019-05-05 | Discharge: 2019-05-05 | Disposition: A | Discharge status: Home / Self Care | Payer: Medicare Other | Source: Ambulatory Visit | Attending: General Surgery | Admitting: General Surgery

## 2019-05-05 DIAGNOSIS — Z01812 Encounter for preprocedural laboratory examination: Secondary | ICD-10-CM | POA: Insufficient documentation

## 2019-05-05 DIAGNOSIS — Z20828 Contact with and (suspected) exposure to other viral communicable diseases: Secondary | ICD-10-CM | POA: Diagnosis not present

## 2019-05-05 HISTORY — DX: Personal history of urinary calculi: Z87.442

## 2019-05-05 LAB — SARS CORONAVIRUS 2 (TAT 6-24 HRS): SARS Coronavirus 2: NEGATIVE

## 2019-05-05 NOTE — OR Nursing (Signed)
EKG - PERFORMED DURING CLINIC VISIT3/11/2018 [facility] Result Narrative  Ventricular Rate          83    BPM          Atrial Rate            83    BPM          P-R Interval            134    ms          QRS Duration            98    ms          Q-T Interval            378    ms          QTC                444    ms          P Axis               39    degrees        R Axis               -37    degrees        T Axis               19    degrees         Sinus rhythm with frequent Premature ventricular complexes  Left axis deviation  Left ventricular enlargement  When compared with ECG of 03-Dec-2016 11:52,  Premature ventricular complexes are now present  Confirmed by HAISTY, DR. Viona Gilmore. K. ([id number]) on 11/02/2018 8:44:37 AM   Other Result Information  Interface, External Ris In - 11/02/2018  8:44 AM EST Ventricular Rate                   83        BPM                  Atrial Rate                        83        BPM                  P-R Interval                       134       ms                   QRS Duration                       98        ms                   Q-T Interval                       378       ms                   QTC  444       ms                   P Axis                             39        degrees              R Axis                             -37       degrees              T Axis                             19        degrees               Sinus rhythm with frequent Premature ventricular complexes  Left axis deviation  Left ventricular enlargement  When compared with ECG of 03-Dec-2016 11:52,  Premature ventricular complexes are now present  Confirmed by HAISTY, DR. Viona Gilmore. K. ([id number]) on 11/02/2018 8:44:37 AM

## 2019-05-05 NOTE — Patient Instructions (Signed)
Your procedure is scheduled on: Wednesday 05/10/19 Report to [facility]. To find out your arrival time please call [phone] between 1PM - 3PM on Tuesday 05/09/19.  Remember: Instructions that are not followed completely may result in serious medical risk, up to and including death, or upon the discretion of your surgeon and anesthesiologist your surgery may need to be rescheduled.     _X__ 1. Do not eat food after midnight the night before your procedure.                 No gum chewing or hard candies. You may drink clear liquids up to 2 hours                 before you are scheduled to arrive for your surgery- DO not drink clear                 liquids within 2 hours of the start of your surgery.                 Clear Liquids include:  water, apple juice without pulp, clear carbohydrate                 drink such as Clearfast or Gatorade, Black Coffee or Tea (Do not add                 anything to coffee or tea). Diabetics water only  __X__2.  On the morning of surgery brush your teeth with toothpaste and water, you                 may rinse your mouth with mouthwash if you wish.  Do not swallow any              toothpaste of mouthwash.     _X__ 3.  No Alcohol for 24 hours before or after surgery.   _X__ 4.  Do Not Smoke or use e-cigarettes For 24 Hours Prior to Your Surgery.                 Do not use any chewable tobacco products for at least 6 hours prior to                 surgery.  ____  5.  Bring all medications with you on the day of surgery if instructed.   __X__  6.  Notify your doctor if there is any change in your medical condition      (cold, fever, infections).     Do not wear jewelry, make-up, hairpins, clips or nail polish. Do not wear lotions, powders, or perfumes.  Do not shave 48 hours prior to surgery. Men may shave face and neck. Do not bring valuables to the hospital.    [facility] is not responsible for  any belongings or valuables.  Contacts, dentures/partials or body piercings may not be worn into surgery. Bring a case for your contacts, glasses or hearing aids, a denture cup will be supplied. Leave your suitcase in the car. After surgery it may be brought to your room. For patients admitted to the hospital, discharge time is determined by your treatment team.   Patients discharged the day of surgery will not be allowed to drive home.   Please read over the following fact sheets that you were given:   MRSA Information  __X__ Take these medicines the morning of surgery with A SIP OF WATER:  1. NONE  2.   3.   4.  5.  6.  ____ Fleet Enema (as directed)   __X__ Use CHG Soap/SAGE wipes as directed  ____ Use inhalers on the day of surgery  ____ Stop metformin/Janumet/Farxiga 2 days prior to surgery    ____ Take 1/2 of usual insulin dose the night before surgery. No insulin the morning          of surgery.   ____ Stop Blood Thinners Coumadin/Plavix/Xarelto/Pleta/Pradaxa/Eliquis/Effient/Aspirin  on   Or contact your Surgeon, Cardiologist or Medical Doctor regarding  ability to stop your blood thinners  __X__ Stop Anti-inflammatories 7 days before surgery such as Advil, Ibuprofen, Motrin,  BC or Goodies Powder, Naprosyn, Naproxen, Aleve, Aspirin    __X__ Stop TODAY all herbal supplements, fish oil or vitamin E, GLUCOSAMINE/CHONDROITIN until after surgery.    ____ Bring C-Pap to the hospital.

## 2019-05-10 ENCOUNTER — Other Ambulatory Visit: Payer: Self-pay

## 2019-05-10 ENCOUNTER — Encounter
Admission: RE | Disposition: A | Discharge status: Home / Self Care | Payer: Self-pay | Source: Home / Self Care | Attending: General Surgery

## 2019-05-10 ENCOUNTER — Encounter: Payer: Self-pay | Admitting: *Deleted

## 2019-05-10 ENCOUNTER — Ambulatory Visit: Payer: Medicare Other | Admitting: Certified Registered"

## 2019-05-10 ENCOUNTER — Ambulatory Visit
Admission: RE | Admit: 2019-05-10 | Discharge: 2019-05-10 | Disposition: A | Discharge status: Home / Self Care | Payer: Medicare Other | Attending: General Surgery | Admitting: General Surgery

## 2019-05-10 DIAGNOSIS — Z87891 Personal history of nicotine dependence: Secondary | ICD-10-CM | POA: Diagnosis not present

## 2019-05-10 DIAGNOSIS — Z96651 Presence of right artificial knee joint: Secondary | ICD-10-CM | POA: Insufficient documentation

## 2019-05-10 DIAGNOSIS — H9190 Unspecified hearing loss, unspecified ear: Secondary | ICD-10-CM | POA: Insufficient documentation

## 2019-05-10 DIAGNOSIS — E210 Primary hyperparathyroidism: Secondary | ICD-10-CM

## 2019-05-10 DIAGNOSIS — I10 Essential (primary) hypertension: Secondary | ICD-10-CM | POA: Diagnosis not present

## 2019-05-10 DIAGNOSIS — Z79899 Other long term (current) drug therapy: Secondary | ICD-10-CM | POA: Diagnosis not present

## 2019-05-10 DIAGNOSIS — Z96641 Presence of right artificial hip joint: Secondary | ICD-10-CM | POA: Diagnosis not present

## 2019-05-10 DIAGNOSIS — E785 Hyperlipidemia, unspecified: Secondary | ICD-10-CM | POA: Insufficient documentation

## 2019-05-10 HISTORY — PX: PARATHYROIDECTOMY: SHX19

## 2019-05-10 LAB — PARATHYROID HORMONE, INTRAOP (ARMC ONLY)
Parathyroid Hormone: 108 pg/mL — ABNORMAL HIGH (ref 12–88)
Parathyroid Hormone: 26 pg/mL (ref 12–88)
Parathyroid Hormone: 43 pg/mL (ref 12–88)
Parathyroid Hormone: 19 pg/mL (ref 12–88)

## 2019-05-10 SURGERY — PARATHYROIDECTOMY
Anesthesia: General

## 2019-05-10 MED ORDER — ACETAMINOPHEN 500 MG PO TABS
1000.0000 mg | ORAL_TABLET | ORAL | Status: AC
  Administered 2019-05-10: 500 mg via ORAL

## 2019-05-10 MED ORDER — PHENYLEPHRINE HCL (PRESSORS) 10 MG/ML IV SOLN
INTRAVENOUS | Status: DC | PRN
  Administered 2019-05-10: 100 ug via INTRAVENOUS

## 2019-05-10 MED ORDER — FAMOTIDINE 20 MG PO TABS
20.0000 mg | ORAL_TABLET | Freq: Once | ORAL | Status: AC
  Administered 2019-05-10: 20 mg via ORAL

## 2019-05-10 MED ORDER — GLYCOPYRROLATE 0.2 MG/ML IJ SOLN
INTRAMUSCULAR | Status: DC | PRN
  Administered 2019-05-10: 0.2 mg via INTRAVENOUS

## 2019-05-10 MED ORDER — CHLORHEXIDINE GLUCONATE CLOTH 2 % EX PADS: 6.0000 | MEDICATED_PAD | Freq: Once | CUTANEOUS | Status: DC

## 2019-05-10 MED ORDER — ROCURONIUM BROMIDE 100 MG/10ML IV SOLN
INTRAVENOUS | Status: DC | PRN
  Administered 2019-05-10: 10 mg via INTRAVENOUS
  Administered 2019-05-10: 40 mg via INTRAVENOUS

## 2019-05-10 MED ORDER — LACTATED RINGERS IV SOLN
INTRAVENOUS | Status: DC
  Administered 2019-05-10: 10:00:00 via INTRAVENOUS

## 2019-05-10 MED ORDER — LIDOCAINE HCL (CARDIAC) PF 100 MG/5ML IV SOSY
PREFILLED_SYRINGE | INTRAVENOUS | Status: DC | PRN
  Administered 2019-05-10: 100 mg via INTRAVENOUS

## 2019-05-10 MED ORDER — ONDANSETRON HCL 4 MG/2ML IJ SOLN
INTRAMUSCULAR | Status: DC | PRN
  Administered 2019-05-10: 4 mg via INTRAVENOUS

## 2019-05-10 MED ORDER — FENTANYL CITRATE (PF) 100 MCG/2ML IJ SOLN
INTRAMUSCULAR | Status: AC
  Filled 2019-05-10: qty 2

## 2019-05-10 MED ORDER — DEXAMETHASONE SODIUM PHOSPHATE 10 MG/ML IJ SOLN
INTRAMUSCULAR | Status: DC | PRN
  Administered 2019-05-10: 10 mg via INTRAVENOUS

## 2019-05-10 MED ORDER — PROPOFOL 10 MG/ML IV BOLUS
INTRAVENOUS | Status: AC
  Filled 2019-05-10: qty 20

## 2019-05-10 MED ORDER — HEMOSTATIC AGENTS (NO CHARGE) OPTIME
TOPICAL | Status: DC | PRN
  Administered 2019-05-10: 1 via TOPICAL

## 2019-05-10 MED ORDER — SEVOFLURANE IN SOLN
RESPIRATORY_TRACT | Status: AC
  Filled 2019-05-10: qty 250

## 2019-05-10 MED ORDER — FAMOTIDINE 20 MG PO TABS
ORAL_TABLET | ORAL | Status: AC
  Filled 2019-05-10: qty 1

## 2019-05-10 MED ORDER — SODIUM CHLORIDE FLUSH 0.9 % IV SOLN
INTRAVENOUS | Status: AC
  Filled 2019-05-10: qty 10

## 2019-05-10 MED ORDER — LABETALOL HCL 5 MG/ML IV SOLN
INTRAVENOUS | Status: DC | PRN
  Administered 2019-05-10: 5 mg via INTRAVENOUS

## 2019-05-10 MED ORDER — SUCCINYLCHOLINE CHLORIDE 20 MG/ML IJ SOLN
INTRAMUSCULAR | Status: DC | PRN
  Administered 2019-05-10: 100 mg via INTRAVENOUS

## 2019-05-10 MED ORDER — FENTANYL CITRATE (PF) 100 MCG/2ML IJ SOLN
INTRAMUSCULAR | Status: DC | PRN
  Administered 2019-05-10: 50 ug via INTRAVENOUS

## 2019-05-10 MED ORDER — PROPOFOL 10 MG/ML IV BOLUS
INTRAVENOUS | Status: DC | PRN
  Administered 2019-05-10: 150 mg via INTRAVENOUS

## 2019-05-10 MED ORDER — MIDAZOLAM HCL 2 MG/2ML IJ SOLN
INTRAMUSCULAR | Status: AC
  Filled 2019-05-10: qty 2

## 2019-05-10 MED ORDER — MIDAZOLAM HCL 2 MG/2ML IJ SOLN
INTRAMUSCULAR | Status: DC | PRN
  Administered 2019-05-10: 2 mg via INTRAVENOUS

## 2019-05-10 MED ORDER — FENTANYL CITRATE (PF) 100 MCG/2ML IJ SOLN
25.0000 ug | INTRAMUSCULAR | Status: DC | PRN
  Administered 2019-05-10: 25 ug via INTRAVENOUS

## 2019-05-10 MED ORDER — ONDANSETRON HCL 4 MG/2ML IJ SOLN: 4.0000 mg | Freq: Once | INTRAMUSCULAR | Status: DC | PRN

## 2019-05-10 MED ORDER — ACETAMINOPHEN 500 MG PO TABS
ORAL_TABLET | ORAL | Status: AC
  Filled 2019-05-10: qty 1

## 2019-05-10 SURGICAL SUPPLY — 49 items
BACTOSHIELD CHG 4% 4OZ (MISCELLANEOUS) ×1
BASIN GRAD PLASTIC 32OZ STRL (MISCELLANEOUS) ×2 IMPLANT
BLADE BOVIE TIP EXT 4 (BLADE) ×2 IMPLANT
BLADE SURG 15 STRL LF DISP TIS (BLADE) ×1 IMPLANT
BLADE SURG 15 STRL SS (BLADE) ×1
CANISTER SUCT 1200ML W/VALVE (MISCELLANEOUS) ×2 IMPLANT
CLIP VESOCCLUDE SM WIDE 6/CT (CLIP) ×2 IMPLANT
COVER WAND RF STERILE (DRAPES) ×2 IMPLANT
DERMABOND ADVANCED (GAUZE/BANDAGES/DRESSINGS) ×1
DERMABOND ADVANCED .7 DNX12 (GAUZE/BANDAGES/DRESSINGS) ×1 IMPLANT
DRAPE MAG INST 16X20 L/F (DRAPES) ×2 IMPLANT
DRAPE THYROID T SHEET (DRAPES) ×2 IMPLANT
ELECT CAUTERY BLADE TIP 2.5 (TIP) ×2
ELECT LARYNGEAL DUAL CHAN (ELECTRODE) IMPLANT
ELECT NEEDLE 20X.3 GREEN (MISCELLANEOUS)
ELECT REM PT RETURN 9FT ADLT (ELECTROSURGICAL) ×2
ELECTRODE CAUTERY BLDE TIP 2.5 (TIP) ×1 IMPLANT
ELECTRODE NEEDLE 20X.3 GREEN (MISCELLANEOUS) IMPLANT
ELECTRODE REM PT RTRN 9FT ADLT (ELECTROSURGICAL) ×1 IMPLANT
EVICEL AIRLESS SPRAY ACCES (MISCELLANEOUS) IMPLANT
GAUZE 4X4 16PLY RFD (DISPOSABLE) ×2 IMPLANT
GLOVE BIO SURGEON STRL SZ 6.5 (GLOVE) ×8 IMPLANT
GLOVE INDICATOR 7.0 STRL GRN (GLOVE) ×6 IMPLANT
GOWN STRL REUS W/ TWL LRG LVL3 (GOWN DISPOSABLE) ×4 IMPLANT
GOWN STRL REUS W/TWL LRG LVL3 (GOWN DISPOSABLE) ×4
KIT TURNOVER KIT A (KITS) ×2 IMPLANT
LABEL OR SOLS (LABEL) ×2 IMPLANT
NDL SAFETY ECLIPSE 18X1.5 (NEEDLE) ×4 IMPLANT
NEEDLE HYPO 18GX1.5 SHARP (NEEDLE) ×4
NEEDLE HYPO 25X1 1.5 SAFETY (NEEDLE) ×8 IMPLANT
NERVE STIMULATOR WAVEFORM 16S (NEUROSURGERY SUPPLIES)
NS IRRIG 500ML POUR BTL (IV SOLUTION) ×2 IMPLANT
PACK BASIN MINOR ARMC (MISCELLANEOUS) ×2 IMPLANT
PROBE NEUROSIGN BIPOL (MISCELLANEOUS) IMPLANT
PROBE NEUROSIGN BIPOLAR (MISCELLANEOUS)
SCRUB CHG 4% DYNA-HEX 4OZ (MISCELLANEOUS) ×1 IMPLANT
SHEARS HARMONIC 9CM CVD (BLADE) IMPLANT
SPONGE KITTNER 5P (MISCELLANEOUS) ×2 IMPLANT
STIMULATOR NERVE WAVEFORM 16S (NEUROSURGERY SUPPLIES) IMPLANT
STRIP CLOSURE SKIN 1/2X4 (GAUZE/BANDAGES/DRESSINGS) ×2 IMPLANT
SURGICEL SNOW 2X4 (HEMOSTASIS) ×2 IMPLANT
SUT MNCRL AB 4-0 PS2 18 (SUTURE) IMPLANT
SUT PROLENE 4 0 PS 2 18 (SUTURE) ×2 IMPLANT
SUT SILK 2 0 (SUTURE) ×1
SUT SILK 2-0 18XBRD TIE 12 (SUTURE) ×1 IMPLANT
SUT VIC AB 4-0 RB1 27 (SUTURE) ×1
SUT VIC AB 4-0 RB1 27X BRD (SUTURE) ×1 IMPLANT
SYR 3ML LL SCALE MARK (SYRINGE) ×8 IMPLANT
SYR BULB IRRIG 60ML STRL (SYRINGE) ×2 IMPLANT

## 2019-05-10 NOTE — Anesthesia Post-op Follow-up Note (Signed)
Anesthesia QCDR form completed.        

## 2019-05-10 NOTE — Op Note (Signed)
Operative Note  Preoperative Diagnosis:  primary hyperparathyroidism  Postoperative Diagnosis:  primary hyperparathyroidism  Operation:  focused/minimally invasive parathyroidectomy with intraoperative PTH monitoring  Surgeon: Fredirick Maudlin, MD  Assistant: Nestor Lewandowsky, MD (a second surgeon was necessary for adequate exposure and no other qualified assistant was available)  Anesthesia: General endotracheal  Findings: There was an enlarged left inferior parathyroid gland just caudal to the lobe of the thyroid.  Intraoperative PTH levels are as follows: Baseline: 108 Pre-excision: 43 5-minute: 26 10-minute: 19 This is a greater than 50% drop and signifies adequate excision of all hypersecreting tissue.  Indications: Alison Chandler is a 78 year old woman who was found to have elevated calcium levels on routine laboratory studies.  Subsequent investigation revealed biochemical findings consistent with hyperparathyroidism.  She has osteopenia as well as nephrolithiasis and secondary symptoms.  She met criteria for surgery.  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  Procedure In Detail: The patient was identified in the preoperative holding area and brought to the operating room where she was placed supine on the OR table.  Bony prominences were padded and bilateral sequential compression devices were placed on the lower extremities.  General endotracheal anesthesia was induced without incident.  The patient was positioned appropriately for the operation and sterilely prepped and draped in standard fashion.  A timeout was performed confirming the patient's identity, the procedure being performed, her allergies, all necessary equipment was available, and that maintenance anesthesia was adequate.  A 5 cm transverse incision was made midway between the sternal notch and thyroid cartilage.  This was carried down through the subcutaneous tissues and platysma using  electrocautery.  Subplatysmal flaps were elevated and the strap muscles were divided in the median raphae.  We elevated the sternohyoid off of the sternothyroid on the left to expose the internal jugular vein.  A baseline PTH was drawn and sent to the lab.  We then elevated the sternothyroid off the thyroid gland.  We explored the inferior pole and immediately identified an enlarged parathyroid gland.  It was carefully dissected free from the surrounding tissues and elevated on its vascular pedicle which was then divided.  It was handed off as a specimen.  At the time of excision, we drew a PTH level.  5 and 10 minutes post excision, we drew additional PTH levels and sent all of these to the lab.  We irrigated the wound bed and obtain good hemostasis.  A Valsalva maneuver from the anesthesia team confirmed no ongoing surgical bleeding.  We applied SNOW for additional hemostasis.  The strap muscles were closed in the midline with running 4-0 Vicryl and the platysma was closed with interrupted Vicryl.  The skin was closed with running subcuticular Prolene.  We awaited the results of her PTH levels and these returned as discussed under findings.  The skin was cleaned and Dermabond and Steri-Strips were applied.  The Prolene suture was removed.  The patient was awakened, extubated, and taken to the postanesthesia care unit in good condition.  EBL: Less than 10 cc  IVF: See anesthesia record  Specimen(s): Left inferior parathyroid gland pathology for permanent section  Complications: none immediately apparent.   Counts: all needles, instruments, and sponges were counted and reported to be correct in number at the end of the case.   I was present for and participated in the entire operation.  Fredirick Maudlin  11:50 AM

## 2019-05-10 NOTE — Discharge Instructions (Signed)
Post-operative Home Care After Thyroid or Parathyroid Surgery  What should I expect after my operation?   . You will see swelling under the incision and/or bruising under it in a few days. This is usually greatest on the second or third day after the operation. You may also feel the sensation of swelling and/or of firmness that can last for a month or more.    . Neck incisions heal rapidly, within a week or two. You can get them damp after about 24 hours, however do not submerge the incision or allow it to become soaked or saturated with water or sweat for 2 weeks.   . Your scar will be most visible 1-2 months after the operation and gradually fade over the next 6-8 months. As it heals, a scar looks more pink or red than the skin around it.   . You may feel a firm 'healing ridge' directly under the incision. This is normal and will soften and go away when healing is complete within 3-6 months.   . All incisions are sensitive to sunlight.  For one year after surgery, you should use sunscreen when outdoors for long periods to prevent darkening of the scar area.    . We recommend that you not expose the incision to the ultraviolet lights used in tanning booths.    Will my neck hurt?  . Most patients experience very little pain, but you may feel some neck stiffness/soreness in your shoulder, back or neck and tension headache that takes a few days or weeks to go away completely.    . Some patients also notice minor changes in swallowing, which improve over time.   . The skin just above and below your incision will feel numb. This will improve over several months, but some persons may have a longterm decrease in sensation.   .  You may apply cold pack over your incision to improve the pain.    How will I manage my pain at home?  . Take NSAIDS like ibuprofen (Motrin, Advil), naproxen (Naprosyn, Aleve) or acetaminophen (Tylenol) every 6 hours for the first 3-5 days after operation. This will  minimize the pain you feel.      ? To prevent Tylenol overdose, do not take a Tylenol doses at the same time as a combination narcotic dose that contains Tylenol, like Vicodin and Percocet. However, You may take them 4-6 hours apart.     . If you have sore/stiff muscles in your back, shoulder or neck, you may use moist warm heat or heating pad on the affected areas 15-20 minutes at a time several times a day.   . Gently massaging your neck muscles will improve the neck stiffness.    . Do not be afraid to move your neck. Gently flexing and stretching your neck muscles will prevent stiffness.    . Stronger pain medication or narcotic (like Vicodin, Percocet) for severe pain is rarely needed. If it is, however, DO NOT drive a car or drink alcohol while taking these medications.    . Narcotics also cause constipation. Stool softeners (Colace) and fiber (fruits, bran, vegetables) and extra fluid intake helps. A stimulant laxative (Milk of Magnesia, Senokot) may be needed as well.    Will my voice be affected?  Your voice may be hoarse or weak at first, because the surgery took place near the voice box, but usually recovers within weeks. Some patients also notice a change in the pitch of their voices that affects   singing. Rarely, these changes can be permanent.   Are there any diet restrictions?  No, return to your previous diet and always eat a well balanced diet, low in fat, etc.    How will I care for my incision?  . If you have paper "steri strips" on your incision, leave them in place until they begin to fall off naturally. If they become discolored or messy, you may remove them 7-10 days after your operation.   . If you have a skin glue (Dermabond) closure, you may notice tiny pieces of yellow material on your washcloth. Any sutures (stitches) you may have are dissolvable and do not need to be removed.  . You may shower then gently pat dry your incision.   . Do not apply ointments or  powders.   . Avoid using Vitamin E cream or other moisturizers on the incision until after your first follow-up visit.   What new medications might I take home?   ? Calcium supplement:  Your body's calcium levels may fall after a total thyroidectomy or parathyroid operation. We recommend you purchase Os-Cal 500 (one tablet equals 500 mg of elemental calcium) or Citracal (2 tablets equal 630 mg of elemental calcium; the "Petites" version contains 500 mg elemental calcium per 2 tablets). You may be taking 3-6 (or more) tablets per day, depending on your doctor's recommendation. You will need to take calcium at different times to avoid medication interaction. Ask your pharmacists, nurse, or doctor about specific interactions.   ? Thyroid Hormone:  If you have had a thyroid operation, you may be prescribed thyroid hormone replacement, called levothyroxine (Synthroid, Levothroid, etc.). A blood test will be done in 6-8 weeks to ensure the dosage is correct  by your doctor or your surgeon.   ? Vitamin D:  If your doctor has prescribed a Vitamin D supplement, like Calcitriol (Rocaltrol), try to get it filled at the hospital pharmacy before you leave.  Sometimes regular pharmacies do not stock it and will need to order it in.  When can I go back to normal activities?  . You may return to work in 5-7 days or sooner if desired. Contact the clinic coordinator if you need employer forms completed.   . You may drive as long as you are not taking any narcotics and your neck stiffness is resolved    Can I resume my previous medications?   . Yes, unless directed not to by your doctor.   . Before discharge, be sure to review your previous medications with your doctor or inpatient medical team.    When do I call for advice?   - If your temperature > 101.5  - You have trouble talking or breathing  - Your fingers/ hands or face or around your lips becomes numb and tingling. (This may mean your calcium level is  low.)  - You have trouble swallowing  - Your incision becomes swollen, red or drainage occurs.       Please start taking 1 tablets of either Os-Cal 500 or Citrical 3 times daily. You may use Tums, instead, but please be sure to get the "ultra" variety.  Calcium can be quite constipating, so be sure to drink at least 64 oz of water or other non-caffeinated beverage daily. You can still drink caffeine in addition to this.  If necessary, you may use over the counter stool softeners or fiber supplements to help with constipation.    AMBULATORY SURGERY  DISCHARGE INSTRUCTIONS     1) The drugs that you were given will stay in your system until tomorrow so for the next 24 hours you should not:  A) Drive an automobile B) Make any legal decisions C) Drink any alcoholic beverage   2) You may resume regular meals tomorrow.  Today it is better to start with liquids and gradually work up to solid foods.  You may eat anything you prefer, but it is better to start with liquids, then soup and crackers, and gradually work up to solid foods.   3) Please notify your doctor immediately if you have any unusual bleeding, trouble breathing, redness and pain at the surgery site, drainage, fever, or pain not relieved by medication.    4) Additional Instructions:        Please contact your physician with any problems or Same Day Surgery at [phone], Monday through Friday 6 am to 4 pm, or [place] [facility] [place] [facility] number at [phone]. 

## 2019-05-10 NOTE — H&P (Signed)
Alison Chandler is a 78 y.o. female.   She has been referred by her primary care doctor, Dr. Ward Givens, for surgical evaluation of primary hyperparathyroidism.  Alison Chandler states that she saw Dr. Harrell Lark for routine blood work.  Her calcium was found to be slightly elevated.  On recheck, her level was down somewhat, but to further investigate, Dr. Harrell Lark ordered an intact PTH level.  This was found to be elevated.  Her vitamin D level is normal.  She has not taken any thiazide diuretics nor any bisphosphonates.  Alison Chandler states that on retrospect, she realized that her calcium has been high several years ago, prior to beginning to follow with Dr. Toula Moos.  She had been taking calcium supplements at the time.  Her old primary care provider had her decrease the amount, but did not further investigate.  Dr. Harrell Lark appropriately discontinued calcium supplementation upon discovery of hypercalcemia with associated high intact PTH.  Alison Chandler does have a history of gastroesophageal reflux disease, but has never had peptic ulcer disease.  She endorses polyuria without polydipsia.  She has never had pancreatitis.  She does have a history of nephrolithiasis.  She endorses fatigue.  She has never had a pathologic fracture, although a recent bone density study was consistent with osteopenia.  She feels like her memory is not as good as it used to be.  She endorses muscle aches, specifically in her thighs.  She has nocturia x2 but otherwise endorses no sleep disturbances.  No pruritus.  She has never had any head or neck irradiation.  She does not know of any family history of elevated calcium levels, thyroid cancer, pituitary, pancreas, or adrenal tumors.  Her mother did take thyroid hormone replacement, but no other endocrinopathies are known.  She was recently diagnosed with hypertension and has been initiated on atenolol.  Past Medical History:  Diagnosis Date  . Arthritis   . Hearing loss   . History of kidney stones   .  Hyperlipidemia   . Hypertension    Past Surgical History:  Procedure Laterality Date  . colonscopy  01-27-2012  . JOINT REPLACEMENT Right    THR  . KIDNEY STONE SURGERY    . 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 Thyroid disease Mother   . Lung cancer Mother 93  . Breast cancer Mother 40  . Parkinson's disease Father    Social History   Socioeconomic History  . Marital status: Married    Spouse name: Not on file  . Number of children: Not on file  . Years of education: Not on file  . Highest education level: Not on file  Occupational History  . Not on file  Social Needs  . Financial resource strain: Not on file  . Food insecurity    Worry: Not on file    Inability: Not on file  . Transportation needs    Medical: Not on file    Non-medical: Not on file  Tobacco Use  . Smoking status: Former Smoker    Packs/day: 0.25    Years: 20.00    Pack years: 5.00    Quit date: 09/01/1971    Years since quitting: 47.7  . Smokeless tobacco: Never Used  Substance and Sexual Activity  . Alcohol use: Yes  Alcohol/week: 1.0 standard drinks    Types: 1 Cans of beer per week  . Drug use: No  . Sexual activity: Not on file  Lifestyle  . Physical activity    Days per week: Not on file    Minutes per session: Not on file  . Stress: Not on file  Relationships  . Social Herbalist on phone: Not on file    Gets together: Not on file    Attends religious service: Not on file    Active member of club or organization: Not on file    Attends meetings of clubs or organizations: Not on file    Relationship status: Not on file  . Intimate partner violence    Fear of current or ex partner: Not on file    Emotionally abused: Not on file    Physically abused: Not on file     Forced sexual activity: Not on file  Other Topics Concern  . Not on file  Social History Narrative  . Not on file   Current Meds  Medication Sig  . acetaminophen (TYLENOL) 325 MG tablet Take 325 mg by mouth every 6 (six) hours as needed for moderate pain or headache.  Marland Kitchen atenolol (TENORMIN) 50 MG tablet Take 50 mg by mouth every evening.   Marland Kitchen atorvastatin (LIPITOR) 20 MG tablet Take 20 mg by mouth every evening.   . Cholecalciferol (DIALYVITE VITAMIN D 5000) 125 MCG (5000 UT) capsule Take 5,000 Units by mouth every evening.  Marland Kitchen GLUCOSAMINE-CHONDROITIN PO Take 1 tablet by mouth every evening.  . naproxen sodium (ALEVE) 220 MG tablet Take 220 mg by mouth 2 (two) times daily as needed (pain).   . Omega-3 Fatty Acids (FISH OIL) 1200 MG CAPS Take 1,200 mg by mouth every evening.  Vladimir Faster Glycol-Propyl Glycol (SYSTANE OP) Place 1 drop into both eyes daily as needed (dry eyes).  . vitamin B-12 (CYANOCOBALAMIN) 1000 MCG tablet Take 1,000 mcg by mouth every evening.   Allergies  Allergen Reactions  . Actonel [Risedronate Sodium] Nausea Only  . Amlodipine Nausea Only   Today's Vitals   05/10/19 0822  BP: (!) 185/73  Pulse: 62  Resp: 12  Temp: 98.4 F (36.9 C)  TempSrc: Oral  SpO2: 99%  PainSc: 0-No pain   There is no height or weight on file to calculate BMI. Physical Exam  Constitutional: She is oriented to person, place, and time. She appears well-developed and well-nourished. No distress.  HENT:  Head: Normocephalic and atraumatic.  Eyes: Right eye exhibits no discharge. Left eye exhibits no discharge. No scleral icterus.  Neck: Normal range of motion. Neck supple. No tracheal deviation present. No thyromegaly present.  Cardiovascular: Normal rate and regular rhythm.  Pulmonary/Chest: Effort normal.  Abdominal: Soft. Bowel sounds are normal.  Musculoskeletal: Normal range of motion.  Neurological: She is alert and oriented to person, place, and time.  Skin: Skin is warm and  dry.  Psychiatric: She has a normal mood and affect. Her behavior is normal.    Plan: I discussed the details of surgery with her and her husband.  I explained that typically, it is a single gland or less frequently, 2 glands that are abnormal.  Occasionally, patients will have multi-gland hyperplasia.  During the operation, I will monitor intact PTH levels to ensure that all hyper secreting tissue has been removed.  We discussed the risks of the procedure. The risks of parathyroid surgery were discussed with the patient, including (but  not limited to): bleeding, infection, damage to surrounding structures/tissues, injury (temporary or permanent) to the recurrent laryngeal nerve, hypocalcemia (temporary or permanent), need to take calcium and/or vitamin D supplementation, recurrent hyperparathyroidism, failure to correct hyperparathyroidism, need for additional surgery.  The patient had the opportunity to ask any questions and these were answered to their satisfaction.  Pre-op imaging suggests a left-sided adenoma.

## 2019-05-10 NOTE — Transfer of Care (Signed)
Immediate Anesthesia Transfer of Care Note  Patient: Alison Chandler  Procedure(s) Performed: PARATHYROIDECTOMY (N/A )  Patient Location: PACU  Anesthesia Type:General  Level of Consciousness: awake, alert  and patient cooperative  Airway & Oxygen Therapy: Patient Spontanous Breathing and Patient connected to face mask oxygen  Post-op Assessment: Report given to RN and Post -op Vital signs reviewed and stable  Post vital signs: Reviewed and stable  Last Vitals:  Vitals Value Taken Time  BP 157/88 05/10/19 1142  Temp    Pulse 83 05/10/19 1145  Resp 19 05/10/19 1144  SpO2 97 % 05/10/19 1145  Vitals shown include unvalidated device data.  Last Pain:  Vitals:   05/10/19 0822  TempSrc: Oral  PainSc: 0-No pain         Complications: No apparent anesthesia complications

## 2019-05-10 NOTE — Anesthesia Procedure Notes (Signed)
Procedure Name: Intubation Performed by: Kelton Pillar, CRNA Pre-anesthesia Checklist: Patient identified, Suction available, Emergency Drugs available and Patient being monitored Patient Re-evaluated:Patient Re-evaluated prior to induction Oxygen Delivery Method: Circle system utilized Preoxygenation: Pre-oxygenation with 100% oxygen Induction Type: IV induction Ventilation: Mask ventilation without difficulty Laryngoscope Size: McGraph and 3 Grade View: Grade I Tube type: Oral Tube size: 7.0 mm Number of attempts: 1 Airway Equipment and Method: Stylet Placement Confirmation: ETT inserted through vocal cords under direct vision,  positive ETCO2,  CO2 detector and breath sounds checked- equal and bilateral Secured at: 22 cm Tube secured with: Tape Dental Injury: Teeth and Oropharynx as per pre-operative assessment

## 2019-05-10 NOTE — Anesthesia Preprocedure Evaluation (Signed)
Anesthesia Evaluation  Patient identified by MRN, date of birth, ID band Patient awake    Reviewed: Allergy & Precautions, H&P , NPO status , Patient's Chart, lab work & pertinent test results, reviewed documented beta blocker date and time   Airway Mallampati: III  TM Distance: <3 FB Neck ROM: Full    Dental  (+) Teeth Intact, Dental Advisory Given   Pulmonary neg pulmonary ROS, former smoker,    breath sounds clear to auscultation       Cardiovascular hypertension, Pt. on medications and Pt. on home beta blockers negative cardio ROS   Rhythm:Regular Rate:Normal  Denies HTN, borderline, no Rx Denies cardiac symptoms   Neuro/Psych negative neurological ROS  negative psych ROS   GI/Hepatic negative GI ROS, Neg liver ROS,   Endo/Other  negative endocrine ROS  Renal/GU   negative genitourinary   Musculoskeletal  (+) Arthritis , THR-right   Abdominal   Peds negative pediatric ROS (+)  Hematology negative hematology ROS (+)   Anesthesia Other Findings Past Medical History: No date: Arthritis No date: Hearing loss No date: History of kidney stones No date: Hyperlipidemia No date: Hypertension  Reproductive/Obstetrics negative OB ROS                             Anesthesia Physical  Anesthesia Plan  ASA: II  Anesthesia Plan: General   Post-op Pain Management:    Induction: Intravenous  PONV Risk Score and Plan:   Airway Management Planned: Oral ETT  Additional Equipment:   Intra-op Plan:   Post-operative Plan: Extubation in OR  Informed Consent: I have reviewed the patients History and Physical, chart, labs and discussed the procedure including the risks, benefits and alternatives for the proposed anesthesia with the patient or authorized representative who has indicated his/her understanding and acceptance.     Dental advisory given  Plan Discussed with: CRNA and  Surgeon  Anesthesia Plan Comments:         Anesthesia Quick Evaluation

## 2019-05-11 LAB — SURGICAL PATHOLOGY

## 2019-05-11 NOTE — Anesthesia Postprocedure Evaluation (Signed)
Anesthesia Post Note  Patient: Alison Chandler  Procedure(s) Performed: PARATHYROIDECTOMY (N/A )  Patient location during evaluation: PACU Anesthesia Type: General Level of consciousness: awake and alert and oriented Pain management: pain level controlled Vital Signs Assessment: post-procedure vital signs reviewed and stable Respiratory status: spontaneous breathing Cardiovascular status: blood pressure returned to baseline Anesthetic complications: no     Last Vitals:  Vitals:   05/10/19 1232 05/10/19 1304  BP: (!) 142/73 (!) 161/77  Pulse: (!) 56 (!) 55  Resp: 16 16  Temp:  (!) 36.2 C  SpO2: 98% 97%    Last Pain:  Vitals:   05/11/19 1026  TempSrc:   PainSc: 2                  Esbeydi Manago

## 2019-05-25 ENCOUNTER — Ambulatory Visit (INDEPENDENT_AMBULATORY_CARE_PROVIDER_SITE_OTHER): Payer: Medicare Other | Admitting: General Surgery

## 2019-05-25 ENCOUNTER — Other Ambulatory Visit: Payer: Self-pay

## 2019-05-25 ENCOUNTER — Encounter: Payer: Self-pay | Admitting: General Surgery

## 2019-05-25 VITALS — BP 179/103 | HR 70 | Temp 97.8°F | Ht 67.0 in | Wt 229.0 lb

## 2019-05-25 DIAGNOSIS — Z9889 Other specified postprocedural states: Secondary | ICD-10-CM

## 2019-05-25 DIAGNOSIS — E892 Postprocedural hypoparathyroidism: Secondary | ICD-10-CM

## 2019-05-25 NOTE — Progress Notes (Signed)
Alison Chandler is here today for a postoperative visit after undergoing a single gland parathyroidectomy on May 10, 2019.  An enlarged left inferior parathyroid gland was removed and intraoperative PTH dropped from 108 to19.  She reports doing well since her operation.  She denies any difficulty with pain control.  Her voice and swallowing are normal.  She denies any paresthesias or muscle cramping.  She has been taking 1 tablet of calcium citrate 3 times daily.  Today's Vitals   05/25/19 0925  BP: (!) 179/103  Pulse: 70  Temp: 97.8 F (36.6 C)  TempSrc: Skin  SpO2: 98%  Weight: 229 lb (103.9 kg)  Height: 5\' 7"  (1.702 m)   Body mass index is 35.87 kg/m. Focused examination: The transverse surgical scar on her neck is healing nicely.  There is no erythema, induration, or purulent drainage identified.  There is a small amount of expected postsurgical swelling.  She does have a healing ridge present.   Impression and plan: Alison Chandler is a 78 year old woman who had primary hyperparathyroidism.  She underwent a minimally invasive parathyroidectomy and had an excellent drop in her intraoperative parathyroid hormone levels.  She is doing well from an operative standpoint and I expect this to be a curative operation for her.  I have suggested that she drop her calcium supplementation down from 3 times daily to twice daily, which she should remain on for bone health.  She was instructed in scar care today, including massage with an emollient agent to minimize tethering and improve the cosmesis, as well as use of sunscreen anytime ultraviolet light exposure is expected.  We will see her on an as-needed basis.

## 2019-05-25 NOTE — Patient Instructions (Addendum)
Return as needed.The patient is aware to call back for any questions or concerns.  Patient to take her calcium 2 tabs daily. May use vitamin e oil on the area.

## 2019-07-24 ENCOUNTER — Other Ambulatory Visit: Payer: Self-pay

## 2019-07-24 ENCOUNTER — Ambulatory Visit (INDEPENDENT_AMBULATORY_CARE_PROVIDER_SITE_OTHER): Payer: Medicare Other

## 2019-07-24 ENCOUNTER — Ambulatory Visit (INDEPENDENT_AMBULATORY_CARE_PROVIDER_SITE_OTHER): Payer: Medicare Other | Admitting: Orthopaedic Surgery

## 2019-07-24 DIAGNOSIS — M25561 Pain in right knee: Secondary | ICD-10-CM

## 2019-07-24 MED ORDER — NAPROXEN 500 MG PO TABS: 500.0000 mg | ORAL_TABLET | Freq: Two times a day (BID) | ORAL | 1 refills | Status: DC | PRN

## 2019-07-24 MED ORDER — LIDOCAINE HCL 1 % IJ SOLN
3.0000 mL | INTRAMUSCULAR | Status: AC | PRN
  Administered 2019-07-24: 3 mL

## 2019-07-24 MED ORDER — METHYLPREDNISOLONE ACETATE 40 MG/ML IJ SUSP
40.0000 mg | INTRAMUSCULAR | Status: AC | PRN
  Administered 2019-07-24: 11:00:00 40 mg via INTRA_ARTICULAR

## 2019-07-24 NOTE — Progress Notes (Signed)
Office Visit Note   Patient: Alison Chandler           Date of Birth: October 19, 1940           MRN: [id number] Visit Date: 07/24/2019              Requested by: Verdell Carmine., MD [address] [place],  [place] [address] PCP: Verdell Carmine., MD   Assessment & Plan: Visit Diagnoses:  1. Acute pain of right knee     Plan: I was able to aspirate 20 cc of fluid from her right knee.  I then placed a steroid injection in it without difficulty.  She will continue her hinged knee brace and I would like to see her back in just 2 weeks to see how she is doing overall.  Given the well-maintained medial lateral compartments I would recommend a MRI of her right knee to rule out meniscal tear if she continues to have pain in the back of her knee with mobility.  Follow-Up Instructions: Return in about 2 weeks (around 08/07/2019).   Orders:  Orders Placed This Encounter  Procedures  . Large Joint Inj  . XR Knee 1-2 Views Right   No orders of the defined types were placed in this encounter.     Procedures: Large Joint Inj: R knee on 07/24/2019 11:29 AM Indications: diagnostic evaluation and pain Details: 22 G 1.5 in needle, superolateral approach  Arthrogram: No  Medications: 3 mL lidocaine 1 %; 40 mg methylPREDNISolone acetate 40 MG/ML Outcome: tolerated well, no immediate complications Procedure, treatment alternatives, risks and benefits explained, specific risks discussed. Consent was given by the patient. Immediately prior to procedure a time out was called to verify the correct patient, procedure, equipment, support staff and site/side marked as required. Patient was prepped and draped in the usual sterile fashion.       Clinical Data: No additional findings.   Subjective: Chief Complaint  Patient presents with  . Right Knee - Pain  The patient comes in for evaluation treatment of acute right knee pain.  She is ambulate with a walker.  She is a very active 78 year old  female.  She says the pain is in the back of her knee.  She has had some locking catching and she says it is a different pain and not the same all pain that she is dealt with in the past.  She is requesting naproxen as well.  HPI  Review of Systems She currently denies any headache, chest pain, shortness of breath, fever, chills, nausea, vomiting  Objective: Vital Signs: There were no vitals taken for this visit.  Physical Exam She is alert and orient x3 and in no acute distress Ortho Exam Examination of her right knee does show a small effusion.  She has pain with flexion past 90 degrees on the popliteal area of the knee.  She has medial and lateral joint line tenderness more so on the medial side. Specialty Comments:  No specialty comments available.  Imaging: Xr Knee 1-2 Views Right  Result Date: 07/24/2019 2 views of the right knee show well-maintained medial lateral compartments.  There is patellofemoral narrowing with arthritic changes.    PMFS History: Patient Active Problem List   Diagnosis Date Noted  . Primary hyperparathyroidism ([place])   . Acute pain of right shoulder 05/04/2018  . Chronic pain of left knee 12/22/2017  . It band syndrome, left 08/11/2017  . Degenerative arthritis of hip 02/19/2012  Past Medical History:  Diagnosis Date  . Arthritis   . Hearing loss   . History of kidney stones   . Hyperlipidemia   . Hypertension     Family History  Problem Relation Age of Onset  . Thyroid disease Mother   . Lung cancer Mother 6  . Breast cancer Mother 35  . Parkinson's disease Father     Past Surgical History:  Procedure Laterality Date  . colonscopy  01-27-2012  . JOINT REPLACEMENT Right    THR  . KIDNEY STONE SURGERY    . PARATHYROIDECTOMY N/A 05/10/2019   Procedure: PARATHYROIDECTOMY;  Surgeon: Fredirick Maudlin, MD;  Location: [facility] ORS;  Service: General;  Laterality: N/A;  . TOTAL HIP ARTHROPLASTY  02/19/2012   Procedure: TOTAL HIP ARTHROPLASTY  ANTERIOR APPROACH;  Surgeon: Mcarthur Rossetti, MD;  Location: [facility];  Service: Orthopedics;  Laterality: Right;   Social History   Occupational History  . Not on file  Tobacco Use  . Smoking status: Former Smoker    Packs/day: 0.25    Years: 20.00    Pack years: 5.00    Quit date: 09/01/1971    Years since quitting: 47.9  . Smokeless tobacco: Never Used  Substance and Sexual Activity  . Alcohol use: Yes    Alcohol/week: 1.0 standard drinks    Types: 1 Cans of beer per week  . Drug use: No  . Sexual activity: Not on file

## 2019-08-07 ENCOUNTER — Encounter: Payer: Self-pay | Admitting: Orthopaedic Surgery

## 2019-08-07 ENCOUNTER — Other Ambulatory Visit: Payer: Self-pay

## 2019-08-07 ENCOUNTER — Ambulatory Visit (INDEPENDENT_AMBULATORY_CARE_PROVIDER_SITE_OTHER): Payer: Medicare Other | Admitting: Orthopaedic Surgery

## 2019-08-07 DIAGNOSIS — M25561 Pain in right knee: Secondary | ICD-10-CM | POA: Diagnosis not present

## 2019-08-07 NOTE — Progress Notes (Signed)
Office Visit Note   Patient: Alison Chandler           Date of Birth: 1941/06/24           MRN: [id number] Visit Date: 08/07/2019              Requested by: Verdell Carmine., MD [address] [place],  [place] [address] PCP: Verdell Carmine., MD   Assessment & Plan: Visit Diagnoses:  1. Acute pain of right knee     Plan:  Given patient's continued pain in the knee and recurrent effusion recommend MRI to evaluate for meniscal tear.  Have her follow-up after the MRI to go over results and discuss further treatment.  Questions encouraged and answered at length.  Follow-Up Instructions: Return After MRI.   Orders:  No orders of the defined types were placed in this encounter.  No orders of the defined types were placed in this encounter.     Procedures: No procedures performed   Clinical Data: No additional findings.   Subjective: Chief Complaint  Patient presents with  . Right Knee - Follow-up    HPI Alison Chandler returns today 2 weeks status post right knee injection aspiration.  States the injection helped about 50%.  She mostly medial aspect of the knee but sometimes lateral aspect also.  Has a clicking within the knee but this is nonpainful.  Otherwise no mechanical symptoms.  Pain is worse with weightbearing. Review of Systems No fevers or chills.  Objective: Vital Signs: There were no vitals taken for this visit.  Physical Exam Constitutional:      Appearance: She is not ill-appearing or diaphoretic.  Pulmonary:     Effort: Pulmonary effort is normal.  Neurological:     Mental Status: She is alert and oriented to person, place, and time.     Ortho Exam Right knee positive effusion no abnormal warmth erythema.  McMurray's is positive.  Slight tenderness along medial joint line.  No instability.  Overall good range of motion of the right  knee. Specialty Comments:  No specialty comments available.  Imaging: No results found.   PMFS History: Patient  Active Problem List   Diagnosis Date Noted  . Primary hyperparathyroidism ([place])   . Acute pain of right shoulder 05/04/2018  . Chronic pain of left knee 12/22/2017  . It band syndrome, left 08/11/2017  . Degenerative arthritis of hip 02/19/2012   Past Medical History:  Diagnosis Date  . Arthritis   . Hearing loss   . History of kidney stones   . Hyperlipidemia   . Hypertension     Family History  Problem Relation Age of Onset  . Thyroid disease Mother   . Lung cancer Mother 57  . Breast cancer Mother 39  . Parkinson's disease Father     Past Surgical History:  Procedure Laterality Date  . colonscopy  01-27-2012  . JOINT REPLACEMENT Right    THR  . KIDNEY STONE SURGERY    . PARATHYROIDECTOMY N/A 05/10/2019   Procedure: PARATHYROIDECTOMY;  Surgeon: Fredirick Maudlin, MD;  Location: [facility] ORS;  Service: General;  Laterality: N/A;  . TOTAL HIP ARTHROPLASTY  02/19/2012   Procedure: TOTAL HIP ARTHROPLASTY ANTERIOR APPROACH;  Surgeon: Mcarthur Rossetti, MD;  Location: [facility];  Service: Orthopedics;  Laterality: Right;   Social History   Occupational History  . Not on file  Tobacco Use  . Smoking status: Former Smoker    Packs/day: 0.25    Years:  20.00    Pack years: 5.00    Quit date: 09/01/1971    Years since quitting: 47.9  . Smokeless tobacco: Never Used  Substance and Sexual Activity  . Alcohol use: Yes    Alcohol/week: 1.0 standard drinks    Types: 1 Cans of beer per week  . Drug use: No  . Sexual activity: Not on file

## 2019-08-07 NOTE — Addendum Note (Signed)
Addended by: Michae Kava B on: 08/07/2019 01:52 PM   Modules accepted: Orders

## 2019-08-12 ENCOUNTER — Other Ambulatory Visit: Payer: Self-pay

## 2019-08-12 ENCOUNTER — Ambulatory Visit (HOSPITAL_BASED_OUTPATIENT_CLINIC_OR_DEPARTMENT_OTHER)
Admission: RE | Admit: 2019-08-12 | Discharge: 2019-08-12 | Disposition: A | Discharge status: Home / Self Care | Payer: Medicare Other | Source: Ambulatory Visit | Attending: Orthopaedic Surgery | Admitting: Orthopaedic Surgery

## 2019-08-12 DIAGNOSIS — M25561 Pain in right knee: Secondary | ICD-10-CM

## 2019-08-16 ENCOUNTER — Encounter: Payer: Self-pay | Admitting: Orthopaedic Surgery

## 2019-08-16 ENCOUNTER — Other Ambulatory Visit: Payer: Self-pay

## 2019-08-16 ENCOUNTER — Ambulatory Visit (INDEPENDENT_AMBULATORY_CARE_PROVIDER_SITE_OTHER): Payer: Medicare Other | Admitting: Orthopaedic Surgery

## 2019-08-16 DIAGNOSIS — S83241A Other tear of medial meniscus, current injury, right knee, initial encounter: Secondary | ICD-10-CM | POA: Diagnosis not present

## 2019-08-16 NOTE — Progress Notes (Signed)
The patient is a very active and pleasant 78 year old female who is coming in today to go over an MRI of her right knee.  She has been having locking and catching and medial joint line tenderness of that knee.  She also had x-rays that showed a well-maintained knee joint with no significant arthritic findings.  She had tried and failed other conservative treat measures and continued to have knee swelling and pain and locking catching with medial joint tenderness and a positive Murray sign.  She still having the symptoms.  On exam she still has medial joint tenderness of the right knee.  There are some patellofemoral crepitation but that does not bother her.  There is posterior medial pain and a palpable Baker's cyst.  The MRI does show a complex medial meniscal tear.  The medial compartment of her knee does not show any significant cartilage deficits.  There is significant patellofemoral arthritic changes in the lateral compartment appears normal.  The cruciate ligaments and the collateral ligaments are all intact.  Given the meniscal tear of her right knee identified on MRI combined with her clinical exam findings and failed conservative treatment she does wish to proceed with a knee arthroscopy.  I do feel this is reasonable based on her clinical exam and MRI findings.  I had a long thorough discussion about this type of surgery.  We explained the risk and benefits of surgery and what to expect intraoperative and postoperative.  I showed her knee model and went over the MRI with her.  She does wish to consider having the surgery done as an outpatient in January so we will work on getting this set up.  All question concerns were answered and addressed.  We would then see her back in 1 week postoperative.

## 2019-09-28 ENCOUNTER — Other Ambulatory Visit: Payer: Self-pay | Admitting: Orthopaedic Surgery

## 2019-09-28 DIAGNOSIS — S83271D Complex tear of lateral meniscus, current injury, right knee, subsequent encounter: Secondary | ICD-10-CM | POA: Diagnosis not present

## 2019-09-28 DIAGNOSIS — S83231D Complex tear of medial meniscus, current injury, right knee, subsequent encounter: Secondary | ICD-10-CM | POA: Diagnosis not present

## 2019-09-28 MED ORDER — HYDROCODONE-ACETAMINOPHEN 5-325 MG PO TABS: 1.0000 | ORAL_TABLET | Freq: Four times a day (QID) | ORAL | 0 refills | Status: DC | PRN

## 2019-10-04 ENCOUNTER — Other Ambulatory Visit: Payer: Self-pay

## 2019-10-04 ENCOUNTER — Encounter: Payer: Self-pay | Admitting: Orthopaedic Surgery

## 2019-10-04 ENCOUNTER — Ambulatory Visit (INDEPENDENT_AMBULATORY_CARE_PROVIDER_SITE_OTHER): Payer: Medicare Other | Admitting: Orthopaedic Surgery

## 2019-10-04 DIAGNOSIS — Z9889 Other specified postprocedural states: Secondary | ICD-10-CM

## 2019-10-04 NOTE — Progress Notes (Signed)
Patient is a very pleasant 79 year old who is 1 week out from a right knee arthroscopy.  We found a significant medial meniscal tear with a flap component.  We are debride this back to a stable margin with partial medial meniscectomy.  Her lateral meniscus on the right knee had just some degenerative fraying.  We found good-looking cartilage in the medial lateral compartment and a normal ACL.  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  She is doing well overall.  I shared with her arthroscopy pictures.  Also remove the sutures from her knee.  She does have a mild effusion.  I will have her work on straight leg raises and work on her balance and coordination.  She is not interested in physical therapy right now because she feels like she can do this on her own.  All question concerns were answered and addressed.  We will see her back in 4 weeks to see how she is doing clinically.

## 2019-10-12 ENCOUNTER — Inpatient Hospital Stay: Payer: Medicare Other | Admitting: Physician Assistant

## 2019-10-30 DIAGNOSIS — I10 Essential (primary) hypertension: Secondary | ICD-10-CM | POA: Insufficient documentation

## 2019-11-01 ENCOUNTER — Ambulatory Visit: Payer: Medicare Other | Admitting: Orthopaedic Surgery

## 2019-11-06 ENCOUNTER — Ambulatory Visit (INDEPENDENT_AMBULATORY_CARE_PROVIDER_SITE_OTHER): Payer: Medicare Other | Admitting: Orthopaedic Surgery

## 2019-11-06 ENCOUNTER — Other Ambulatory Visit: Payer: Self-pay

## 2019-11-06 ENCOUNTER — Encounter: Payer: Self-pay | Admitting: Orthopaedic Surgery

## 2019-11-06 DIAGNOSIS — Z9889 Other specified postprocedural states: Secondary | ICD-10-CM

## 2019-11-06 DIAGNOSIS — G8929 Other chronic pain: Secondary | ICD-10-CM

## 2019-11-06 DIAGNOSIS — M25562 Pain in left knee: Secondary | ICD-10-CM

## 2019-11-06 NOTE — Progress Notes (Signed)
Ms. Yurko is following up at 5 and half weeks status post a right knee arthroscopy.  She had significant medial meniscal tear with good cartilage in the knee overall.  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  Both knees are sore today.  She feels like the left knee is mainly from compensating due to getting over surgery on the right knee.  At her last visit she did have a moderate knee joint effusion.  On examination of her right knee today, the effusion is gotten significantly less.  She is still having appropriate medial joint line tenderness given the surgery that we performed and posterior knee pain.  Overall though she does have good motion of the knee.  She is definitely having some tenderness in the proximal quad muscles.  At this point she will continue to increase her activities as comfort allows.  We will see her back likely for final visit in 4 weeks.  If she still having inflammatory component of her pain I would recommend a steroid injection even considering this in both knees.  All questions concerns were answered and addressed.

## 2019-12-19 ENCOUNTER — Other Ambulatory Visit: Payer: Self-pay

## 2019-12-19 ENCOUNTER — Ambulatory Visit (INDEPENDENT_AMBULATORY_CARE_PROVIDER_SITE_OTHER): Payer: Medicare Other | Admitting: Orthopaedic Surgery

## 2019-12-19 ENCOUNTER — Encounter: Payer: Self-pay | Admitting: Orthopaedic Surgery

## 2019-12-19 DIAGNOSIS — Z9889 Other specified postprocedural states: Secondary | ICD-10-CM

## 2019-12-19 DIAGNOSIS — M25562 Pain in left knee: Secondary | ICD-10-CM

## 2019-12-19 DIAGNOSIS — G8929 Other chronic pain: Secondary | ICD-10-CM

## 2019-12-19 DIAGNOSIS — M1712 Unilateral primary osteoarthritis, left knee: Secondary | ICD-10-CM | POA: Insufficient documentation

## 2019-12-19 NOTE — Progress Notes (Signed)
Alison Chandler is 11 weeks status post a right knee arthroscopy with a partial medial meniscectomy.  She is a very active 79 year old female.  At the time of surgery we did not find any significant cartilage changes in the right knee.  She states that he has done very well since surgery and her pain is decreased and her range of motion is improved.  Her left knee does have known significant arthritic changes with known osteoarthritis.  It still bothers her more than the right knee.  At this point she does not feel that she needs an injection in either knee today.  We did discuss in detail about the possibility of a hyaluronic acid injection in her left knee in the future if she continues to have problems.  On examination of both knees today the left knee still hurts globally and has a slight effusion with good range of motion.  The right more recent operative knee is moving much better overall and has a little bit of medial joint line tenderness to be expected from the extent of her meniscal tear and surgery.  At this point since she is doing so well she will still work on quad strengthening exercises and activity modification.  Follow-up can be as needed.  However, if she develops any worsening issues with her left knee I would like her to call us so we can order a hyaluronic acid injection for her left knee prior to being seen.

## 2020-01-26 ENCOUNTER — Telehealth: Payer: Self-pay | Admitting: Orthopaedic Surgery

## 2020-01-26 NOTE — Telephone Encounter (Signed)
Made in error

## 2020-02-07 ENCOUNTER — Ambulatory Visit (INDEPENDENT_AMBULATORY_CARE_PROVIDER_SITE_OTHER): Payer: Medicare Other | Admitting: Physician Assistant

## 2020-02-07 ENCOUNTER — Other Ambulatory Visit: Payer: Self-pay

## 2020-02-07 ENCOUNTER — Ambulatory Visit (INDEPENDENT_AMBULATORY_CARE_PROVIDER_SITE_OTHER): Payer: Medicare Other

## 2020-02-07 DIAGNOSIS — M7918 Myalgia, other site: Secondary | ICD-10-CM

## 2020-02-07 MED ORDER — METHYLPREDNISOLONE ACETATE 40 MG/ML IJ SUSP
40.0000 mg | INTRAMUSCULAR | Status: AC | PRN
  Administered 2020-02-07: 40 mg via INTRAMUSCULAR

## 2020-02-07 MED ORDER — LIDOCAINE HCL 1 % IJ SOLN
3.0000 mL | INTRAMUSCULAR | Status: AC | PRN
  Administered 2020-02-07: 3 mL

## 2020-02-07 NOTE — Progress Notes (Signed)
Office Visit Note   Patient: Alison Chandler           Date of Birth: 07-24-1941           MRN: [id number] Visit Date: 02/07/2020              Requested by: Verdell Carmine., MD [address] [place],  [place] [address] PCP: Verdell Carmine., MD   Assessment & Plan: Visit Diagnoses:  1. Acute buttock pain     Plan: We will see how she does with the injection.  If she has no real improvement next 7 to 10 days she will call and we will send her to physical therapy for hamstring stretching IT band stretching.  Did show her some IT band and hamstring stretching exercises that she can do on her own at home.  She develops any radicular symptoms down the leg leg that would be indicative of coming from her back would recommend physical therapy for back possible Medrol Dosepak.  Questions were encouraged and answered at length today.  Follow-Up Instructions: Return if symptoms worsen or fail to improve.   Orders:  Orders Placed This Encounter  Procedures  . Trigger Point Inj  . XR Lumbar Spine 2-3 Views   No orders of the defined types were placed in this encounter.     Procedures: Trigger Point Inj  Date/Time: 02/07/2020 4:38 PM Performed by: Pete Pelt, PA-C Authorized by: Pete Pelt, PA-C   Consent Given by:  Patient Indications:  Pain and therapeutic Total # of Trigger Points:  1 Location: lower extremity   Needle Size:  22 G Approach: left buttock. Medications #1:  3 mL lidocaine 1 %; 40 mg methylPREDNISolone acetate 40 MG/ML     Clinical Data: No additional findings.   Subjective: Chief Complaint  Patient presents with  . Lower Back - Pain    HPI Alison Chandler comes in today with left buttocks pain for about 2 weeks.  No numbness tingling down the leg.  No back pain.  She is known to Dr. Ninfa Linden service with a history of right IT band and trochanteric bursitis.  Also she has had a right total hip arthroplasty which is doing well.  She has tried Aleve  for this with no relief also tried Robaxin which helps som  Pain is worse whenever she is first beginning to stand up or first go and sit down.  She is uncomfortable whenever she sits down and points to her left buttocks region as the source of her pain. Review of Systems She denies any fevers chills.  See HPI  Objective: Vital Signs: There were no vitals taken for this visit.  Physical Exam Constitutional:      Appearance: She is not ill-appearing or diaphoretic.  Pulmonary:     Effort: Pulmonary effort is normal.  Neurological:     Mental Status: She is alert and oriented to person, place, and time.  Psychiatric:        Mood and Affect: Mood normal.     Ortho Exam Hips excellent range of motion without pain.  Lower extremities 5 out of 5 strength throughout against resistance.  Negative straight leg raise bilaterally.  There is has a tight hamstrings bilaterally she comes within 4 inches of being able to touch her toes.  Good extension lumbar spine without pain.  She has no tenderness over the lumbar spinal column or paraspinous region.  She has tenderness in the left buttocks  region.  There is no rashes erythema or ecchymosis over this area. Specialty Comments:  No specialty comments available.  Imaging: XR Lumbar Spine 2-3 Views  Result Date: 02/07/2020 Lumbar spine 2 views: No acute fractures.  No spondylolisthesis.  This space narrowing at L5-S1 L2-L5 mild vertebral body endplate spurring.  Slight scoliosis.  Facet arthritic changes lower lumbar region.    PMFS History: Patient Active Problem List   Diagnosis Date Noted  . Unilateral primary osteoarthritis, left knee 12/19/2019  . Benign essential hypertension 10/30/2019  . Hypercalcemia 10/30/2019  . Acute medial meniscal tear, right, initial encounter 08/16/2019  . Iatrogenic hypocalcemia 06/05/2019  . Primary hyperparathyroidism ([place])   . Nuclear sclerotic cataract of both eyes 10/05/2018  . RPE mottling of macula  10/05/2018  . Flat foot 06/20/2018  . Tibialis posterior tendonitis, left 06/20/2018  . Acute pain of right shoulder 05/04/2018  . Chronic pain of left knee 12/22/2017  . Subjective memory complaints 10/08/2017  . It band syndrome, left 08/11/2017  . 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 Meningioma ([place]) 03/23/2017  . Idiopathic peripheral neuropathy 03/11/2017  . Imbalance 03/11/2017  . Paresthesias 12/06/2016  . Tremor 12/06/2016  . Age-related osteoporosis without current pathological fracture 12/03/2016  . Nephrolithiasis 12/03/2016  . 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/21/2016  . 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 Hypertropia of left eye 09/05/2014  . Intermittent exotropia of left eye 09/05/2014  . Midline cystocele 05/10/2014  . Stress bladder incontinence, female 05/10/2014  . Benign neoplasm of colon 04/16/2014  . Calculus of ureter 04/16/2014  . 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 Esophageal reflux 04/16/2014  . Gross hematuria 04/16/2014  . Headache 04/16/2014  . 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 Increased urinary frequency 04/16/2014  . 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 Osteopenia, 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  . Other seborrheic keratosis 04/16/2014  . Other synovitis and tenosynovitis 04/16/2014  . Presbycusis 04/16/2014  . Primary localized osteoarthrosis, pelvic region and thigh 04/16/2014  . 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 Strabismus 04/16/2014  . 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 Urinary tract infection 04/16/2014  . Allergic rhinitis 04/16/2014  . Degenerative arthritis of hip 02/19/2012   Past Medical History:  Diagnosis Date  . Arthritis   . Hearing loss   . History of kidney stones   . Hyperlipidemia   . Hypertension     Family History  Problem Relation Age of Onset  . Thyroid disease Mother   . Lung cancer Mother 48  . Breast cancer Mother 63  . Parkinson's disease Father     Past Surgical History:    Procedure Laterality Date  . colonscopy  01-27-2012  . JOINT REPLACEMENT Right    THR  . KIDNEY STONE SURGERY    . PARATHYROIDECTOMY N/A 05/10/2019   Procedure: PARATHYROIDECTOMY;  Surgeon: Fredirick Maudlin, MD;  Location: [facility] ORS;  Service: General;  Laterality: N/A;  . TOTAL HIP ARTHROPLASTY  02/19/2012   Procedure: TOTAL HIP ARTHROPLASTY ANTERIOR APPROACH;  Surgeon: Mcarthur Rossetti, MD;  Location: [facility];  Service: Orthopedics;  Laterality: Right;   Social History   Occupational History  . Not on file  Tobacco Use  . Smoking status: Former Smoker    Packs/day: 0.25    Years: 20.00    Pack years: 5.00    Quit date: 09/01/1971    Years since quitting: 48.4  . Smokeless tobacco: Never Used  Substance and Sexual Activity  . Alcohol use: Yes    Alcohol/week:  1.0 standard drinks    Types: 1 Cans of beer per week  . Drug use: No  . Sexual activity: Not on file

## 2020-05-02 ENCOUNTER — Other Ambulatory Visit: Payer: Self-pay

## 2020-05-02 ENCOUNTER — Ambulatory Visit (INDEPENDENT_AMBULATORY_CARE_PROVIDER_SITE_OTHER): Payer: Medicare Other | Admitting: Orthopaedic Surgery

## 2020-05-02 ENCOUNTER — Encounter: Payer: Self-pay | Admitting: Orthopaedic Surgery

## 2020-05-02 DIAGNOSIS — M7072 Other bursitis of hip, left hip: Secondary | ICD-10-CM

## 2020-05-02 DIAGNOSIS — M25551 Pain in right hip: Secondary | ICD-10-CM | POA: Diagnosis not present

## 2020-05-02 NOTE — Progress Notes (Signed)
The patient is well-known to me.  She has a history of right hip replacement and a right knee arthroscopy.  She is still been dealing with left ischial pain and has now developed some pain around the right hip.  We have seen her at regular intervals and has done a steroid taper recently.  She has had some injections but nothing is really helped.  She has not had any type of injection around her right hip ischium.  She denies any current change in medical status.  She denies any headache, chest pain, shortness of breath, fever, chills, nausea, vomiting  She is alert and orient x3 and in no acute distress Examination of her left hip shows the move smoothly and normally.  She does not have true sciatic pain her pain is to direct palpation and compression around the left ischium.  This does go into a little bit of the proximal hamstring area.  Her right total hip moves smoothly and fluidly with some pain around the trochanteric area.  Her previous right knee arthroscopy area shows just some fullness in the back of the knee but good motion of the knee.  At this point I do feel that she would benefit from a combination of formal physical therapy to work on right ischial bursitis and hamstring tendinitis as well as her left hip trochanteric bursitis with any modalities including dry needling.  I would also like to send her to Dr. Ernestina Patches for a fluoroscopic guided injection around the ischium to see if this would help on the left side.  All question concerns were answered addressed.  We will work on getting this set up and see her back in follow-up.

## 2020-05-08 ENCOUNTER — Other Ambulatory Visit: Payer: Self-pay

## 2020-05-08 DIAGNOSIS — M25551 Pain in right hip: Secondary | ICD-10-CM

## 2020-05-13 ENCOUNTER — Telehealth: Payer: Self-pay | Admitting: Physical Medicine and Rehabilitation

## 2020-05-13 NOTE — Telephone Encounter (Signed)
Called pt and sch for 05/27/20.

## 2020-05-13 NOTE — Telephone Encounter (Signed)
Returned patient's call and rescheduled appointment.

## 2020-05-13 NOTE — Telephone Encounter (Signed)
Pt called just wanting to make sure her referral was coming through ok and make sure there was nothing she needed to do on her end.

## 2020-05-13 NOTE — Telephone Encounter (Signed)
Patient returned call asked for a call back concerning her appointment. The number to contact patient is  [phone]

## 2020-05-21 ENCOUNTER — Other Ambulatory Visit: Payer: Self-pay

## 2020-05-21 ENCOUNTER — Encounter: Payer: Self-pay | Admitting: Physical Medicine and Rehabilitation

## 2020-05-21 ENCOUNTER — Ambulatory Visit (INDEPENDENT_AMBULATORY_CARE_PROVIDER_SITE_OTHER): Payer: Medicare Other | Admitting: Physical Medicine and Rehabilitation

## 2020-05-21 ENCOUNTER — Ambulatory Visit: Payer: Self-pay

## 2020-05-21 DIAGNOSIS — M7072 Other bursitis of hip, left hip: Secondary | ICD-10-CM | POA: Diagnosis not present

## 2020-05-21 DIAGNOSIS — M25552 Pain in left hip: Secondary | ICD-10-CM

## 2020-05-21 NOTE — Progress Notes (Signed)
Pt state under her left buttock she feels pain. Pt state when she trying to get up out of a chair makes the pain uncomfortable. Pt state taking pain pill helps ease the pain.   Numeric Pain Rating Scale and Functional Assessment Average Pain 2   In the last MONTH (on 0-10 scale) has pain interfered with the following?  1. General activity like being  able to carry out your everyday physical activities such as walking, climbing stairs, carrying groceries, or moving a chair?  Rating(5)

## 2020-05-21 NOTE — Progress Notes (Signed)
   Alison Chandler - 79 y.o. female MRN [id number]  Date of birth: 22-Apr-1941  Office Visit Note: Visit Date: 05/21/2020 PCP: Verdell Carmine., MD Referred by: Verdell Carmine., MD  Subjective: Chief Complaint  Patient presents with  . Left Hip - Pain   HPI:  Alison Chandler is a 79 y.o. female who comes in today at the request of Dr. Jean Rosenthal for planned Left ischial bursa injection with fluoroscopic guidance.  The patient has failed conservative care including home exercise, medications, time and activity modification.  This injection will be diagnostic and hopefully therapeutic.  Please see requesting physician notes for further details and justification.  The patient is status post right total hip replacement.  Interestingly, Dr. Trevor Mace referral suggested right ischial bursa injection and he does talk about some of this in the note but he also talks about her left buttock pain as well.  Patient states that her pain really is under the left buttock and not the right and appears to be located at the proximal insertion of the hamstring on the ischium.  She has pain when trying to get up out of the chair and with sitting for any length of time.  ROS Otherwise per HPI.  Assessment & Plan: Visit Diagnoses:  1. Pain in left hip   2. Ischial bursitis of left side     Plan: No additional findings.   Meds & Orders: No orders of the defined types were placed in this encounter.   Orders Placed This Encounter  Procedures  . Large Joint Inj  . XR C-ARM NO REPORT    Follow-up: No follow-ups on file.   Procedures: Large Joint Inj on 05/21/2020 2:35 PM Indications: diagnostic evaluation and pain Details: 22 G 3.5 in needle, fluoroscopy-guided posterior (Left Ischial bursa) approach  Arthrogram: No  Medications: 4 mL bupivacaine 0.25 %; 60 mg triamcinolone acetonide 40 MG/ML Outcome: tolerated well, no immediate complications  There was excellent flow of contrast producing a  partial bursagram. Procedure, treatment alternatives, risks and benefits explained, specific risks discussed. Consent was given by the patient. Immediately prior to procedure a time out was called to verify the correct patient, procedure, equipment, support staff and site/side marked as required. Patient was prepped and draped in the usual sterile fashion.      No notes on file   Clinical History: No specialty comments available.     Objective:  VS:  HT:    WT:   BMI:     BP:   HR: bpm  TEMP: ( )  RESP:  Physical Exam   Imaging: No results found.

## 2020-05-27 ENCOUNTER — Ambulatory Visit: Payer: Medicare Other | Admitting: Physical Medicine and Rehabilitation

## 2020-05-27 MED ORDER — BUPIVACAINE HCL 0.25 % IJ SOLN
4.0000 mL | INTRAMUSCULAR | Status: AC | PRN
  Administered 2020-05-21: 4 mL via INTRA_ARTICULAR

## 2020-05-27 MED ORDER — TRIAMCINOLONE ACETONIDE 40 MG/ML IJ SUSP
60.0000 mg | INTRAMUSCULAR | Status: AC | PRN
  Administered 2020-05-21: 60 mg via INTRA_ARTICULAR

## 2020-06-13 ENCOUNTER — Ambulatory Visit: Payer: Medicare Other | Admitting: Orthopaedic Surgery

## 2020-06-20 ENCOUNTER — Encounter: Payer: Self-pay | Admitting: Orthopaedic Surgery

## 2020-06-20 ENCOUNTER — Telehealth: Payer: Self-pay

## 2020-06-20 ENCOUNTER — Ambulatory Visit (INDEPENDENT_AMBULATORY_CARE_PROVIDER_SITE_OTHER): Payer: Medicare Other | Admitting: Orthopaedic Surgery

## 2020-06-20 DIAGNOSIS — M1712 Unilateral primary osteoarthritis, left knee: Secondary | ICD-10-CM

## 2020-06-20 DIAGNOSIS — M7072 Other bursitis of hip, left hip: Secondary | ICD-10-CM | POA: Diagnosis not present

## 2020-06-20 NOTE — Progress Notes (Signed)
Patient is following up after having a targeted injection under fluoroscopy in her left ischium by Dr. Ernestina Patches.  She says this is been about 50% successful in terms of reducing her pain and overall she feels like she is doing quite well.  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  She would like to now be considered for a hyaluronic acid injection in that left knee.  Otherwise she feels like she is doing well.  On examination there is still some pain at the left ischium but overall she has strong hamstrings.  She also has strong quad muscles.  This is on the left side.  She does have lateral joint line tenderness on the left knee as well.  We will order hyaluronic acid now for the left knee to treat the pain from osteoarthritis.  We will be in touch when that injection is approved and here and will get her back in.  We can always order another injection in her left ischium but we should hold for now until this gives a better chance to continue to heal.  All questions and concerns were answered and addressed.

## 2020-06-20 NOTE — Telephone Encounter (Signed)
Can we get patient approved for left knee gel injection? Thanks

## 2020-06-21 NOTE — Telephone Encounter (Signed)
Noted  

## 2020-07-02 ENCOUNTER — Telehealth: Payer: Self-pay

## 2020-07-02 NOTE — Telephone Encounter (Signed)
Submitted VOB, Monovisc, left knee.

## 2020-08-02 ENCOUNTER — Telehealth: Payer: Self-pay

## 2020-08-02 NOTE — Telephone Encounter (Signed)
Approved for Monovisc-Left knee Dr. Margarito Liner and [facility] No copay 20% OOP No prior auth required  [facility] to schedule @ next available

## 2020-08-05 NOTE — Telephone Encounter (Signed)
LVM for pt to call back to schedule.

## 2020-08-12 ENCOUNTER — Ambulatory Visit: Payer: Medicare Other | Admitting: Physician Assistant

## 2020-08-14 ENCOUNTER — Ambulatory Visit (INDEPENDENT_AMBULATORY_CARE_PROVIDER_SITE_OTHER): Payer: Medicare Other | Admitting: Orthopaedic Surgery

## 2020-08-14 ENCOUNTER — Encounter: Payer: Self-pay | Admitting: Orthopaedic Surgery

## 2020-08-14 DIAGNOSIS — M1712 Unilateral primary osteoarthritis, left knee: Secondary | ICD-10-CM

## 2020-08-14 MED ORDER — HYALURONAN 88 MG/4ML IX SOSY
88.0000 mg | PREFILLED_SYRINGE | INTRA_ARTICULAR | Status: AC | PRN
  Administered 2020-08-14: 14:00:00 88 mg via INTRA_ARTICULAR

## 2020-08-14 NOTE — Progress Notes (Signed)
   Procedure Note  Patient: Alison Chandler             Date of Birth: 15-Apr-1941           MRN: [id number]             Visit Date: 08/14/2020  Procedures: Visit Diagnoses:  1. Unilateral primary osteoarthritis, left knee     Large Joint Inj: L knee on 08/14/2020 2:20 PM Indications: diagnostic evaluation and pain Details: 22 G 1.5 in needle, superolateral approach  Arthrogram: No  Medications: 88 mg Hyaluronan 88 MG/4ML Outcome: tolerated well, no immediate complications Procedure, treatment alternatives, risks and benefits explained, specific risks discussed. Consent was given by the patient. Immediately prior to procedure a time out was called to verify the correct patient, procedure, equipment, support staff and site/side marked as required. Patient was prepped and draped in the usual sterile fashion.    The patient comes in today for a scheduled hyaluronic acid injection of the left knee with Monovisc to treat the pain from osteoarthritis.  She has tried multiple forms of conservative treatment.  The steroid injections not really helped her at all.  Examination of her left knee does show valgus malalignment of that knee and pain throughout her arc of motion.  We have actually performed arthroscopic intervention of the right knee and the right knee started to bother her.  The right knee does not have an effusion today.  The left knee is much more tight to her.  I did explain the rationale behind steroid injections and hyaluronic acid injections.  She tolerated Monovisc in her left knee today well.  I would like to see her back in 3 months to see how she is doing overall.  At that visit I like a standing AP and lateral of both knees.

## 2020-11-06 ENCOUNTER — Ambulatory Visit: Payer: Self-pay

## 2020-11-06 ENCOUNTER — Ambulatory Visit (INDEPENDENT_AMBULATORY_CARE_PROVIDER_SITE_OTHER): Payer: Medicare Other

## 2020-11-06 ENCOUNTER — Ambulatory Visit (INDEPENDENT_AMBULATORY_CARE_PROVIDER_SITE_OTHER): Payer: Medicare Other | Admitting: Orthopaedic Surgery

## 2020-11-06 DIAGNOSIS — M1711 Unilateral primary osteoarthritis, right knee: Secondary | ICD-10-CM | POA: Diagnosis not present

## 2020-11-06 DIAGNOSIS — M1712 Unilateral primary osteoarthritis, left knee: Secondary | ICD-10-CM

## 2020-11-06 DIAGNOSIS — Z9889 Other specified postprocedural states: Secondary | ICD-10-CM | POA: Diagnosis not present

## 2020-11-06 NOTE — Progress Notes (Signed)
The patient is very well-known to me.  She is 80 years old and quite active.  I have been seeing her for some time for her left and right knees.  Her right knee we did perform arthroscopic surgery on not too long ago.  That knee is doing well.  The left knee recently had a hyaluronic acid injection placed in the and has done well.  The left knee is the one that hurts her more.  We did obtain baseline x-rays of her knees today because is been sometime.  She walks without assistive device.  She is doing well overall.  Examination of both knees shows just some slight valgus malalignment on the left knee comparing left and right knee and slight effusion of the left knee comparing both knees.  Both knees do have patellofemoral crepitation.  The left knee does have lateral joint line tenderness as well.  X-rays of both knees were reviewed.  The right knee has moderate arthritis in the left knee has more severe arthritis involving mainly the lateral compartment and patellofemoral joint.  There are large osteophytes in the knee on the left side they are not present on the right side.  Most of the disease on the right side is the patellofemoral joint.  At this point she will continue to increase activities as comfort allows.  She knows that she can get hyaluronic acid injections about 6 months apart if needed.  All question concerns were answered and addressed.  Follow-up is as needed.

## 2020-11-18 IMAGING — CR CHEST - 2 VIEW
2 series · 2 of 2 positions shown · non-contrast
Comparison: None.

CLINICAL DATA: Fall pain under left breast

EXAM:
CHEST - 2 VIEW

[w chest pa]
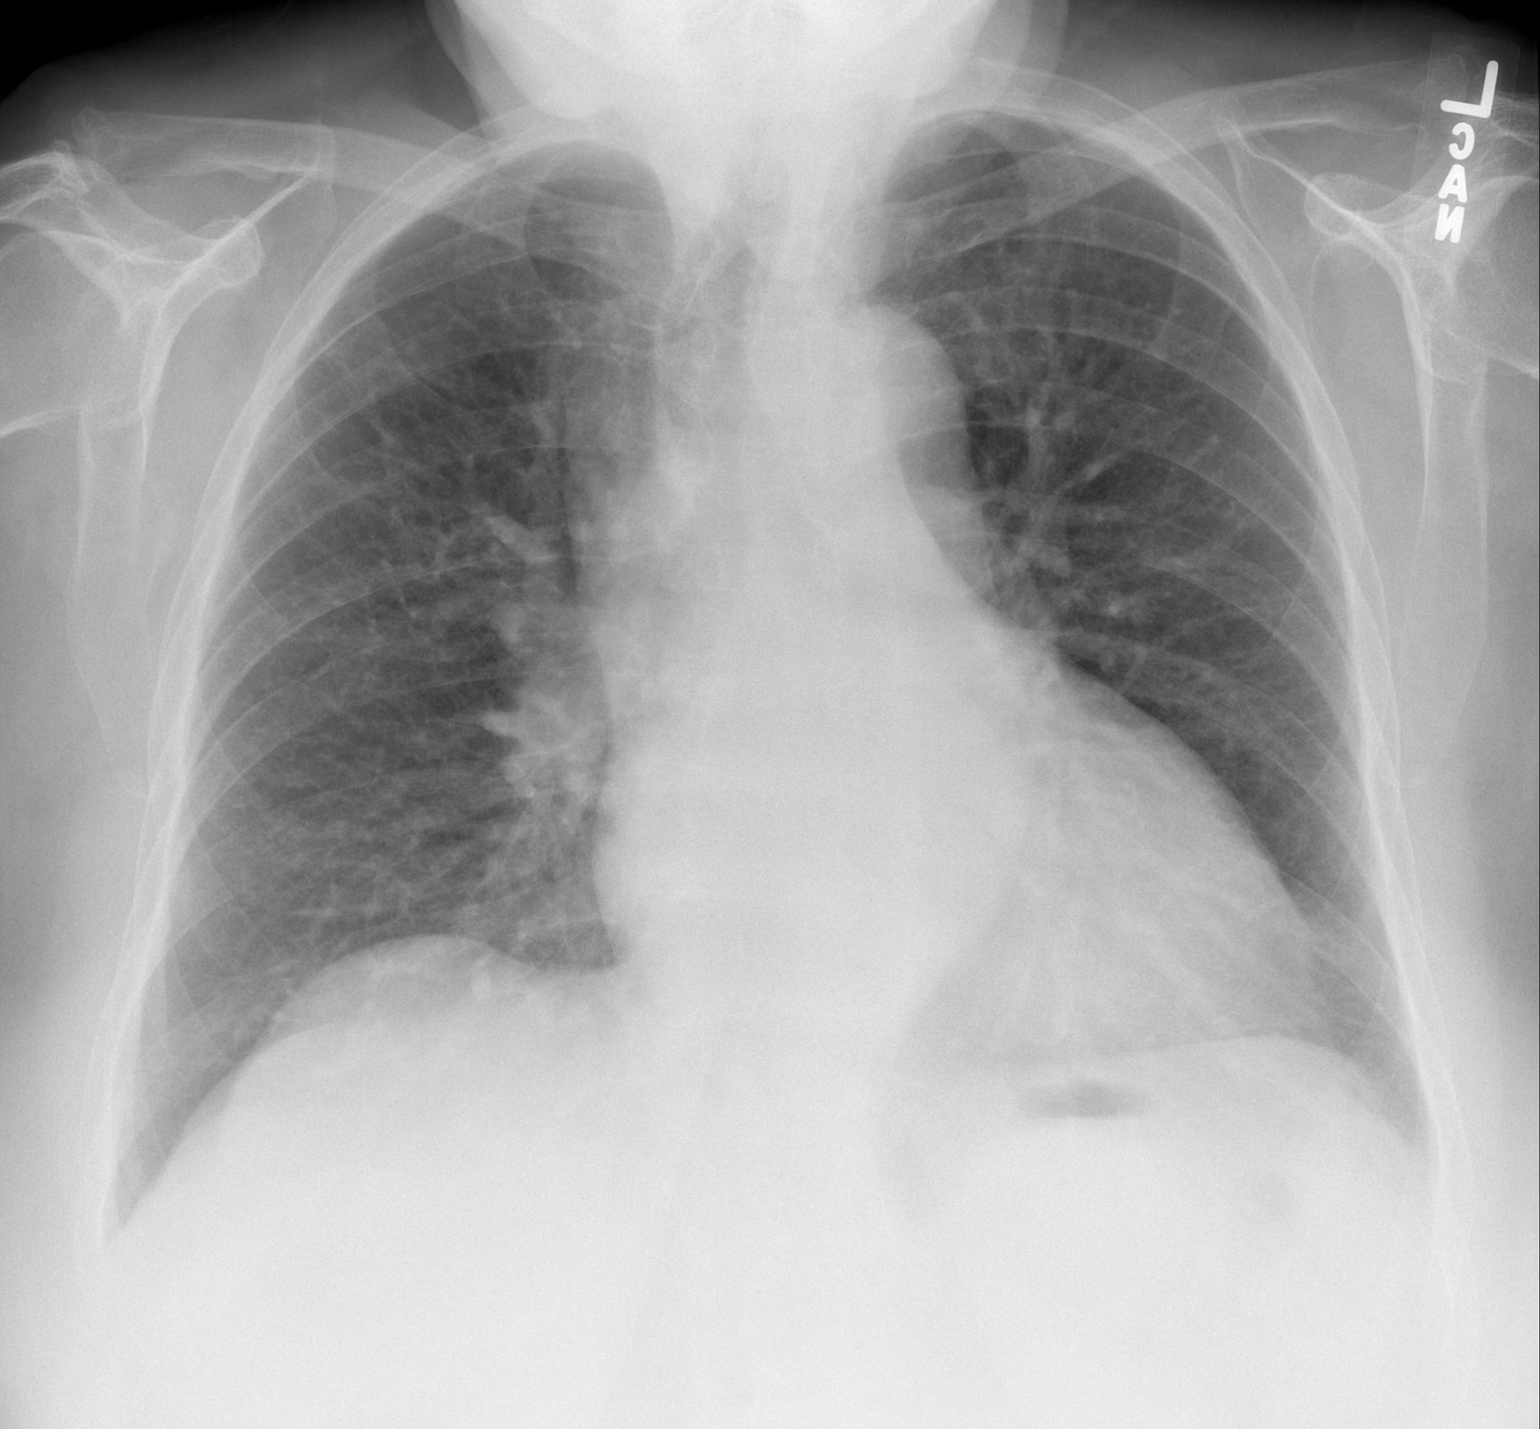

[w chest lat]
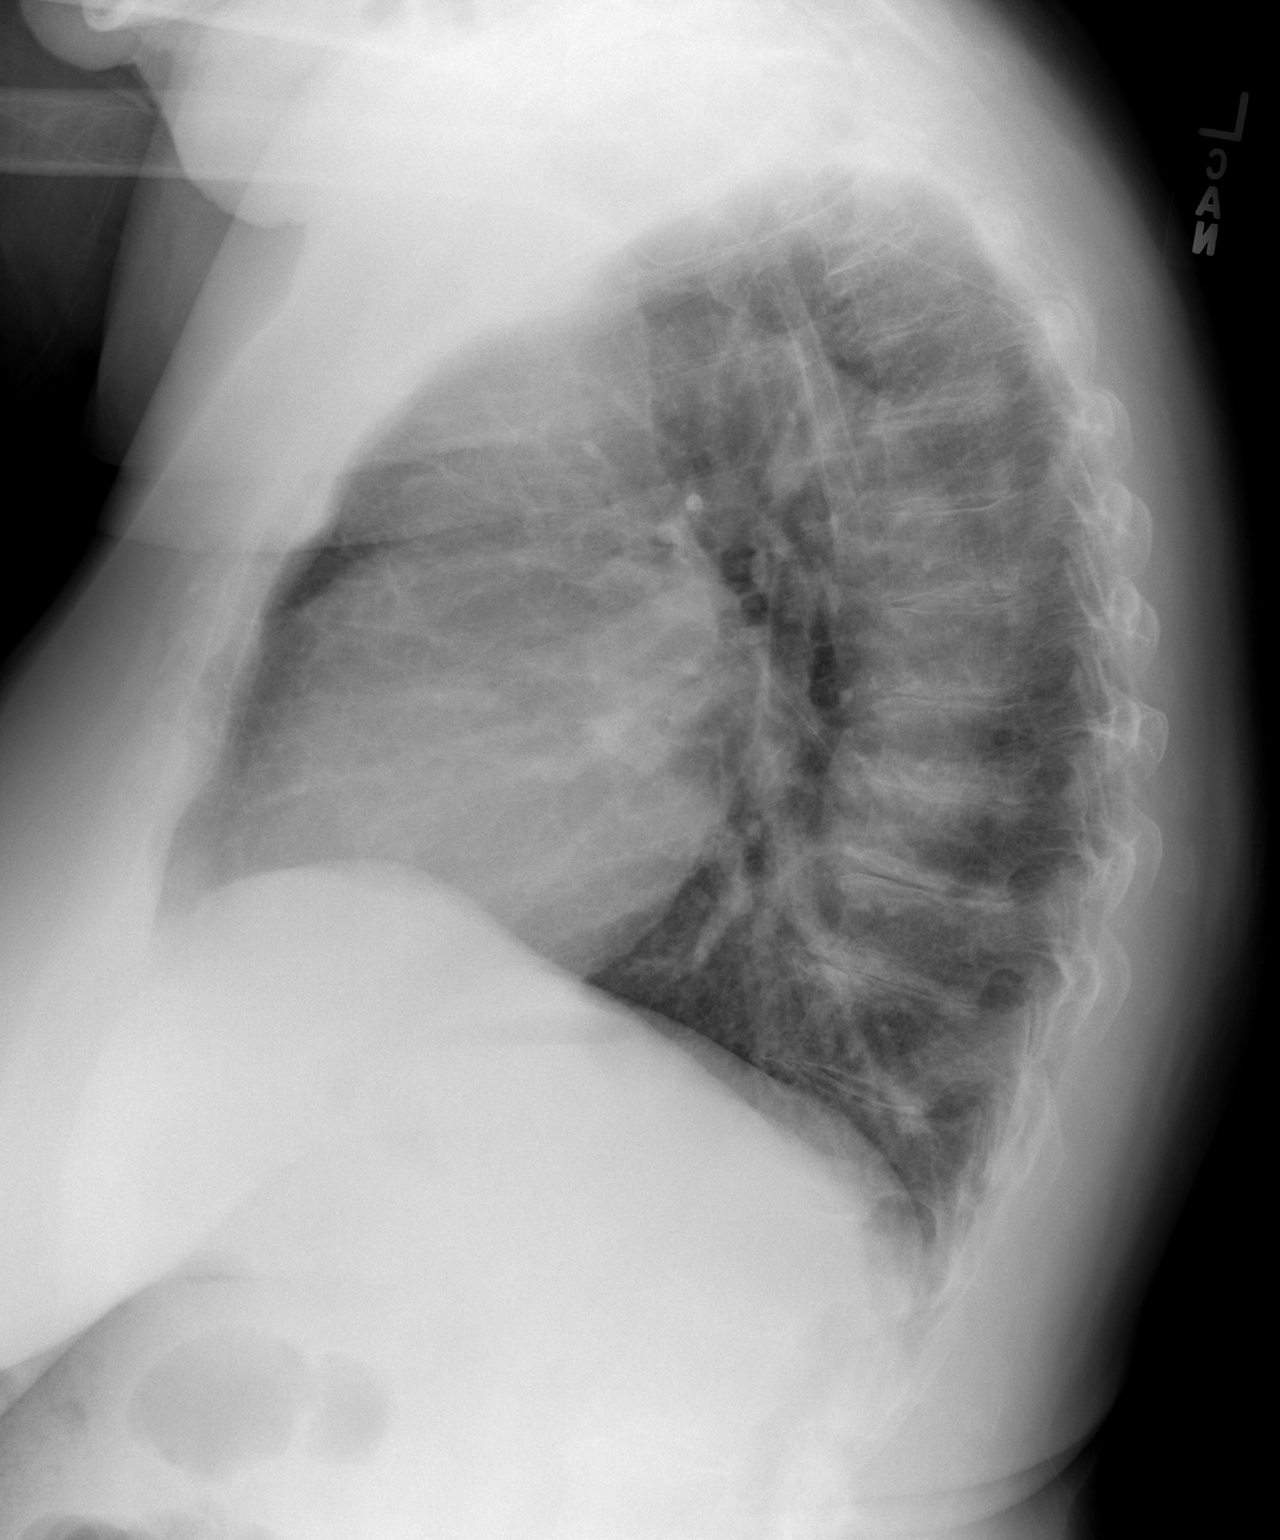

[2 of 2 positions shown; findings below may reference images not displayed]

FINDINGS: No focal opacity or pleural effusion. Borderline cardiomegaly. No
pneumothorax. Degenerative changes of the spine.
IMPRESSION: No active cardiopulmonary disease.  Borderline cardiomegaly.

## 2021-01-07 ENCOUNTER — Ambulatory Visit: Payer: Medicare Other | Admitting: Orthopaedic Surgery

## 2021-01-15 ENCOUNTER — Ambulatory Visit: Payer: Medicare Other | Admitting: Orthopaedic Surgery

## 2021-02-05 ENCOUNTER — Encounter: Payer: Self-pay | Admitting: Orthopaedic Surgery

## 2021-02-05 ENCOUNTER — Ambulatory Visit (INDEPENDENT_AMBULATORY_CARE_PROVIDER_SITE_OTHER): Payer: Medicare Other | Admitting: Orthopaedic Surgery

## 2021-02-05 VITALS — Ht 68.5 in | Wt 232.2 lb

## 2021-02-05 DIAGNOSIS — M1712 Unilateral primary osteoarthritis, left knee: Secondary | ICD-10-CM | POA: Diagnosis not present

## 2021-02-05 NOTE — Progress Notes (Signed)
Office Visit Note   Patient: Alison Chandler           Date of Birth: December 01, 1940           MRN: [id number] Visit Date: 02/05/2021              Requested by: Verdell Carmine., MD [address] [place],  [place] [address] PCP: Verdell Carmine., MD   Assessment & Plan: Visit Diagnoses:  1. Unilateral primary osteoarthritis, left knee     Plan: I agree with the need for a knee replacement of the left knee at this standpoint given the failed conservative treatment and her x-ray findings and clinical exam findings.  I showed her knee model and explained in detail what the surgery involves.  We talked about the risk and benefits of surgery.  I described what to expect and interoperative postoperative course.  All questions and concerns were answered and addressed.  We will work on getting this surgery scheduled.  Follow-Up Instructions: Return for 2 weeks post-op.   Orders:  No orders of the defined types were placed in this encounter.  No orders of the defined types were placed in this encounter.     Procedures: No procedures performed   Clinical Data: No additional findings.   Subjective: No chief complaint on file. The patient is well-known to me.  She has a chief complaint of significant left knee pain.  We have known this from a long period of time.  She has known and well-documented end-stage arthritis of the left knee.  She is 80 years old and very active.  She has tried and failed all forms conservative treatment for the left knee including activity modification, quad strengthening exercises, anti-inflammatories, steroid injections and hyaluronic acid injections.  At this point her left knee pain is detriment affecting her mobility, her quality of life and actives daily living to the point she does wish proceed with a knee replacement.  She has tried and failed conservative treatment for well over a year for this left knee.  She has had no other acute change in her medical  status is otherwise healthy 80 year old female well-known to me.  I replaced a hip before and if performed arthroscopic surgery on her other knee before.  HPI  Review of Systems There is currently listed no headache, chest pain, shortness of breath, fever, chills, nausea, vomiting  Objective: Vital Signs: Ht 5' 8.5" (1.74 m)   Wt 232 lb 3.2 oz (105.3 kg)   BMI 34.79 kg/m   Physical Exam She is alert and orient x3 and in no acute distress Ortho Exam Examination of her left knee shows some valgus malalignment.  There is significant lateral joint line tenderness and patellofemoral Rotation.  The knee is ligamentously stable and has good range of motion but is very painful. Specialty Comments:  No specialty comments available.  Imaging: No results found. Previous x-rays of the left knee including AP and lateral view shows tricompartment arthritis with valgus malalignment and significant arthritis in the lateral compartment and patellofemoral joint.  PMFS History: Patient Active Problem List   Diagnosis Date Noted  . Unilateral primary osteoarthritis, left knee 12/19/2019  . Benign essential hypertension 10/30/2019  . Hypercalcemia 10/30/2019  . Acute medial meniscal tear, right, initial encounter 08/16/2019  . Iatrogenic hypocalcemia 06/05/2019  . Primary hyperparathyroidism ([place])   . Nuclear sclerotic cataract of both eyes 10/05/2018  . RPE mottling of macula 10/05/2018  . Flat foot 06/20/2018  .  Tibialis posterior tendonitis, left 06/20/2018  . Acute pain of right shoulder 05/04/2018  . Chronic pain of left knee 12/22/2017  . Subjective memory complaints 10/08/2017  . It band syndrome, left 08/11/2017  . 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 Meningioma ([place]) 03/23/2017  . Idiopathic peripheral neuropathy 03/11/2017  . Imbalance 03/11/2017  . Paresthesias 12/06/2016  . Tremor 12/06/2016  . Age-related osteoporosis without current pathological fracture 12/03/2016  .  Nephrolithiasis 12/03/2016  . 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/21/2016  . 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 Hypertropia of left eye 09/05/2014  . Intermittent exotropia of left eye 09/05/2014  . Midline cystocele 05/10/2014  . Stress bladder incontinence, female 05/10/2014  . Benign neoplasm of colon 04/16/2014  . Calculus of ureter 04/16/2014  . 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 Esophageal reflux 04/16/2014  . Gross hematuria 04/16/2014  . Headache 04/16/2014  . 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 Increased urinary frequency 04/16/2014  . 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 Osteopenia, 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  . Other seborrheic keratosis 04/16/2014  . Other synovitis and tenosynovitis 04/16/2014  . Presbycusis 04/16/2014  . Primary localized osteoarthrosis, pelvic region and thigh 04/16/2014  . 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 Strabismus 04/16/2014  . 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 Urinary tract infection 04/16/2014  . Allergic rhinitis 04/16/2014  . Degenerative arthritis of hip 02/19/2012   Past Medical History:  Diagnosis Date  . Arthritis   . Hearing loss   . History of kidney stones   . Hyperlipidemia   . Hypertension     Family History  Problem Relation Age of Onset  . Thyroid disease Mother   . Lung cancer Mother 33  . Breast cancer Mother 40  . Parkinson's disease Father     Past Surgical History:  Procedure Laterality Date  . colonscopy  01-27-2012  . JOINT REPLACEMENT Right    THR  . KIDNEY STONE SURGERY    . PARATHYROIDECTOMY N/A 05/10/2019   Procedure: PARATHYROIDECTOMY;  Surgeon: Fredirick Maudlin, MD;  Location: [facility] ORS;  Service: General;  Laterality: N/A;  . TOTAL HIP ARTHROPLASTY  02/19/2012   Procedure: TOTAL HIP ARTHROPLASTY ANTERIOR APPROACH;  Surgeon: Mcarthur Rossetti, MD;  Location: [facility] ORS;  Service: Orthopedics;  Laterality: Right;   Social History   Occupational History  . Not on file   Tobacco Use  . Smoking status: Former Smoker    Packs/day: 0.25    Years: 20.00    Pack years: 5.00    Quit date: 09/01/1971    Years since quitting: 49.4  . Smokeless tobacco: Never Used  Vaping Use  . Vaping Use: Never used  Substance and Sexual Activity  . Alcohol use: Yes    Alcohol/week: 1.0 standard drink    Types: 1 Cans of beer per week  . Drug use: No  . Sexual activity: Not on file

## 2021-02-27 ENCOUNTER — Telehealth: Payer: Self-pay

## 2021-02-27 DIAGNOSIS — M1712 Unilateral primary osteoarthritis, left knee: Secondary | ICD-10-CM

## 2021-02-27 NOTE — Telephone Encounter (Signed)
I called patient and we are planning TKA for 05/23/21.  She questioned doing P.T. prior to surgery to help with strengthening.  Please call her to let her know if this would help and can be done.

## 2021-02-28 NOTE — Telephone Encounter (Signed)
Please advise 

## 2021-03-04 NOTE — Telephone Encounter (Signed)
I talked to the pt. She stated understanding. Pt would like to go to one visit to get HEP to work on at home until sx. I have placed the order in, Pt would like to go to [facility] in [place]....    [facility] 3.7 6 Google reviews Rehabilitation center in [place], [place] COVID-19 info: [email or web address] Get online care: [email or web address] Located in: [facility] Address: [address] [place], [place] [address] Hours:  Open ? Closes 6PM Confirmed by this business 3 weeks ago Phone: [phone]

## 2021-03-04 NOTE — Addendum Note (Signed)
Addended by: Robyne Peers on: 03/04/2021 09:21 AM   Modules accepted: Orders

## 2021-03-05 NOTE — Telephone Encounter (Signed)
Faxed referral to [phone], p [phone] [facility] [address], [place] [address]

## 2021-05-08 ENCOUNTER — Other Ambulatory Visit: Payer: Self-pay | Admitting: Physician Assistant

## 2021-05-08 NOTE — Progress Notes (Addendum)
Sent message, via epic in basket, requesting orders in epic from surgeon.  

## 2021-05-09 NOTE — Patient Instructions (Addendum)
DUE TO COVID-19 ONLY ONE VISITOR IS ALLOWED TO COME WITH YOU AND STAY IN THE WAITING ROOM ONLY DURING PRE OP AND PROCEDURE DAY OF SURGERY.   TWO VISITOR  MAY VISIT WITH YOU AFTER SURGERY IN YOUR PRIVATE ROOM DURING VISITING HOURS ONLY!  YOU NEED TO HAVE A COVID 19 TEST ON__9-21-22_____ between 8am-3pm_______, THIS TEST MUST BE DONE BEFORE SURGERY,     Please bring completed form with you to the COVID testing site   COVID TESTING SITE [place] TEST IS COMPLETED,  PLEASE Wear a mask when in public           Your procedure is scheduled on: 05-23-21   Report to [facility] Main  Entrance   Report to admitting at       [place]  AM     Call this number if you have problems the morning of surgery [phone]   Remember: NO SOLID FOOD AFTER MIDNIGHT THE NIGHT PRIOR TO SURGERY.   NOTHING BY MOUTH EXCEPT CLEAR LIQUIDS UNTIL    0800 am  .   PLEASE FINISH ENSURE DRINK PER SURGEON ORDER  WHICH NEEDS TO BE COMPLETED AT         0800 am then nothing by mouth.      CLEAR LIQUID DIET                                                                    water Black Coffee and tea, regular and decaf                             Plain Jell-O any favor except red or purple                                  Fruit ices (not with fruit pulp)                                      Iced Popsicles                                     Carbonated beverages, regular and diet                                    Cranberry, grape and apple juices Sports drinks like Gatorade Lightly seasoned clear broth or consume(fat free) Sugar, honey syrup  _____________________________________________________________________     BRUSH YOUR TEETH MORNING OF SURGERY AND RINSE YOUR MOUTH OUT, NO CHEWING GUM CANDY OR MINTS.     Take these medicines the morning of surgery with A SIP OF WATER: NONE                                You may not have any metal on  your  body including hair pins and              piercings  Do not wear jewelry, make-up, lotions, powders,perfumes,      deodorant             Do not wear nail polish on your fingernails or toenails .  Do not shave  48 hours prior to surgery.                Do not bring valuables to the hospital. [place].  Contacts, dentures or bridgework may not be worn into surgery.       Patients discharged the day of surgery will not be allowed to drive home. IF YOU ARE HAVING SURGERY AND GOING HOME THE SAME DAY, YOU MUST HAVE AN ADULT TO DRIVE YOU HOME AND BE WITH YOU FOR 24 HOURS. YOU MAY GO HOME BY TAXI OR UBER OR ORTHERWISE, BUT AN ADULT MUST ACCOMPANY YOU HOME AND STAY WITH YOU FOR 24 HOURS.  Name and phone number of your driver:  Special Instructions: N/A              Please read over the following fact sheets you were given: _____________________________________________________________________             [facility] - Preparing for Surgery Before surgery, you can play an important role.  Because skin is not sterile, your skin needs to be as free of germs as possible.  You can reduce the number of germs on your skin by washing with CHG (chlorahexidine gluconate) soap before surgery.  CHG is an antiseptic cleaner which kills germs and bonds with the skin to continue killing germs even after washing. Please DO NOT use if you have an allergy to CHG or antibacterial soaps.  If your skin becomes reddened/irritated stop using the CHG and inform your nurse when you arrive at Short Stay. Do not shave (including legs and underarms) for at least 48 hours prior to the first CHG shower.  You may shave your face/neck. Please follow these instructions carefully:  1.  Shower with CHG Soap the night before surgery and the  morning of Surgery.  2.  If you choose to wash your hair, wash your hair first as usual with your  normal  shampoo.  3.  After you shampoo,  rinse your hair and body thoroughly to remove the  shampoo.                                        4.  Use CHG as you would any other liquid soap.  You can apply chg directly  to the skin and wash                       Gently with a scrungie or clean washcloth.  5.  Apply the CHG Soap to your body ONLY FROM THE NECK DOWN.   Do not use on face/ open                           Wound or open sores. Avoid contact with eyes, ears mouth and genitals (private parts).  Wash face,  Genitals (private parts) with your normal soap.             6.  Wash thoroughly, paying special attention to the area where your surgery  will be performed.  7.  Thoroughly rinse your body with warm water from the neck down.  8.  DO NOT shower/wash with your normal soap after using and rinsing off  the CHG Soap.                9.  Pat yourself dry with a clean towel.            10.  Wear clean pajamas.            11.  Place clean sheets on your bed the night of your first shower and do not  sleep with pets. Day of Surgery : Do not apply any lotions/deodorants the morning of surgery.  Please wear clean clothes to the hospital/surgery center.  FAILURE TO FOLLOW THESE INSTRUCTIONS MAY RESULT IN THE CANCELLATION OF YOUR SURGERY PATIENT SIGNATURE_________________________________  NURSE SIGNATURE__________________________________  ________________________________________________________________________    Alison Chandler  An incentive spirometer is a tool that can help keep your lungs clear and active. This tool measures how well you are filling your lungs with each breath. Taking long deep breaths may help reverse or decrease the chance of developing breathing (pulmonary) problems (especially infection) following: A long period of time when you are unable to move or be active. BEFORE THE PROCEDURE  If the spirometer includes an indicator to show your best effort, your nurse or respiratory therapist  will set it to a desired goal. If possible, sit up straight or lean slightly forward. Try not to slouch. Hold the incentive spirometer in an upright position. INSTRUCTIONS FOR USE  Sit on the edge of your bed if possible, or sit up as far as you can in bed or on a chair. Hold the incentive spirometer in an upright position. Breathe out normally. Place the mouthpiece in your mouth and seal your lips tightly around it. Breathe in slowly and as deeply as possible, raising the piston or the ball toward the top of the column. Hold your breath for 3-5 seconds or for as long as possible. Allow the piston or ball to fall to the bottom of the column. Remove the mouthpiece from your mouth and breathe out normally. Rest for a few seconds and repeat Steps 1 through 7 at least 10 times every 1-2 hours when you are awake. Take your time and take a few normal breaths between deep breaths. The spirometer may include an indicator to show your best effort. Use the indicator as a goal to work toward during each repetition. After each set of 10 deep breaths, practice coughing to be sure your lungs are clear. If you have an incision (the cut made at the time of surgery), support your incision when coughing by placing a pillow or rolled up towels firmly against it. Once you are able to get out of bed, walk around indoors and cough well. You may stop using the incentive spirometer when instructed by your caregiver.  RISKS AND COMPLICATIONS Take your time so you do not get dizzy or light-headed. If you are in pain, you may need to take or ask for pain medication before doing incentive spirometry. It is harder to take a deep breath if you are having pain. AFTER USE Rest and breathe slowly and easily. It can be helpful to keep track  of a log of your progress. Your caregiver can provide you with a simple table to help with this. If you are using the spirometer at home, follow these instructions: [place] IF:   You are having difficultly using the spirometer. You have trouble using the spirometer as often as instructed. Your pain medication is not giving enough relief while using the spirometer. You develop fever of 100.5 F (38.1 C) or higher. SEEK IMMEDIATE MEDICAL CARE IF:  You cough up bloody sputum that had not been present before. You develop fever of 102 F (38.9 C) or greater. You develop worsening pain at or near the incision site. MAKE SURE YOU:  Understand these instructions. Will watch your condition. Will get help right away if you are not doing well or get worse. Document Released: 12/28/2006 Document Revised: 11/09/2011 Document Reviewed: 02/28/2007 [facility] Patient Information 2014 [place], [place].   ________________________________________________________________________

## 2021-05-15 ENCOUNTER — Encounter (HOSPITAL_COMMUNITY)
Admission: RE | Admit: 2021-05-15 | Discharge: 2021-05-15 | Disposition: A | Discharge status: Home / Self Care | Payer: Medicare Other | Source: Ambulatory Visit | Attending: Orthopaedic Surgery | Admitting: Orthopaedic Surgery

## 2021-05-15 ENCOUNTER — Other Ambulatory Visit: Payer: Self-pay

## 2021-05-15 ENCOUNTER — Encounter (HOSPITAL_COMMUNITY): Payer: Self-pay

## 2021-05-15 ENCOUNTER — Encounter (HOSPITAL_COMMUNITY): Payer: Self-pay | Admitting: Physician Assistant

## 2021-05-15 DIAGNOSIS — Z01818 Encounter for other preprocedural examination: Secondary | ICD-10-CM | POA: Diagnosis present

## 2021-05-15 HISTORY — DX: Other specified disorders of parathyroid gland: E21.4

## 2021-05-15 HISTORY — DX: Myoneural disorder, unspecified: G70.9

## 2021-05-15 HISTORY — DX: Essential tremor: G25.0

## 2021-05-15 LAB — BASIC METABOLIC PANEL
Anion gap: 8 (ref 5–15)
BUN: 22 mg/dL (ref 8–23)
CO2: 25 mmol/L (ref 22–32)
Calcium: 9.2 mg/dL (ref 8.9–10.3)
Chloride: 105 mmol/L (ref 98–111)
Creatinine, Ser: 0.78 mg/dL (ref 0.44–1.00)
GFR, Estimated: >60 mL/min (ref >60)
Glucose, Bld: 98 mg/dL (ref 70–99)
Potassium: 4.4 mmol/L (ref 3.5–5.1)
Sodium: 138 mmol/L (ref 135–145)

## 2021-05-15 LAB — CBC
HCT: 42.9 % (ref 36.0–46.0)
Hemoglobin: 14.1 g/dL (ref 12.0–15.0)
MCH: 30.9 pg (ref 26.0–34.0)
MCHC: 32.9 g/dL (ref 30.0–36.0)
MCV: 93.9 fL (ref 80.0–100.0)
Platelets: 283 10*3/uL (ref 150–400)
RBC: 4.57 MIL/uL (ref 3.87–5.11)
RDW: 13.7 % (ref 11.5–15.5)
WBC: 6.2 10*3/uL (ref 4.0–10.5)
nRBC: 0 % (ref 0.0–0.2)

## 2021-05-15 LAB — SURGICAL PCR SCREEN
MRSA, PCR: NEGATIVE
Staphylococcus aureus: NEGATIVE

## 2021-05-15 NOTE — Progress Notes (Addendum)
PCP - Dr. Nira Conn Spry LOV 12-09-20 care every where Cardiologist - no  PPM/ICD -  Device Orders -  Rep Notified -   Chest x-ray -  EKG -  Stress Test -  ECHO -  Cardiac Cath -   Sleep Study -  CPAP -   Fasting Blood Sugar -  Checks Blood Sugar _____ times a day  Blood Thinner Instructions: Aspirin Instructions:  ERAS Protcol - PRE-SURGERY Ensure or G2-   COVID TEST- 05-21-21 COVID vaccine -moderna x2+one booster  Activity-- Able to do house work without SOB Anesthesia review: HTN BP elevated at preop 199/96, 179/102, Lyndon Code PA-C aware pt. To monitor BP at home and see PCP for follow up.   Patient denies shortness of breath, fever, cough and chest pain at PAT appointment   All instructions explained to the patient, with a verbal understanding of the material. Patient agrees to go over the instructions while at home for a better understanding. Patient also instructed to self quarantine after being tested for COVID-19. The opportunity to ask questions was provided.

## 2021-05-20 ENCOUNTER — Telehealth: Payer: Self-pay | Admitting: Orthopaedic Surgery

## 2021-05-20 NOTE — Telephone Encounter (Signed)
Pt called stating she is supposed to have surgery Friday but is having flu like symptoms. Pt would like a CB from [place]. to be advised what she should do.   [phone]

## 2021-05-20 NOTE — Telephone Encounter (Signed)
Pt called again to be advised about her surgery and would like a CB asap please.  [phone]

## 2021-05-23 ENCOUNTER — Ambulatory Visit (HOSPITAL_COMMUNITY): Admission: RE | Admit: 2021-05-23 | Payer: Medicare Other | Source: Home / Self Care | Admitting: Orthopaedic Surgery

## 2021-05-23 ENCOUNTER — Encounter (HOSPITAL_COMMUNITY): Admission: RE | Payer: Self-pay | Source: Home / Self Care

## 2021-05-23 LAB — TYPE AND SCREEN
ABO/RH(D): O POS
Antibody Screen: NEGATIVE

## 2021-05-23 SURGERY — ARTHROPLASTY, KNEE, TOTAL
Anesthesia: Spinal | Site: Knee | Laterality: Left

## 2021-06-04 ENCOUNTER — Telehealth: Payer: Self-pay

## 2021-06-04 NOTE — Telephone Encounter (Signed)
LMOM for patient letting her know the below message and told her to call me back and let me know

## 2021-06-04 NOTE — Telephone Encounter (Signed)
Patient wondering if she can have gel injections since surgery had to be cancelled

## 2021-06-05 ENCOUNTER — Encounter: Payer: Medicare Other | Admitting: Orthopaedic Surgery

## 2021-06-05 ENCOUNTER — Telehealth: Payer: Self-pay | Admitting: Orthopaedic Surgery

## 2021-06-05 NOTE — Telephone Encounter (Signed)
I called pt and she doesn't plan on getting a replacement any time soon. Would like to proceed with gel injection. Can you please get this authorized? Thank you

## 2021-06-05 NOTE — Telephone Encounter (Signed)
Noted  

## 2021-06-05 NOTE — Telephone Encounter (Signed)
Returning call for Alison Chandler about vm left yesterday. The best call back number is [phone] and asked if she could be called back after 4pm.

## 2021-06-06 NOTE — Telephone Encounter (Signed)
Spoke with patient and she has decided to wait until after the first of the year for surgery.  She will call me back when she is ready to be scheduled.

## 2021-06-24 ENCOUNTER — Encounter: Payer: Self-pay | Admitting: General Surgery

## 2021-07-09 ENCOUNTER — Other Ambulatory Visit: Payer: Self-pay

## 2021-07-09 ENCOUNTER — Emergency Department (HOSPITAL_BASED_OUTPATIENT_CLINIC_OR_DEPARTMENT_OTHER)
Admission: EM | Admit: 2021-07-09 | Discharge: 2021-07-09 | Disposition: A | Discharge status: Home / Self Care | Payer: Medicare Other | Attending: Emergency Medicine | Admitting: Emergency Medicine

## 2021-07-09 ENCOUNTER — Emergency Department (HOSPITAL_BASED_OUTPATIENT_CLINIC_OR_DEPARTMENT_OTHER): Payer: Medicare Other

## 2021-07-09 ENCOUNTER — Encounter (HOSPITAL_BASED_OUTPATIENT_CLINIC_OR_DEPARTMENT_OTHER): Payer: Self-pay | Admitting: *Deleted

## 2021-07-09 DIAGNOSIS — S82201A Unspecified fracture of shaft of right tibia, initial encounter for closed fracture: Secondary | ICD-10-CM | POA: Insufficient documentation

## 2021-07-09 DIAGNOSIS — Z96651 Presence of right artificial knee joint: Secondary | ICD-10-CM | POA: Insufficient documentation

## 2021-07-09 DIAGNOSIS — Z96641 Presence of right artificial hip joint: Secondary | ICD-10-CM | POA: Diagnosis not present

## 2021-07-09 DIAGNOSIS — Y9301 Activity, walking, marching and hiking: Secondary | ICD-10-CM | POA: Diagnosis not present

## 2021-07-09 DIAGNOSIS — E039 Hypothyroidism, unspecified: Secondary | ICD-10-CM | POA: Diagnosis not present

## 2021-07-09 DIAGNOSIS — S82141A Displaced bicondylar fracture of right tibia, initial encounter for closed fracture: Secondary | ICD-10-CM

## 2021-07-09 DIAGNOSIS — Z87891 Personal history of nicotine dependence: Secondary | ICD-10-CM | POA: Diagnosis not present

## 2021-07-09 DIAGNOSIS — Z79899 Other long term (current) drug therapy: Secondary | ICD-10-CM | POA: Insufficient documentation

## 2021-07-09 DIAGNOSIS — W231XXA Caught, crushed, jammed, or pinched between stationary objects, initial encounter: Secondary | ICD-10-CM | POA: Diagnosis not present

## 2021-07-09 DIAGNOSIS — I10 Essential (primary) hypertension: Secondary | ICD-10-CM | POA: Diagnosis not present

## 2021-07-09 DIAGNOSIS — S8991XA Unspecified injury of right lower leg, initial encounter: Secondary | ICD-10-CM | POA: Diagnosis present

## 2021-07-09 LAB — BASIC METABOLIC PANEL
Anion gap: 9 (ref 5–15)
BUN: 20 mg/dL (ref 8–23)
CO2: 23 mmol/L (ref 22–32)
Calcium: 9.4 mg/dL (ref 8.9–10.3)
Chloride: 106 mmol/L (ref 98–111)
Creatinine, Ser: 0.73 mg/dL (ref 0.44–1.00)
GFR, Estimated: >60 mL/min (ref >60)
Glucose, Bld: 129 mg/dL — ABNORMAL HIGH (ref 70–99)
Potassium: 3.6 mmol/L (ref 3.5–5.1)
Sodium: 138 mmol/L (ref 135–145)

## 2021-07-09 LAB — CBC WITH DIFFERENTIAL/PLATELET
Abs Immature Granulocytes: 0.05 10*3/uL (ref 0.00–0.07)
Basophils Absolute: 0 10*3/uL (ref 0.0–0.1)
Basophils Relative: 0 %
Eosinophils Absolute: 0.3 10*3/uL (ref 0.0–0.5)
Eosinophils Relative: 2 %
HCT: 41.8 % (ref 36.0–46.0)
Hemoglobin: 14 g/dL (ref 12.0–15.0)
Immature Granulocytes: 0 %
Lymphocytes Relative: 13 %
Lymphs Abs: 1.7 10*3/uL (ref 0.7–4.0)
MCH: 30.7 pg (ref 26.0–34.0)
MCHC: 33.5 g/dL (ref 30.0–36.0)
MCV: 91.7 fL (ref 80.0–100.0)
Monocytes Absolute: 0.9 10*3/uL (ref 0.1–1.0)
Monocytes Relative: 7 %
Neutro Abs: 9.6 10*3/uL — ABNORMAL HIGH (ref 1.7–7.7)
Neutrophils Relative %: 78 %
Platelets: 292 10*3/uL (ref 150–400)
RBC: 4.56 MIL/uL (ref 3.87–5.11)
RDW: 13.7 % (ref 11.5–15.5)
WBC: 12.5 10*3/uL — ABNORMAL HIGH (ref 4.0–10.5)
nRBC: 0 % (ref 0.0–0.2)

## 2021-07-09 MED ORDER — HYDROCODONE-ACETAMINOPHEN 5-325 MG PO TABS: 1.0000 | ORAL_TABLET | Freq: Four times a day (QID) | ORAL | 0 refills | Status: DC | PRN

## 2021-07-09 MED ORDER — HYDROCODONE-ACETAMINOPHEN 5-325 MG PO TABS
2.0000 | ORAL_TABLET | Freq: Once | ORAL | Status: AC
  Administered 2021-07-09: 2 via ORAL
  Filled 2021-07-09: qty 2

## 2021-07-09 MED ORDER — MORPHINE SULFATE (PF) 4 MG/ML IV SOLN
4.0000 mg | Freq: Once | INTRAVENOUS | Status: AC
  Administered 2021-07-09: 4 mg via INTRAVENOUS
  Filled 2021-07-09: qty 1

## 2021-07-09 NOTE — ED Triage Notes (Signed)
C/o mechanical fall with right knee injury x 1 hr ago

## 2021-07-09 NOTE — ED Notes (Signed)
Pt is in xray

## 2021-07-09 NOTE — Discharge Instructions (Addendum)
Take vicodin for pain   Please call your orthopedic doctor tomorrow to get follow-up  Use your walker for now and I contacted case management to get you a wheelchair tomorrow  Please wear the knee immobilizer when you are walking  Return to ER if you have worse knee pain, trouble walking

## 2021-07-09 NOTE — ED Notes (Signed)
X-ray at bedside

## 2021-07-09 NOTE — ED Notes (Signed)
Pt care taken, no complaints of pain without movement.

## 2021-07-09 NOTE — ED Provider Notes (Signed)
[facility] Provider Note   CSN: [id number] Arrival date & time: 07/09/21  1724     History Chief Complaint  Patient presents with   Lytle Michaels    Alison Chandler is a 80 y.o. female history of hypertension, hyperlipidemia, known knee arthritis, here presenting with right knee pain.  Patient states that she was just walking and her shoe got caught and she landed directly on the right knee.  She denies any other injuries.  Unable to bear weight afterwards.  Patient is not on any blood thinners. Follows up with Dr. Ninfa Linden from ortho.   The history is provided by the patient.      Past Medical History:  Diagnosis Date   Arthritis    Hearing loss    History of kidney stones    Hyperlipidemia    Hypertension    Neuromuscular disorder ([place])    neuropathy legs no medicine   Parathyroid dysfunction ([place])    one Quit working was removed   Tremor, essential     Patient Active Problem List   Diagnosis Date Noted   Unilateral primary osteoarthritis, left knee 12/19/2019   Benign essential hypertension 10/30/2019   Hypercalcemia 10/30/2019   Acute medial meniscal tear, right, initial encounter 08/16/2019   Iatrogenic hypocalcemia 06/05/2019   Primary hyperparathyroidism ([place])    Nuclear sclerotic cataract of both eyes 10/05/2018   RPE mottling of macula 10/05/2018   Flat foot 06/20/2018   Tibialis posterior tendonitis, left 06/20/2018   Acute pain of right shoulder 05/04/2018   Chronic pain of left knee 12/22/2017   Subjective memory complaints 10/08/2017   It band syndrome, left 08/11/2017   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   Meningioma ([place]) 03/23/2017   Idiopathic peripheral neuropathy 03/11/2017   Imbalance 03/11/2017   Paresthesias 12/06/2016   Tremor 12/06/2016   Age-related osteoporosis without current pathological fracture 12/03/2016   Nephrolithiasis 12/03/2016   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/21/2016   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   Hypertropia of left eye 09/05/2014   Intermittent exotropia of left eye 09/05/2014   Midline cystocele 05/10/2014   Stress bladder incontinence, female 05/10/2014   Benign neoplasm of colon 04/16/2014   Calculus of ureter 04/16/2014   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   Esophageal reflux 04/16/2014   Gross hematuria 04/16/2014   Headache 04/16/2014   Hypothyroidism 04/16/2014   Increased urinary frequency 04/16/2014   Keratoconjunctivitis sicca, 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 04/16/2014   Osteopenia, 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   Other seborrheic keratosis 04/16/2014   Other synovitis and tenosynovitis 04/16/2014   Presbycusis 04/16/2014   Primary localized osteoarthrosis, pelvic region and thigh 04/16/2014   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   Strabismus 04/16/2014   Urethral stenosis 04/16/2014   Urinary tract infection 04/16/2014   Allergic rhinitis 04/16/2014   Degenerative arthritis of hip 02/19/2012    Past Surgical History:  Procedure Laterality Date   colonscopy  01/27/2012   JOINT REPLACEMENT Right 2013   THR   KIDNEY STONE SURGERY     PARATHYROIDECTOMY N/A 05/10/2019   Procedure: PARATHYROIDECTOMY;  Surgeon: Fredirick Maudlin, MD;  Location: [facility] ORS;  Service: General;  Laterality: N/A;   TOTAL HIP ARTHROPLASTY  02/19/2012   Procedure: TOTAL HIP ARTHROPLASTY ANTERIOR APPROACH;  Surgeon: Mcarthur Rossetti, MD;  Location: [facility] ORS;  Service: Orthopedics;  Laterality: Right;     OB History     Gravida  2   Para  2   Term  2   Preterm  AB      Living  2      SAB      IAB      Ectopic      Multiple      Live Births           Obstetric Comments  Menstrual age: 4  Age 1st Pregnancy: 65          Family History  Problem Relation Age of Onset   Thyroid disease Mother    Lung cancer Mother 36   Breast cancer Mother 52   Parkinson's disease Father     Social History   Tobacco Use   Smoking status: Former     Packs/day: 0.25    Years: 20.00    Pack years: 5.00    Types: Cigarettes    Quit date: 09/01/1971    Years since quitting: 49.8   Smokeless tobacco: Never  Vaping Use   Vaping Use: Never used  Substance Use Topics   Alcohol use: Not Currently    Alcohol/week: 1.0 standard drink    Types: 1 Cans of beer per week   Drug use: No    Home Medications Prior to Admission medications   Medication Sig Start Date End Date Taking? Authorizing Provider  acetaminophen (TYLENOL) 500 MG tablet Take 500 mg by mouth in the morning and at bedtime.    [provider]  atenolol (TENORMIN) 50 MG tablet Take 50 mg by mouth every evening.     [provider]  atorvastatin (LIPITOR) 20 MG tablet Take 20 mg by mouth every evening.     [provider]  Cholecalciferol (DIALYVITE VITAMIN D 5000) 125 MCG (5000 UT) capsule Take 5,000 Units by mouth every evening.    [provider]  Cyanocobalamin (VITAMIN B-12 PO) Take 2,500 mcg by mouth every evening.    [provider]  GLUCOSAMINE-CHONDROITIN PO Take 1 tablet by mouth every evening.    [provider]  Multiple Minerals-Vitamins (CAL MAG ZINC +D3) TABS Take 1 tablet by mouth daily.    [provider]  naproxen sodium (ALEVE) 220 MG tablet Take 220 mg by mouth daily as needed (pain).    [provider]  Omega-3 Fatty Acids (FISH OIL) 1200 MG CAPS Take 1,200 mg by mouth every evening.    [provider]  Polyethyl Glycol-Propyl Glycol (SYSTANE OP) Place 1 drop into both eyes daily as needed (dry eyes).    [provider]    Allergies    Actonel [risedronate sodium] and Amlodipine  Review of Systems   Review of Systems  Musculoskeletal:        R knee pain   All other systems reviewed and are negative.  Physical Exam Updated Vital Signs BP (!) 190/99   Pulse 71   Temp 98.2 F (36.8 C) (Oral)   Resp 18   Ht 5\' 9"  (1.753 m)   Wt 97.5 kg   SpO2 97%   BMI 31.75  kg/m   Physical Exam Vitals and nursing note reviewed.  Constitutional:      Appearance: Normal appearance.  HENT:     Head: Normocephalic and atraumatic.     Nose: Nose normal.     Mouth/Throat:     Mouth: Mucous membranes are moist.  Eyes:     Extraocular Movements: Extraocular movements intact.     Pupils: Pupils are equal, round, and reactive to light.  Cardiovascular:     Rate and Rhythm: Normal rate and  regular rhythm.     Pulses: Normal pulses.     Heart sounds: Normal heart sounds.  Pulmonary:     Effort: Pulmonary effort is normal.     Breath sounds: Normal breath sounds.  Abdominal:     General: Abdomen is flat.     Palpations: Abdomen is soft.  Musculoskeletal:     Cervical back: Normal range of motion and neck supple.     Comments: Right knee swollen.  There is tenderness to the proximal tibial area.  Patient is unable to bend the knee.  Patient has 2+ DP pulse.  Skin:    General: Skin is warm.     Capillary Refill: Capillary refill takes less than 2 seconds.  Neurological:     General: No focal deficit present.     Mental Status: She is alert and oriented to person, place, and time.  Psychiatric:        Mood and Affect: Mood normal.        Behavior: Behavior normal.    ED Results / Procedures / Treatments   Labs (all labs ordered are listed, but only abnormal results are displayed) Labs Reviewed  CBC WITH DIFFERENTIAL/PLATELET - Abnormal; Notable for the following components:      Result Value   WBC 12.5 (*)    Neutro Abs 9.6 (*)    All other components within normal limits  BASIC METABOLIC PANEL - Abnormal; Notable for the following components:   Glucose, Bld 129 (*)    All other components within normal limits    EKG None  Radiology DG Tibia/Fibula Right  Result Date: 07/09/2021 CLINICAL DATA:  Fall today.  Right knee injury. EXAM: RIGHT TIBIA AND FIBULA - 2 VIEW COMPARISON:  Knee radiographs same date. FINDINGS: No change in the minimally  displaced intra-articular fracture of the proximal tibia, involving the lateral tibial plateau and tibial spines. The fibula appears intact. No distal injuries are identified. There is soft tissue swelling throughout the lower leg without evidence of foreign body. Mild degenerative changes are present at the ankle. IMPRESSION: Unchanged appearance of intra-articular fracture of the proximal tibia. No distal injury identified. Electronically Signed   By: Richardean Sale M.D.   On: 07/09/2021 19:14   CT Knee Right Wo Contrast  Result Date: 07/09/2021 CLINICAL DATA:  Knee fracture post fall. EXAM: CT OF THE RIGHT  KNEE WITHOUT CONTRAST TECHNIQUE: Multidetector CT imaging of the right knee was performed according to the standard protocol. Multiplanar CT image reconstructions were also generated. COMPARISON:  Radiographs same date. FINDINGS: Bones/Joint/Cartilage Intra-articular fracture of the proximal tibia involve the articular surface of the lateral tibial plateau and results in up to 2 mm displacement of the articular surface. There is no significant depression of the articular surface. The fracture also extends into the tibial spines, although spares the articular surface of the medial tibial plateau. There is distal extension of a nondisplaced component into the medial tibial metaphysis. There is no involvement of the proximal tibiofibular joint. The proximal fibula, distal femur and patella are intact. There are underlying moderate tricompartmental degenerative changes. There is a moderate size lipohemarthrosis. Ligaments Suboptimally assessed by CT. The cruciate ligaments appear grossly intact. Bilateral meniscal chondrocalcinosis. Muscles and Tendons No muscular abnormalities are identified. The extensor mechanism is intact. Soft tissues Small Baker's cyst. Mild femoropopliteal atherosclerosis. No evidence of foreign body or soft tissue emphysema. IMPRESSION: 1. Minimally displaced intra-articular fracture  of the proximal tibia as described, involving the articular surface of the  lateral tibial plateau. 2. Associated lipohemarthrosis. 3. The distal femur and proximal fibula are intact. Underlying moderate tricompartmental degenerative changes. Electronically Signed   By: Richardean Sale M.D.   On: 07/09/2021 19:56   DG Knee Complete 4 Views Right  Result Date: 07/09/2021 CLINICAL DATA:  Fall, mechanical fall with RIGHT knee injury 1 hour ago. EXAM: RIGHT KNEE - COMPLETE 4+ VIEW COMPARISON:  March of 2022. FINDINGS: Oblique fracture of the lateral or tibial plateau with comminution extending also through the tibial spines and obliquely from lateral to medial across the tibial metaphysis with mild displacement of the medial fracture fragment which includes the medial tibial plateau and a portion of the tibial metaphysis. Associated moderate joint effusion. Osteopenia and degenerative changes as on previous imaging. IMPRESSION: Tibial plateau fracture extending obliquely across the tibial metaphysis from lateral tibial plateau to medial tibial metaphysis with mild depression as result of the lateral tibial plateau. Mild comminution includes extension across the tibial spines. Joint effusion associated with above fractures. Electronically Signed   By: Zetta Bills M.D.   On: 07/09/2021 18:10    Procedures Procedures   Medications Ordered in ED Medications  morphine 4 MG/ML injection 4 mg (4 mg Intravenous Given 07/09/21 1959)  HYDROcodone-acetaminophen (NORCO/VICODIN) 5-325 MG per tablet 2 tablet (2 tablets Oral Given 07/09/21 2050)    ED Course  I have reviewed the triage vital signs and the nursing notes.  Pertinent labs & imaging results that were available during my care of the patient were reviewed by me and considered in my medical decision making (see chart for details).    MDM Rules/Calculators/A&P                           XYLINA RHOADS is a 80 y.o. female here with right knee pain and  swelling after fall.  Concern for possible contusion versus fracture.  Will get knee and tib-fib x-rays. Will give pain meds   9:08 PM X-ray showed tibial plateau fracture.  Discussed with Dr. Trevor Mace partner, Dr. Durward Fortes.  He recommend a CT of the right knee.  He recommend that patient follow-up in the office tomorrow with Dr. Ninfa Linden to talk about surgical options. Patient does have a walker at home.  I also contacted case management to get a wheelchair  Final Clinical Impression(s) / ED Diagnoses Final diagnoses:  None    Rx / DC Orders ED Discharge Orders          Ordered    For home use only DME standard manual wheelchair with seat cushion       Comments: Patient suffers from R tibial plateau fracture which impairs their ability to perform daily activities like bathing, dressing, feeding, grooming, and toileting in the home.  A crutch will not resolve issue with performing activities of daily living. A wheelchair will allow patient to safely perform daily activities. Patient can safely propel the wheelchair in the home or has a caregiver who can provide assistance. Length of need 6 months . Accessories: elevating leg rests (ELRs), wheel locks, extensions and anti-tippers.   07/09/21 1919             Drenda Freeze, MD 07/09/21 2110

## 2021-07-10 ENCOUNTER — Encounter: Payer: Self-pay | Admitting: Orthopaedic Surgery

## 2021-07-10 ENCOUNTER — Telehealth: Payer: Self-pay

## 2021-07-10 ENCOUNTER — Telehealth: Payer: Self-pay | Admitting: Surgery

## 2021-07-10 ENCOUNTER — Ambulatory Visit (INDEPENDENT_AMBULATORY_CARE_PROVIDER_SITE_OTHER): Payer: Medicare Other | Admitting: Orthopaedic Surgery

## 2021-07-10 DIAGNOSIS — S82141A Displaced bicondylar fracture of right tibia, initial encounter for closed fracture: Secondary | ICD-10-CM | POA: Diagnosis not present

## 2021-07-10 MED ORDER — TRAMADOL HCL 50 MG PO TABS: 50.0000 mg | ORAL_TABLET | Freq: Four times a day (QID) | ORAL | 0 refills | Status: DC | PRN

## 2021-07-10 MED ORDER — ONDANSETRON 4 MG PO TBDP: 4.0000 mg | ORAL_TABLET | Freq: Three times a day (TID) | ORAL | 0 refills | Status: DC | PRN

## 2021-07-10 NOTE — Care Management (Signed)
Addendum 07/10/21  1747 W. Stann Mainland RN  BSN  [place] order for w/c  sent order for w/c into [facility]  11/9  request to be delivered to patient's home.  Received a call today from [facility] ED Charge Nurse about w/c not being delivered.  [employer] [facility] liaison Andree Coss was contacted to follow up .

## 2021-07-10 NOTE — Progress Notes (Signed)
The patient is well-known to me.  She is a 80 year old female who unfortunately sustained a mechanical fall at her home yesterday when she tripped and fell landing on her right knee.  She was seen in the emergency room and found to have a tibial plateau fracture.  Plain films and CT scan were obtained and she is placed in knee immobilizer.  They called my partners on call since she requested our group.  They had her follow-up in the office today.  She is in a wheelchair but needs one for home.  Her husband is with her.  She is having nausea from the narcotic pain medications.  She does report left knee pain but is not severe.  She has had no acute change in her medical status.  She denies shortness of breath or chest pain.  She denies any loss of consciousness.  She is had no other issues and this was just an accidental fall where she tripped.  Examination of her right knee shows significant swelling.  Her calf is soft.  She is neurovascularly intact.  The plain films CT scan reviewed with her and she does have a significant tibial plateau fracture which was bicondylar.  It is minimally displaced but is significant enough from a stability standpoint to warrant surgery.  I described in detail what the surgery is for her right knee that we are recommending.  For now she will remain nonweightbearing but she can attempt to bend her knee if she is comfortable on that right side.  We will work on getting her set up for surgery for next week.  I did discuss her intraoperative and postoperative course and discussed the risk and benefits of surgery.  We will work on getting this scheduled and again we will see her back then at 2 weeks postoperative for the repeat AP and lateral of her right knee.  All question concerns were answered and addressed.  I will send in some tramadol and nausea  medication.                                                                                                                                                                                                                                                                                                                                                                         ++

## 2021-07-10 NOTE — Telephone Encounter (Signed)
Patient's husband wanted to know if patient could be worked in to see Dr. Ninfa Linden for right tibial Fx.  Seen at [facility] ED.  Cb# [phone].  Please advise.  Thank you.

## 2021-07-10 NOTE — Telephone Encounter (Signed)
Called and scheduled.Marland Kitchen...will come in today @ 1:15

## 2021-07-10 NOTE — Telephone Encounter (Signed)
Patient went to the Ed, Alison Chandler told her to followup with [place] Is this something I need to work in today or can it wait until next week?

## 2021-07-10 NOTE — Telephone Encounter (Signed)
[facility] ED RNCM contacted patient to update her on the delivery of the w/c. Spoke with [employer] liaison states, it should be delivered by tomorrow morning.

## 2021-07-11 ENCOUNTER — Other Ambulatory Visit: Payer: Self-pay | Admitting: Physician Assistant

## 2021-07-11 DIAGNOSIS — S82141A Displaced bicondylar fracture of right tibia, initial encounter for closed fracture: Secondary | ICD-10-CM

## 2021-07-14 ENCOUNTER — Encounter (HOSPITAL_COMMUNITY): Payer: Self-pay | Admitting: Orthopaedic Surgery

## 2021-07-14 ENCOUNTER — Other Ambulatory Visit: Payer: Self-pay

## 2021-07-14 NOTE — Progress Notes (Signed)
PCP - Dr. Harrell Lark Cardiologist -  EKG - 05/15/21 Chest x-ray -  ECHO -  Cardiac Cath -  CPAP -     Blood Thinner Instructions:  Aspirin Instructions:   ERAS Protcol - n/a - clears 1330 DOS  COVID TEST- DOS  Anesthesia review: n/a  -------------  SDW INSTRUCTIONS:  Your procedure is scheduled on Tuesday 11/15. Please report to [facility] "A" at 1:30 P.M., and check in at the Admitting office. Call this number if you have problems the morning of surgery: [phone]   Remember: Do not eat after midnight the night before your surgery  You may drink clear liquids until 1:30 PM thee day of your surgery.   Clear liquids allowed are: Water, Non-Citrus Juices (without pulp), Carbonated Beverages, Clear Tea, Black Coffee Only, and Gatorade   Medications to take morning of surgery with a sip of water include: Tylenol if needed  As of today, STOP taking any Aspirin (unless otherwise instructed by your surgeon), Aleve, Naproxen, Ibuprofen, Motrin, Advil, Goody's, BC's, all herbal medications, fish oil, and all vitamins.    The Morning of Surgery Do not wear jewelry, make-up or nail polish. Do not wear lotions, powders, or perfumes or deodorant Do not shave 48 hours prior to surgery.   Men may shave face and neck. Do not bring valuables to the hospital. [facility] is not responsible for any belongings or valuables.  If you are a smoker, DO NOT Smoke 24 hours prior to surgery  If you wear a CPAP at night please bring your mask the morning of surgery   Remember that you must have someone to transport you home after your surgery, and remain with you for 24 hours if you are discharged the same day.  Please bring cases for contacts, glasses, hearing aids, dentures or bridgework because it cannot be worn into surgery.   Patients discharged the day of surgery will not be allowed to drive home.   Please shower the NIGHT BEFORE/MORNING OF SURGERY (use antibacterial soap  like DIAL soap if possible). Wear comfortable clothes the morning of surgery. Oral Hygiene is also important to reduce your risk of infection.  Remember - BRUSH YOUR TEETH THE MORNING OF SURGERY WITH YOUR REGULAR TOOTHPASTE  Patient denies shortness of breath, fever, cough and chest pain.

## 2021-07-15 ENCOUNTER — Inpatient Hospital Stay (HOSPITAL_COMMUNITY): Payer: Medicare Other

## 2021-07-15 ENCOUNTER — Encounter (HOSPITAL_COMMUNITY): Payer: Self-pay | Admitting: Orthopaedic Surgery

## 2021-07-15 ENCOUNTER — Encounter (HOSPITAL_COMMUNITY)
Admission: RE | Disposition: A | Discharge status: Skilled Nursing Facility | Payer: Self-pay | Source: Ambulatory Visit | Attending: Orthopaedic Surgery

## 2021-07-15 ENCOUNTER — Inpatient Hospital Stay (HOSPITAL_COMMUNITY): Payer: Medicare Other | Admitting: Anesthesiology

## 2021-07-15 ENCOUNTER — Inpatient Hospital Stay (HOSPITAL_COMMUNITY)
Admission: RE | Admit: 2021-07-15 | Discharge: 2021-07-18 | DRG: 494 | Disposition: A | Discharge status: Skilled Nursing Facility | Payer: Medicare Other | Source: Ambulatory Visit | Attending: Orthopaedic Surgery | Admitting: Orthopaedic Surgery

## 2021-07-15 ENCOUNTER — Other Ambulatory Visit: Payer: Self-pay

## 2021-07-15 DIAGNOSIS — E785 Hyperlipidemia, unspecified: Secondary | ICD-10-CM | POA: Diagnosis present

## 2021-07-15 DIAGNOSIS — M1990 Unspecified osteoarthritis, unspecified site: Secondary | ICD-10-CM | POA: Diagnosis present

## 2021-07-15 DIAGNOSIS — I10 Essential (primary) hypertension: Secondary | ICD-10-CM | POA: Diagnosis present

## 2021-07-15 DIAGNOSIS — G629 Polyneuropathy, unspecified: Secondary | ICD-10-CM | POA: Diagnosis present

## 2021-07-15 DIAGNOSIS — G250 Essential tremor: Secondary | ICD-10-CM | POA: Diagnosis present

## 2021-07-15 DIAGNOSIS — W19XXXA Unspecified fall, initial encounter: Secondary | ICD-10-CM | POA: Diagnosis present

## 2021-07-15 DIAGNOSIS — Z419 Encounter for procedure for purposes other than remedying health state, unspecified: Secondary | ICD-10-CM

## 2021-07-15 DIAGNOSIS — Z87891 Personal history of nicotine dependence: Secondary | ICD-10-CM | POA: Diagnosis not present

## 2021-07-15 DIAGNOSIS — E892 Postprocedural hypoparathyroidism: Secondary | ICD-10-CM | POA: Diagnosis present

## 2021-07-15 DIAGNOSIS — Z20822 Contact with and (suspected) exposure to covid-19: Secondary | ICD-10-CM | POA: Diagnosis present

## 2021-07-15 DIAGNOSIS — H9190 Unspecified hearing loss, unspecified ear: Secondary | ICD-10-CM | POA: Diagnosis present

## 2021-07-15 DIAGNOSIS — Z87442 Personal history of urinary calculi: Secondary | ICD-10-CM

## 2021-07-15 DIAGNOSIS — S82141A Displaced bicondylar fracture of right tibia, initial encounter for closed fracture: Secondary | ICD-10-CM | POA: Diagnosis present

## 2021-07-15 DIAGNOSIS — Y92009 Unspecified place in unspecified non-institutional (private) residence as the place of occurrence of the external cause: Secondary | ICD-10-CM

## 2021-07-15 DIAGNOSIS — Z79899 Other long term (current) drug therapy: Secondary | ICD-10-CM

## 2021-07-15 HISTORY — PX: ORIF TIBIA PLATEAU: SHX2132

## 2021-07-15 LAB — SURGICAL PCR SCREEN
MRSA, PCR: NEGATIVE
Staphylococcus aureus: NEGATIVE

## 2021-07-15 LAB — TYPE AND SCREEN
ABO/RH(D): O POS
Antibody Screen: NEGATIVE

## 2021-07-15 LAB — SARS CORONAVIRUS 2 BY RT PCR (HOSPITAL ORDER, PERFORMED IN ~~LOC~~ HOSPITAL LAB): SARS Coronavirus 2: NEGATIVE

## 2021-07-15 SURGERY — OPEN REDUCTION INTERNAL FIXATION (ORIF) TIBIAL PLATEAU
Anesthesia: Spinal | Site: Knee | Laterality: Right

## 2021-07-15 MED ORDER — METOCLOPRAMIDE HCL 5 MG/ML IJ SOLN: 5.0000 mg | Freq: Three times a day (TID) | INTRAMUSCULAR | Status: DC | PRN

## 2021-07-15 MED ORDER — EPHEDRINE SULFATE-NACL 50-0.9 MG/10ML-% IV SOSY
PREFILLED_SYRINGE | INTRAVENOUS | Status: DC | PRN
  Administered 2021-07-15 (×2): 5 mg via INTRAVENOUS

## 2021-07-15 MED ORDER — PROPOFOL 500 MG/50ML IV EMUL
INTRAVENOUS | Status: DC | PRN
  Administered 2021-07-15: 100 ug/kg/min via INTRAVENOUS

## 2021-07-15 MED ORDER — CHLORHEXIDINE GLUCONATE 0.12 % MT SOLN
15.0000 mL | Freq: Once | OROMUCOSAL | Status: AC
  Administered 2021-07-15: 15 mL via OROMUCOSAL
  Filled 2021-07-15: qty 15

## 2021-07-15 MED ORDER — METHOCARBAMOL 500 MG PO TABS
ORAL_TABLET | ORAL | Status: AC
  Filled 2021-07-15: qty 1

## 2021-07-15 MED ORDER — ONDANSETRON HCL 4 MG/2ML IJ SOLN: 4.0000 mg | Freq: Once | INTRAMUSCULAR | Status: DC | PRN

## 2021-07-15 MED ORDER — HYDRALAZINE HCL 20 MG/ML IJ SOLN
5.0000 mg | INTRAMUSCULAR | Status: DC | PRN
  Administered 2021-07-15: 5 mg via INTRAVENOUS

## 2021-07-15 MED ORDER — CHOLECALCIFEROL 125 MCG (5000 UT) PO CAPS: 5000.0000 [IU] | ORAL_CAPSULE | Freq: Every evening | ORAL | Status: DC

## 2021-07-15 MED ORDER — ACETAMINOPHEN 500 MG PO TABS
1000.0000 mg | ORAL_TABLET | Freq: Once | ORAL | Status: AC
  Administered 2021-07-15: 1000 mg via ORAL
  Filled 2021-07-15: qty 2

## 2021-07-15 MED ORDER — ONDANSETRON HCL 4 MG/2ML IJ SOLN: 4.0000 mg | Freq: Four times a day (QID) | INTRAMUSCULAR | Status: DC | PRN

## 2021-07-15 MED ORDER — DOCUSATE SODIUM 100 MG PO CAPS
100.0000 mg | ORAL_CAPSULE | Freq: Two times a day (BID) | ORAL | Status: DC
  Administered 2021-07-15 – 2021-07-18 (×6): 100 mg via ORAL
  Filled 2021-07-15 (×6): qty 1

## 2021-07-15 MED ORDER — CAL MAG ZINC +D3 PO TABS: 1.0000 | ORAL_TABLET | Freq: Every day | ORAL | Status: DC

## 2021-07-15 MED ORDER — FENTANYL CITRATE (PF) 250 MCG/5ML IJ SOLN
INTRAMUSCULAR | Status: AC
  Filled 2021-07-15: qty 5

## 2021-07-15 MED ORDER — ONDANSETRON HCL 4 MG PO TABS
4.0000 mg | ORAL_TABLET | Freq: Four times a day (QID) | ORAL | Status: DC | PRN
  Administered 2021-07-15: 4 mg via ORAL
  Filled 2021-07-15: qty 1

## 2021-07-15 MED ORDER — PHENYLEPHRINE HCL-NACL 20-0.9 MG/250ML-% IV SOLN
INTRAVENOUS | Status: DC | PRN
  Administered 2021-07-15: 20 ug/min via INTRAVENOUS

## 2021-07-15 MED ORDER — CEFAZOLIN SODIUM-DEXTROSE 1-4 GM/50ML-% IV SOLN
1.0000 g | Freq: Four times a day (QID) | INTRAVENOUS | Status: AC
  Administered 2021-07-15 – 2021-07-16 (×3): 1 g via INTRAVENOUS
  Filled 2021-07-15 (×3): qty 50

## 2021-07-15 MED ORDER — HYDROCODONE-ACETAMINOPHEN 5-325 MG PO TABS
ORAL_TABLET | ORAL | Status: AC
  Filled 2021-07-15: qty 1

## 2021-07-15 MED ORDER — ORAL CARE MOUTH RINSE: 15.0000 mL | Freq: Once | OROMUCOSAL | Status: AC

## 2021-07-15 MED ORDER — 0.9 % SODIUM CHLORIDE (POUR BTL) OPTIME
TOPICAL | Status: DC | PRN
  Administered 2021-07-15: 1000 mL

## 2021-07-15 MED ORDER — METHOCARBAMOL 500 MG PO TABS
500.0000 mg | ORAL_TABLET | Freq: Four times a day (QID) | ORAL | Status: DC | PRN
  Administered 2021-07-15: 500 mg via ORAL

## 2021-07-15 MED ORDER — DIPHENHYDRAMINE HCL 12.5 MG/5ML PO ELIX: 12.5000 mg | ORAL_SOLUTION | ORAL | Status: DC | PRN

## 2021-07-15 MED ORDER — METOCLOPRAMIDE HCL 5 MG PO TABS: 5.0000 mg | ORAL_TABLET | Freq: Three times a day (TID) | ORAL | Status: DC | PRN

## 2021-07-15 MED ORDER — ATORVASTATIN CALCIUM 10 MG PO TABS
20.0000 mg | ORAL_TABLET | Freq: Every day | ORAL | Status: DC
  Administered 2021-07-15 – 2021-07-17 (×3): 20 mg via ORAL
  Filled 2021-07-15 (×3): qty 2

## 2021-07-15 MED ORDER — FENTANYL CITRATE (PF) 250 MCG/5ML IJ SOLN
INTRAMUSCULAR | Status: DC | PRN
  Administered 2021-07-15 (×2): 50 ug via INTRAVENOUS

## 2021-07-15 MED ORDER — MORPHINE SULFATE (PF) 2 MG/ML IV SOLN
0.5000 mg | INTRAVENOUS | Status: DC | PRN
  Administered 2021-07-15 – 2021-07-16 (×2): 1 mg via INTRAVENOUS
  Filled 2021-07-15 (×2): qty 1

## 2021-07-15 MED ORDER — POVIDONE-IODINE 10 % EX SWAB
2.0000 "application " | Freq: Once | CUTANEOUS | Status: AC
  Administered 2021-07-15: 2 via TOPICAL

## 2021-07-15 MED ORDER — HYDROCODONE-ACETAMINOPHEN 7.5-325 MG PO TABS
1.0000 | ORAL_TABLET | ORAL | Status: DC | PRN
  Administered 2021-07-15: 1 via ORAL
  Administered 2021-07-16 (×2): 2 via ORAL
  Filled 2021-07-15: qty 2
  Filled 2021-07-15: qty 1
  Filled 2021-07-15: qty 2
  Filled 2021-07-15: qty 1

## 2021-07-15 MED ORDER — ACETAMINOPHEN 325 MG PO TABS
325.0000 mg | ORAL_TABLET | Freq: Four times a day (QID) | ORAL | Status: DC | PRN
  Administered 2021-07-17 – 2021-07-18 (×3): 650 mg via ORAL
  Filled 2021-07-15 (×3): qty 2

## 2021-07-15 MED ORDER — FENTANYL CITRATE (PF) 100 MCG/2ML IJ SOLN
INTRAMUSCULAR | Status: AC
  Filled 2021-07-15: qty 2

## 2021-07-15 MED ORDER — ONDANSETRON HCL 4 MG/2ML IJ SOLN
INTRAMUSCULAR | Status: DC | PRN
  Administered 2021-07-15: 4 mg via INTRAVENOUS

## 2021-07-15 MED ORDER — BUPIVACAINE IN DEXTROSE 0.75-8.25 % IT SOLN
INTRATHECAL | Status: DC | PRN
  Administered 2021-07-15: 1.8 mL via INTRATHECAL

## 2021-07-15 MED ORDER — SODIUM CHLORIDE 0.9 % IV SOLN: INTRAVENOUS | Status: DC

## 2021-07-15 MED ORDER — HYDROCODONE-ACETAMINOPHEN 5-325 MG PO TABS
1.0000 | ORAL_TABLET | ORAL | Status: DC | PRN
  Administered 2021-07-15 – 2021-07-17 (×4): 1 via ORAL
  Filled 2021-07-15 (×2): qty 1

## 2021-07-15 MED ORDER — CEFAZOLIN SODIUM-DEXTROSE 2-4 GM/100ML-% IV SOLN
2.0000 g | INTRAVENOUS | Status: DC
  Filled 2021-07-15: qty 100

## 2021-07-15 MED ORDER — PROPOFOL 10 MG/ML IV BOLUS
INTRAVENOUS | Status: DC | PRN
  Administered 2021-07-15: 20 mg via INTRAVENOUS

## 2021-07-15 MED ORDER — LACTATED RINGERS IV SOLN: INTRAVENOUS | Status: DC

## 2021-07-15 MED ORDER — DEXAMETHASONE SODIUM PHOSPHATE 10 MG/ML IJ SOLN
INTRAMUSCULAR | Status: DC | PRN
  Administered 2021-07-15: 5 mg via INTRAVENOUS

## 2021-07-15 MED ORDER — METHOCARBAMOL 1000 MG/10ML IJ SOLN
500.0000 mg | Freq: Four times a day (QID) | INTRAVENOUS | Status: DC | PRN
  Filled 2021-07-15: qty 5

## 2021-07-15 MED ORDER — HYDRALAZINE HCL 20 MG/ML IJ SOLN
INTRAMUSCULAR | Status: AC
  Administered 2021-07-15: 5 mg via INTRAVENOUS
  Filled 2021-07-15: qty 1

## 2021-07-15 MED ORDER — FENTANYL CITRATE (PF) 100 MCG/2ML IJ SOLN
25.0000 ug | INTRAMUSCULAR | Status: DC | PRN
  Administered 2021-07-15 (×3): 50 ug via INTRAVENOUS

## 2021-07-15 MED ORDER — CEFAZOLIN SODIUM-DEXTROSE 2-3 GM-%(50ML) IV SOLR
INTRAVENOUS | Status: DC | PRN
  Administered 2021-07-15: 2 g via INTRAVENOUS

## 2021-07-15 MED ORDER — CHLORHEXIDINE GLUCONATE 4 % EX LIQD: 60.0000 mL | Freq: Once | CUTANEOUS | Status: DC

## 2021-07-15 MED ORDER — AMISULPRIDE (ANTIEMETIC) 5 MG/2ML IV SOLN: 10.0000 mg | Freq: Once | INTRAVENOUS | Status: DC | PRN

## 2021-07-15 MED ORDER — ATENOLOL 50 MG PO TABS
50.0000 mg | ORAL_TABLET | Freq: Every evening | ORAL | Status: DC
  Administered 2021-07-15 – 2021-07-18 (×4): 50 mg via ORAL
  Filled 2021-07-15 (×3): qty 1

## 2021-07-15 SURGICAL SUPPLY — 76 items
BAG COUNTER SPONGE SURGICOUNT (BAG) ×2 IMPLANT
BANDAGE ESMARK 6X9 LF (GAUZE/BANDAGES/DRESSINGS) ×1 IMPLANT
BIT DRILL 2.5MM SMALL QC EVOS (BIT) ×1 IMPLANT
BNDG COHESIVE 4X5 TAN STRL (GAUZE/BANDAGES/DRESSINGS) ×2 IMPLANT
BNDG ELASTIC 4X5.8 VLCR STR LF (GAUZE/BANDAGES/DRESSINGS) ×2 IMPLANT
BNDG ELASTIC 6X5.8 VLCR STR LF (GAUZE/BANDAGES/DRESSINGS) ×2 IMPLANT
BNDG ESMARK 6X9 LF (GAUZE/BANDAGES/DRESSINGS) ×2
CLEANER TIP ELECTROSURG 2X2 (MISCELLANEOUS) ×2 IMPLANT
COVER MAYO STAND STRL (DRAPES) ×2 IMPLANT
CUFF TOURN SGL QUICK 34 (TOURNIQUET CUFF) ×1
CUFF TOURN SGL QUICK 42 (TOURNIQUET CUFF) IMPLANT
CUFF TRNQT CYL 34X4.125X (TOURNIQUET CUFF) ×1 IMPLANT
DRAPE C-ARM 42X72 X-RAY (DRAPES) ×2 IMPLANT
DRAPE ORTHO SPLIT 77X108 STRL (DRAPES) ×2
DRAPE SURG ORHT 6 SPLT 77X108 (DRAPES) ×2 IMPLANT
DRAPE U-SHAPE 47X51 STRL (DRAPES) ×2 IMPLANT
DRILL 2.5MM SMALL QC EVOS (BIT) ×2
DRSG PAD ABDOMINAL 8X10 ST (GAUZE/BANDAGES/DRESSINGS) ×2 IMPLANT
DURAPREP 26ML APPLICATOR (WOUND CARE) ×2 IMPLANT
ELECT REM PT RETURN 9FT ADLT (ELECTROSURGICAL) ×2
ELECTRODE REM PT RTRN 9FT ADLT (ELECTROSURGICAL) ×1 IMPLANT
EVACUATOR 1/8 PVC DRAIN (DRAIN) IMPLANT
GAUZE SPONGE 4X4 12PLY STRL (GAUZE/BANDAGES/DRESSINGS) ×2 IMPLANT
GAUZE XEROFORM 1X8 LF (GAUZE/BANDAGES/DRESSINGS) ×2 IMPLANT
GLOVE SRG 8 PF TXTR STRL LF DI (GLOVE) ×1 IMPLANT
GLOVE SURG ENC MOIS LTX SZ8 (GLOVE) ×2 IMPLANT
GLOVE SURG ORTHO LTX SZ7.5 (GLOVE) ×2 IMPLANT
GLOVE SURG UNDER POLY LF SZ8 (GLOVE) ×1
GOWN STRL REUS W/ TWL LRG LVL3 (GOWN DISPOSABLE) ×2 IMPLANT
GOWN STRL REUS W/ TWL XL LVL3 (GOWN DISPOSABLE) ×2 IMPLANT
GOWN STRL REUS W/TWL 2XL LVL3 (GOWN DISPOSABLE) ×2 IMPLANT
GOWN STRL REUS W/TWL LRG LVL3 (GOWN DISPOSABLE) ×2
GOWN STRL REUS W/TWL XL LVL3 (GOWN DISPOSABLE) ×2
IMMOBILIZER KNEE 22 UNIV (SOFTGOODS) IMPLANT
K-WIRE 1.6 (WIRE) ×1
K-WIRE FX150X1.6XTROC PNT (WIRE) ×1
KIT BASIN OR (CUSTOM PROCEDURE TRAY) ×2 IMPLANT
KIT TURNOVER KIT B (KITS) ×2 IMPLANT
KWIRE FX150X1.6XTROC PNT (WIRE) ×1 IMPLANT
MANIFOLD NEPTUNE II (INSTRUMENTS) IMPLANT
NS IRRIG 1000ML POUR BTL (IV SOLUTION) ×2 IMPLANT
PACK ORTHO EXTREMITY (CUSTOM PROCEDURE TRAY) ×2 IMPLANT
PAD ARMBOARD 7.5X6 YLW CONV (MISCELLANEOUS) ×4 IMPLANT
PAD CAST 4YDX4 CTTN HI CHSV (CAST SUPPLIES) ×1 IMPLANT
PADDING CAST COTTON 4X4 STRL (CAST SUPPLIES) ×1
PADDING CAST COTTON 6X4 STRL (CAST SUPPLIES) ×2 IMPLANT
PENCIL BUTTON HOLSTER BLD 10FT (ELECTRODE) ×2 IMPLANT
PLATE LAT PROX TIBIA 8H RT (Plate) ×2 IMPLANT
SCREW CORT ST EVOS 3.5X55 (Screw) ×2 IMPLANT
SCREW CORT ST EVOS 3.5X70 (Screw) ×2 IMPLANT
SCREW CORT ST EVOS 3.5X75 (Screw) ×2 IMPLANT
SCREW CTX 3.5X36MM EVOS (Screw) ×4 IMPLANT
SCREW CTX 3.5X38MM EVOS (Screw) ×2 IMPLANT
SCREW CTX ST EVOS 3.5X40 (Screw) ×2 IMPLANT
SCREW LOCK 3.5X65MM (Screw) ×2 IMPLANT
SCREW LOCK EVOS ST 3.5X85 (Screw) ×4 IMPLANT
SCREW LOCK ST EVOS 3.5 X 80 (Screw) ×2 IMPLANT
SCREW LOCKING 3.5X75 (Screw) ×4 IMPLANT
SPONGE T-LAP 18X18 ~~LOC~~+RFID (SPONGE) ×2 IMPLANT
STAPLER VISISTAT 35W (STAPLE) ×2 IMPLANT
STOCKINETTE IMPERVIOUS 9X36 MD (GAUZE/BANDAGES/DRESSINGS) ×2 IMPLANT
STOCKINETTE IMPERVIOUS LG (DRAPES) ×2 IMPLANT
SUCTION FRAZIER HANDLE 10FR (MISCELLANEOUS) ×1
SUCTION TUBE FRAZIER 10FR DISP (MISCELLANEOUS) ×1 IMPLANT
SUT ETHILON 2 0 FS 18 (SUTURE) ×6 IMPLANT
SUT VIC AB 0 CT1 27 (SUTURE) ×2
SUT VIC AB 0 CT1 27XBRD ANBCTR (SUTURE) ×2 IMPLANT
SUT VIC AB 2-0 CT1 27 (SUTURE) ×1
SUT VIC AB 2-0 CT1 TAPERPNT 27 (SUTURE) ×1 IMPLANT
SYR BULB IRRIG 60ML STRL (SYRINGE) ×2 IMPLANT
TOWEL GREEN STERILE (TOWEL DISPOSABLE) ×2 IMPLANT
TOWEL GREEN STERILE FF (TOWEL DISPOSABLE) ×2 IMPLANT
TUBE CONNECTING 12X1/4 (SUCTIONS) ×2 IMPLANT
UNDERPAD 30X36 HEAVY ABSORB (UNDERPADS AND DIAPERS) ×2 IMPLANT
WATER STERILE IRR 1000ML POUR (IV SOLUTION) IMPLANT
YANKAUER SUCT BULB TIP NO VENT (SUCTIONS) ×2 IMPLANT

## 2021-07-15 NOTE — Anesthesia Procedure Notes (Signed)
Spinal  Patient location during procedure: OR Start time: 07/15/2021 3:25 PM End time: 07/15/2021 3:30 PM Reason for block: surgical anesthesia Staffing Performed: anesthesiologist  Anesthesiologist: Brennan Bailey, MD Preanesthetic Checklist Completed: patient identified, IV checked, risks and benefits discussed, surgical consent, monitors and equipment checked, pre-op evaluation and timeout performed Spinal Block Patient position: sitting Prep: DuraPrep and site prepped and draped Patient monitoring: continuous pulse ox, blood pressure and heart rate Approach: midline Location: L3-4 Injection technique: single-shot Needle Needle type: Pencan  Needle gauge: 24 G Needle length: 9 cm Assessment Events: CSF return Additional Notes Risks, benefits, and alternative discussed. Patient gave consent to procedure. Prepped and draped in sitting position. Patient sedated but responsive to voice. Clear CSF obtained after one needle redirection. Positive terminal aspiration. No pain or paraesthesias with injection. Patient tolerated procedure well. Vital signs stable. Tawny Asal, MD

## 2021-07-15 NOTE — Op Note (Signed)
Alison Chandler, Alison Chandler MEDICAL RECORD NO: [id number] ACCOUNT NO: [id number] DATE OF BIRTH: April 16, 1941 FACILITY: [facility] LOCATION: [facility] PHYSICIAN: Lind Guest. Ninfa Linden, MD  Operative Report   DATE OF PROCEDURE: 07/15/2021  PREOPERATIVE DIAGNOSIS:  Right knee bicondylar tibial plateau fracture.  POSTOPERATIVE DIAGNOSIS:  Right knee bicondylar tibial plateau fracture.  PROCEDURE:  Open reduction/internal fixation of right bicondylar tibial plateau fracture.  IMPLANTS:  [employer] 8-hole right proximal tibia, periarticular locking plate.  SURGEON:  Jean Rosenthal, M.D.  ASSISTANT:  Benita Stabile, PA-C.  ANESTHESIA:  Spinal.  TOURNIQUET TIME:  Under one hour.  ANTIBIOTICS:  2 g IV Ancef.  ESTIMATED BLOOD LOSS:  Less than 100 mL.  COMPLICATIONS:  None.  INDICATIONS:  The patient is an 80 year old female well known to me.  She unfortunately had an accidental mechanical fall in her home last week, landing directly on her right knee.  She was seen in the emergency room and found to have a minimally  displaced bicondylar tibial plateau fracture.  We recommended open reduction and internal fixation.  We waited until this week to allow soft tissue swelling to subside.  She understands fully why we are recommending the surgery and we had a long and  thorough discussion about the risks and benefits involved.  DESCRIPTION OF PROCEDURE:  After informed consent was obtained, appropriate right knee was marked.  She was brought to the operating room and sat up on the operating table.  Spinal anesthesia was obtained.  She was laid in supine position on the  operating table.  A nonsterile tourniquet placed on her upper right thigh and her right thigh, knee, leg, ankle and foot were prepped and draped with DuraPrep and sterile drapes including a sterile stockinette.  A timeout was called.  She was identified  as correct patient, correct right knee.  We then used Esmarch to wrap that leg and  tourniquet was set at 300 mmHg pressure.  I then made a lateral incision in the proximal tibia.  This was a curvilinear type of incision.  We dissected down to the  proximal tibia and we were able to easily divide the soft tissue.  The fracture was somewhat medial baseplate and split centrally as well. Based on CT scan imaging, we still felt the lateral plate would sufficiently allow [place] to reduce the fracture, which  it did.  We under direct fluoroscopic guidance and visualization, we chose an 8 holed proximal tibia locking plate from [facility] and nephew for right knee.  We then secured this with bicortical screws distally and a combination of locking screws and  bicortical screws proximally to completely hold the fracture reduced.  We verified this under fluoroscopic guidance in AP and lateral planes and put the knee through gentle range of motion and it moved as a unit and we stabilized the fracture.  We then  irrigated the soft tissue with normal saline solution.  We closed the deep tissue with 0 Vicryl over the plate followed by 2-0 Vicryl, subcutaneous tissue, interrupted 2-0 nylon to close the skin incision.  Xeroform, well-padded sterile dressing was  applied.  She was taken to recovery in stable condition with all final counts being correct.  No complications noted.  Of note, Benita Stabile, PA-C, assistance was crucial for facilitating all aspects of this case, especially with holding the fracture  reduced while we fixed it.  POSTOPERATIVE PLAN:  The patient will be admitted and will be nonweightbearing on the right lower extremity.   MUK  D: 07/15/2021 4:43:35 pm T: 07/15/2021 9:33:00 pm  JOB: [id number]/ [id number]

## 2021-07-15 NOTE — H&P (Signed)
Alison Chandler is an 80 y.o. female.   Chief Complaint:   Right knee pain with known tibial plateau fracture HPI: The patient is an 80 year old female well-known to me who unfortunately sustained an accidental mechanical fall last week injuring her right knee.  She was seen in emergency room and found to have a right tibial plateau fracture with displacement.  I then saw her in the office the next day and showed her the x-rays.  This is an unstable fracture and it has been recommended that she undergo open reduction/internal fixation of her right tibial plateau fracture.  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[place])    one Quit working was removed   Tremor, essential     Past Surgical History:  Procedure Laterality Date   colonscopy  01/27/2012   JOINT REPLACEMENT Right 2013   THR   KIDNEY STONE SURGERY     PARATHYROIDECTOMY N/A 05/10/2019   Procedure: PARATHYROIDECTOMY;  Surgeon: Fredirick Maudlin, MD;  Location: [facility] ORS;  Service: General;  Laterality: N/A;   TOTAL HIP ARTHROPLASTY  02/19/2012   Procedure: TOTAL HIP ARTHROPLASTY ANTERIOR APPROACH;  Surgeon: Mcarthur Rossetti, MD;  Location: [facility] ORS;  Service: Orthopedics;  Laterality: Right;    Family History  Problem Relation Age of Onset   Thyroid disease Mother    Lung cancer Mother 17   Breast cancer Mother 20   Parkinson's disease Father    Social History:  reports that she quit smoking about 49 years ago. Her smoking use included cigarettes. She has a 5.00 pack-year smoking history. She has never used smokeless tobacco. She reports that she does not currently use alcohol after a past usage of about 1.0 standard drink per week. She reports that she does not use drugs.  Allergies:  Allergies  Allergen Reactions   Actonel [Risedronate Sodium]  Nausea Only   Amlodipine Nausea Only    Medications Prior to Admission  Medication Sig Dispense Refill   acetaminophen (TYLENOL) 500 MG tablet Take 500 mg by mouth in the morning and at bedtime.     atenolol (TENORMIN) 50 MG tablet Take 50 mg by mouth every evening.      atorvastatin (LIPITOR) 40 MG tablet Take 20 mg by mouth at bedtime.     Cholecalciferol (DIALYVITE VITAMIN D 5000) 125 MCG (5000 UT) capsule Take 5,000 Units by mouth every evening.     Cyanocobalamin (VITAMIN B-12 PO) Take 2,500 mcg by mouth every evening.     GLUCOSAMINE-CHONDROITIN PO Take 1 tablet by mouth every evening.     HYDROcodone-acetaminophen (NORCO/VICODIN) 5-325 MG tablet Take 1 tablet by mouth every 6 (six) hours as needed. 15 tablet 0   Multiple Minerals-Vitamins (CAL MAG ZINC +D3) TABS Take 1 tablet by mouth daily.     naproxen sodium (ALEVE) 220 MG tablet Take 220 mg by mouth daily as needed (pain).     Omega-3 Fatty Acids (FISH OIL) 1200 MG CAPS Take 1,200 mg by mouth every evening.     Polyethyl Glycol-Propyl Glycol (SYSTANE OP) Place 1 drop into both eyes daily as needed (dry eyes).     traMADol (ULTRAM) 50 MG tablet Take 1-2 tablets (50-100 mg total) by mouth every 6 (six) hours as needed. 40 tablet 0  ondansetron (ZOFRAN ODT) 4 MG disintegrating tablet Take 1 tablet (4 mg total) by mouth every 8 (eight) hours as needed for nausea or vomiting. 20 tablet 0    No results found for this or any previous visit (from the past 48 hour(s)). No results found.  Review of Systems  Height 5\' 9"  (1.753 m), weight 99.8 kg. Physical Exam Vitals reviewed.  Constitutional:      Appearance: Normal appearance.  HENT:     Head: Normocephalic and atraumatic.  Eyes:     Extraocular Movements: Extraocular movements intact.     Pupils: Pupils are equal, round, and reactive to light.  Cardiovascular:     Rate and Rhythm: Normal rate.     Pulses: Normal pulses.  Pulmonary:     Effort: Pulmonary effort is normal.   Abdominal:     Palpations: Abdomen is soft.  Musculoskeletal:     Cervical back: Normal range of motion and neck supple.     Right knee: Swelling, effusion, ecchymosis and bony tenderness present. Decreased range of motion.  Neurological:     Mental Status: She is alert and oriented to person, place, and time.  Psychiatric:        Behavior: Behavior normal.     Assessment/Plan Right complex tibial plateau fracture with displacement  Our plan is to proceed to surgery today for open reduction/internal fixation of the right tibial plateau.  I discussed this with the patient in her office and showed her x-rays and described what the surgery involves.  We did talk about the risks and benefits of surgery and nonoperative versus operative treatment modalities.  We feel that this is the best option for her given the displaced nature of this fracture and given the fact that she is very highly mobile.  Mcarthur Rossetti, MD 07/15/2021, 1:43 PM

## 2021-07-15 NOTE — Brief Op Note (Signed)
07/15/2021  4:46 PM  PATIENT:  Alison Chandler  80 y.o. female  PRE-OPERATIVE DIAGNOSIS:  right tibial plateau fracture  POST-OPERATIVE DIAGNOSIS:  right tibial plateau fracture  PROCEDURE:  Procedure(s): OPEN REDUCTION INTERNAL FIXATION (ORIF) RIGHT TIBIAL PLATEAU (Right)  SURGEON:  Surgeon(s) and Role:    Mcarthur Rossetti, MD - Primary  PHYSICIAN ASSISTANT: Benita Stabile, PA-C  ANESTHESIA:   spinal  EBL:  20 mL   COUNTS:  YES  TOURNIQUET:  * Missing tourniquet times found for documented tourniquets in log: [id number] *  DICTATION: .Other Dictation: Dictation Number [id number]  PLAN OF CARE: Admit to inpatient   PATIENT DISPOSITION:  PACU - hemodynamically stable.   Delay start of Pharmacological VTE agent (>24hrs) due to surgical blood loss or risk of bleeding: no

## 2021-07-15 NOTE — Progress Notes (Signed)
Dr. Roanna Banning called and made aware of blood pressure trends.  230/95---> 215/78----> 222/89---> 208/74 ---> 220/95. Pulse 58-62.  Order for Hydralazine IV provided.  Pt endorse "white coat syndrome" and this typically happens in the doctor's office.  Reports at home her blood pressure normally runs no higher than 155/80.  IV Hydralazine given, monitoring pt.

## 2021-07-15 NOTE — Anesthesia Preprocedure Evaluation (Addendum)
Anesthesia Evaluation  Patient identified by MRN, date of birth, ID band Patient awake    Reviewed: Allergy & Precautions, NPO status , Patient's Chart, lab work & pertinent test results  Airway Mallampati: III  TM Distance: >3 FB Neck ROM: Full    Dental no notable dental hx.    Pulmonary former smoker,    Pulmonary exam normal breath sounds clear to auscultation       Cardiovascular hypertension, Pt. on home beta blockers Normal cardiovascular exam Rhythm:Regular Rate:Normal  ECG: rate 47   Neuro/Psych  Headaches, negative psych ROS   GI/Hepatic negative GI ROS, Neg liver ROS,   Endo/Other  negative endocrine ROS  Renal/GU negative Renal ROS     Musculoskeletal  (+) Arthritis ,   Abdominal   Peds  Hematology HLD   Anesthesia Other Findings Right tibial plateau fracture  Reproductive/Obstetrics                             Anesthesia Physical Anesthesia Plan  ASA: 2  Anesthesia Plan: Spinal   Post-op Pain Management:    Induction:   PONV Risk Score and Plan: 2 and Ondansetron, Dexamethasone, Propofol infusion and Treatment may vary due to age or medical condition  Airway Management Planned: Simple Face Mask  Additional Equipment:   Intra-op Plan:   Post-operative Plan:   Informed Consent: I have reviewed the patients History and Physical, chart, labs and discussed the procedure including the risks, benefits and alternatives for the proposed anesthesia with the patient or authorized representative who has indicated his/her understanding and acceptance.     Dental advisory given  Plan Discussed with: CRNA and Anesthesiologist  Anesthesia Plan Comments:        Anesthesia Quick Evaluation

## 2021-07-15 NOTE — Transfer of Care (Signed)
Immediate Anesthesia Transfer of Care Note  Patient: Alison Chandler  Procedure(s) Performed: OPEN REDUCTION INTERNAL FIXATION (ORIF) RIGHT TIBIAL PLATEAU (Right: Knee)  Patient Location: PACU  Anesthesia Type:Spinal  Level of Consciousness: awake, alert  and patient cooperative  Airway & Oxygen Therapy: Patient Spontanous Breathing and Patient connected to face mask oxygen  Post-op Assessment: Report given to RN and Post -op Vital signs reviewed and stable  Post vital signs: Reviewed and stable  Last Vitals:  Vitals Value Taken Time  BP 152/87   Temp    Pulse 85 07/15/21 1707  Resp 15 07/15/21 1707  SpO2 100 % 07/15/21 1707  Vitals shown include unvalidated device data.  Last Pain:  Vitals:   07/15/21 1403  TempSrc:   PainSc: 0-No pain      Patients Stated Pain Goal: 3 (10/30/47 9692)  Complications: No notable events documented.

## 2021-07-15 NOTE — Interval H&P Note (Signed)
History and Physical Interval Note: The patient understands she is here for surgical fixation of a complex right proximal tibia tibial plateau fracture.  There has been no acute or interval change in her medical status.  Please see recent H&P.  The risks and benefits of surgery been explained in detail and informed consent is obtained.  The right operative leg has been marked.  07/15/2021 2:58 PM  Alison Chandler  has presented today for surgery, with the diagnosis of right tibial plateau fracture.  The various methods of treatment have been discussed with the patient and family. After consideration of risks, benefits and other options for treatment, the patient has consented to  Procedure(s): OPEN REDUCTION INTERNAL FIXATION (ORIF) RIGHT TIBIAL PLATEAU (Right) as a surgical intervention.  The patient's history has been reviewed, patient examined, no change in status, stable for surgery.  I have reviewed the patient's chart and labs.  Questions were answered to the patient's satisfaction.     Mcarthur Rossetti

## 2021-07-16 ENCOUNTER — Encounter (HOSPITAL_COMMUNITY): Payer: Self-pay | Admitting: Orthopaedic Surgery

## 2021-07-16 LAB — GLUCOSE, CAPILLARY
Glucose-Capillary: 105 mg/dL — ABNORMAL HIGH (ref 70–99)
Glucose-Capillary: 106 mg/dL — ABNORMAL HIGH (ref 70–99)
Glucose-Capillary: 110 mg/dL — ABNORMAL HIGH (ref 70–99)

## 2021-07-16 MED ORDER — APIXABAN 2.5 MG PO TABS
2.5000 mg | ORAL_TABLET | Freq: Two times a day (BID) | ORAL | Status: DC
  Administered 2021-07-16 – 2021-07-18 (×5): 2.5 mg via ORAL
  Filled 2021-07-16 (×5): qty 1

## 2021-07-16 MED ORDER — CHLORHEXIDINE GLUCONATE CLOTH 2 % EX PADS
6.0000 | MEDICATED_PAD | Freq: Every day | CUTANEOUS | Status: DC
  Administered 2021-07-16 – 2021-07-18 (×2): 6 via TOPICAL

## 2021-07-16 NOTE — Progress Notes (Signed)
Subjective: 1 Day Post-Op Procedure(s) (LRB): OPEN REDUCTION INTERNAL FIXATION (ORIF) RIGHT TIBIAL PLATEAU (Right) Patient reports pain as moderate.  Surgery went well.  I did share with her her x-rays postoperative of the right knee and tibial plateau.  Objective: Vital signs in last 24 hours: Temp:  [97.5 F (36.4 C)-98.2 F (36.8 C)] 98.2 F (36.8 C) (11/16 0753) Pulse Rate:  [55-78] 76 (11/16 0753) Resp:  [12-20] 15 (11/16 0753) BP: (139-231)/(74-95) 154/86 (11/16 0753) SpO2:  [91 %-100 %] 92 % (11/16 0753) Weight:  [102.1 kg] 102.1 kg (11/15 1347)  Intake/Output from previous day: 11/15 0701 - 11/16 0700 In: 700 [I.V.:700] Out: 795 [Urine:775; Blood:20] Intake/Output this shift: No intake/output data recorded.  No results for input(s): HGB in the last 72 hours. No results for input(s): WBC, RBC, HCT, PLT in the last 72 hours. No results for input(s): NA, K, CL, CO2, BUN, CREATININE, GLUCOSE, CALCIUM in the last 72 hours. No results for input(s): LABPT, INR in the last 72 hours.  Sensation intact distally Intact pulses distally Dorsiflexion/Plantar flexion intact Incision: dressing C/D/I Compartment soft   Assessment/Plan: 1 Day Post-Op Procedure(s) (LRB): OPEN REDUCTION INTERNAL FIXATION (ORIF) RIGHT TIBIAL PLATEAU (Right) Up with therapy She will need to remain nonweightbearing on her right lower extremity for the next 6 weeks minimal. Will order PT and OT.  She will need to be evaluated for CIR versus short-term skilled nursing.     Alison Chandler 07/16/2021, 7:59 AM

## 2021-07-16 NOTE — Progress Notes (Signed)
? ?  Inpatient Rehab Admissions Coordinator : ? ?Per therapy recommendations patient was screened for CIR candidacy by Takoda Janowiak RN MSN. Patient does not appear to demonstrate the medical neccesity for a Hospital Rehabilitation /CIR admit. I will not place a Rehab Consult. Recommend other Rehab Venues to be pursued. Please contact me with any questions. ? ?Parker Wherley RN MSN ?Admissions Coordinator ?[phone]  ?

## 2021-07-16 NOTE — Plan of Care (Signed)

## 2021-07-16 NOTE — Progress Notes (Signed)
Physical Therapy Evaluation Patient Details Name: Alison Chandler MRN: [id number] DOB: 1940/09/17 Today's Date: 07/16/2021  History of Present Illness  80 yo female with onset of mechanical fall at home was readmitted on 11/15 for ORIF of R tibial plateau fracture.  Surgery was delayed due to surgery schedule, now referred to PT for mobility.  PMHx:  THA, kidney stones, OA, hearing loss, HLD, HTN, PN LE's, tremors, parathyroid dysfunction  Clinical Impression  Pt was seen for mobility on side of bed with help to get RLE in place, but declines to let PT assist it once the transfer to chair begins.  Pt is assisted to slide on bed pad, with locked drop arm chair and nurse in to guard the chair.  Her  tolerance to move RLE is improved by sitting up and talking her through the process.    Follow along with her to get to CIR if able to be accepted, and will in the meantime focus on independence with sliding and standing, to maintain NWB and to increase her control of RLE as well as knee ROM.      Recommendations for follow up therapy are one component of a multi-disciplinary discharge planning process, led by the attending physician.  Recommendations may be updated based on patient status, additional functional criteria and insurance authorization.  Follow Up Recommendations Acute inpatient rehab (3hours/day)    Assistance Recommended at Discharge Frequent or constant Supervision/Assistance  Functional Status Assessment Patient has had a recent decline in their functional status and demonstrates the ability to make significant improvements in function in a reasonable and predictable amount of time.  Equipment Recommendations  None recommended by PT    Recommendations for Other Services Rehab consult     Precautions / Restrictions Precautions Precautions: Fall Precaution Comments: NWB on RLE, painful L hand IV Restrictions Weight Bearing Restrictions: Yes RLE Weight Bearing: Non weight bearing       Mobility  Bed Mobility Overal bed mobility: Needs Assistance Bed Mobility: Supine to Sit;Rolling Rolling: Min assist   Supine to sit: Mod assist     General bed mobility comments: pt requires help to scoot on the bed due to R knee and leg pain    Transfers Overall transfer level: Needs assistance Equipment used: 1 person hand held assist;2 person hand held assist Transfers: Bed to chair/wheelchair/BSC            Lateral/Scoot Transfers: Mod assist;+2 physical assistance;+2 safety/equipment;From elevated surface General transfer comment: second person assisted to hold chair in place for pt to scoot with PT on bed pad    Ambulation/Gait               General Gait Details: declined due to her pain  Stairs            Wheelchair Mobility    Modified Rankin (Stroke Patients Only)       Balance Overall balance assessment: Needs assistance Sitting-balance support: Feet supported;Bilateral upper extremity supported Sitting balance-Leahy Scale: Fair Sitting balance - Comments: fair with LUE in pain over IV                                     Pertinent Vitals/Pain Pain Assessment: Faces Faces Pain Scale: Hurts even more Pain Location: R knee post op Pain Descriptors / Indicators: Grimacing;Guarding Pain Intervention(s): Monitored during session;Limited activity within patient's tolerance;Premedicated before session;Repositioned    Home Living Family/patient  expects to be discharged to:: Private residence Living Arrangements: Spouse/significant other Available Help at Discharge: Family;Available 24 hours/day Type of Home: House Home Access: Stairs to enter Entrance Stairs-Rails: None Entrance Stairs-Number of Steps: 1   Home Layout: One level Home Equipment: Conservation officer, nature (2 wheels);Wheelchair - manual;BSC/3in1 [facility] has no drop arm) Additional Comments: will be better with drop arm commode for safety    Prior Function Prior  Level of Function : Independent/Modified Independent             Mobility Comments: caught toe on carpet, no AD used ADLs Comments: I prior to the fall     Hand Dominance   Dominant Hand: Right    Extremity/Trunk Assessment   Upper Extremity Assessment Upper Extremity Assessment: LUE deficits/detail LUE Deficits / Details: IV painful on hand    Lower Extremity Assessment Lower Extremity Assessment: RLE deficits/detail RLE Deficits / Details: pt is in ace wrap post op and has minimal flexion tolerance, painful to touch RLE Coordination: decreased gross motor    Cervical / Trunk Assessment Cervical / Trunk Assessment: Kyphotic (mild)  Communication   Communication: No difficulties  Cognition Arousal/Alertness: Awake/alert Behavior During Therapy: WFL for tasks assessed/performed;Anxious Overall Cognitive Status: Within Functional Limits for tasks assessed                                 General Comments: pt is concerned about having to use L hand with IV in place, painful for WB even with hand in a fist        General Comments General comments (skin integrity, edema, etc.): Pt was assisted on bed pad to scoot to chair wiht good success, short transition and needed no help to maintaoin NWB on RLE    Exercises     Assessment/Plan    PT Assessment Patient needs continued PT services  PT Problem List Decreased strength;Decreased range of motion;Decreased activity tolerance;Decreased balance;Decreased mobility;Decreased coordination;Decreased knowledge of use of DME;Decreased safety awareness;Decreased skin integrity;Pain       PT Treatment Interventions DME instruction;Gait training;Functional mobility training;Therapeutic activities;Therapeutic exercise;Balance training;Neuromuscular re-education;Patient/family education;Stair training    PT Goals (Current goals can be found in the Care Plan section)  Acute Rehab PT Goals Patient Stated Goal: to walk  and get her independence back PT Goal Formulation: With patient Time For Goal Achievement: 07/30/21 Potential to Achieve Goals: Good    Frequency Min 5X/week   Barriers to discharge Inaccessible home environment;Decreased caregiver support home with husband and has a stair to enter    Co-evaluation               AM-PAC PT "6 Clicks" Mobility  Outcome Measure Help needed turning from your back to your side while in a flat bed without using bedrails?: A Little Help needed moving from lying on your back to sitting on the side of a flat bed without using bedrails?: A Lot Help needed moving to and from a bed to a chair (including a wheelchair)?: A Lot Help needed standing up from a chair using your arms (e.g., wheelchair or bedside chair)?: Total Help needed to walk in hospital room?: Total Help needed climbing 3-5 steps with a railing? : Total 6 Click Score: 10    End of Session Equipment Utilized During Treatment: Gait belt Activity Tolerance: Patient tolerated treatment well;Patient limited by pain;Treatment limited secondary to medical complications (Comment) Patient left: in chair;with call bell/phone within reach;with nursing/sitter  in room Nurse Communication: Mobility status PT Visit Diagnosis: Pain;Difficulty in walking, not elsewhere classified (R26.2);History of falling (Z91.81);Other abnormalities of gait and mobility (R26.89) Pain - Right/Left: Right Pain - part of body: Knee    Time: 1135-1202 PT Time Calculation (min) (ACUTE ONLY): 27 min   Charges:   PT Evaluation $PT Eval Moderate Complexity: 1 Mod PT Treatments $Therapeutic Activity: 8-22 mins       Ramond Dial 07/16/2021, 2:39 PM  Mee Hives, PT PhD Acute Rehab Dept. Number: [place] [facility] [place]

## 2021-07-16 NOTE — Evaluation (Signed)
Occupational Therapy Evaluation Patient Details Name: Alison Chandler MRN: [id number] DOB: 1941-03-24 Today's Date: 07/16/2021   History of Present Illness 80 yo female with onset of mechanical fall at home was readmitted on 11/15 for ORIF of R tibial plateau fracture.  Surgery was delayed due to surgery schedule, now referred to PT for mobility.  PMHx:  THA, kidney stones, OA, hearing loss, HLD, HTN, PN LE's, tremors, parathyroid dysfunction   Clinical Impression   Patient admitted for the diagnosis and subsequent procedure above.  PTA she lives with her spouse, who is able to assist as needed.  The patient did not need any assist with ADL/IADL, of mobility at home.  Deficits impacting independence are listed below.  Currently she is needing up to +2 for lateral scoot transfers to the left, and up to Max A for lower body ADL at bed level.Given the level of physical assist she needs, and the additional education and practice to understand her NWB status, SNF is recommended for post acute rehab prior to returning home.         Recommendations for follow up therapy are one component of a multi-disciplinary discharge planning process, led by the attending physician.  Recommendations may be updated based on patient status, additional functional criteria and insurance authorization.   Follow Up Recommendations  Skilled nursing-short term rehab (<3 hours/day)    Assistance Recommended at Discharge Frequent or constant Supervision/Assistance  Functional Status Assessment  Patient has had a recent decline in their functional status and demonstrates the ability to make significant improvements in function in a reasonable and predictable amount of time.  Equipment Recommendations  Wheelchair (20 widex18");Wheelchair cushion [phone]" gel foam);Other (comment) (Bariatric drop arm bedside commode) patient will need the increased surface area for safety during transfers.     Recommendations for Other  Services       Precautions / Restrictions Precautions Precaution Comments: NWB on RLE, painful L hand IV Restrictions Weight Bearing Restrictions: Yes RLE Weight Bearing: Non weight bearing Other Position/Activity Restrictions: body habitus      Mobility Bed Mobility Overal bed mobility: Needs Assistance             General bed mobility comments: up in recliner    Transfers                          Balance Overall balance assessment: Needs assistance Sitting-balance support: Feet supported;Bilateral upper extremity supported Sitting balance-Leahy Scale: Fair                                     ADL either performed or assessed with clinical judgement   ADL       Grooming: Wash/dry hands;Wash/dry face;Set up;Bed level       Lower Body Bathing: Bed level;Maximal assistance       Lower Body Dressing: Maximal assistance;Bed level   Toilet Transfer: +2 for safety/equipment;Cueing for safety;Cueing for sequencing Toilet Transfer Details (indicate cue type and reason): lateral transfer                 Vision Baseline Vision/History: 1 Wears glasses Patient Visual Report: No change from baseline       Perception Perception Perception: Not tested   Praxis Praxis Praxis: Not tested    Pertinent Vitals/Pain Pain Assessment: Faces Faces Pain Scale: Hurts little more Pain Location: R knee post op Pain Descriptors /  Indicators: Grimacing;Guarding Pain Intervention(s): Monitored during session     Hand Dominance Right   Extremity/Trunk Assessment Upper Extremity Assessment Upper Extremity Assessment: Overall WFL for tasks assessed LUE Deficits / Details: IV painful on hand   Lower Extremity Assessment Lower Extremity Assessment: Defer to PT evaluation   Cervical / Trunk Assessment Cervical / Trunk Assessment: Kyphotic   Communication Communication Communication: No difficulties   Cognition Arousal/Alertness:  Awake/alert Behavior During Therapy: WFL for tasks assessed/performed;Anxious Overall Cognitive Status: Within Functional Limits for tasks assessed                                                        Home Living Family/patient expects to be discharged to:: Private residence Living Arrangements: Spouse/significant other Available Help at Discharge: Family;Available 24 hours/day Type of Home: House Home Access: Stairs to enter [employer] of Steps: 1 Entrance Stairs-Rails: None Home Layout: One level     Bathroom Shower/Tub: Occupational psychologist: Handicapped height Bathroom Accessibility: Yes How Accessible: Accessible via walker Home Equipment: Clarence (2 wheels);Wheelchair - manual;BSC/3in1;Adaptive equipment;Shower seat - built in [employer]: Reacher;Long-handled Conservation officer, historic buildings        Prior Functioning/Environment Prior Level of Function : Independent/Modified Independent             Mobility Comments: Ind prior to the fall ADLs Comments: Ind prior to the fall        OT Problem List: Decreased strength;Decreased activity tolerance;Impaired balance (sitting and/or standing);Decreased knowledge of use of DME or AE;Pain      OT Treatment/Interventions: Self-care/ADL training;Therapeutic exercise;Therapeutic activities;DME and/or AE instruction;Patient/family education;Balance training    OT Goals(Current goals can be found in the care plan section) Acute Rehab OT Goals Patient Stated Goal: Go to rehab OT Goal Formulation: With patient Time For Goal Achievement: 07/30/21 Potential to Achieve Goals: Good ADL Goals Pt Will Perform Grooming: with set-up;sitting Pt Will Perform Lower Body Bathing: with min assist;sitting/lateral leans Pt Will Perform Lower Body Dressing: with min assist;sitting/lateral leans Pt Will Transfer to Toilet: with min guard assist;bedside commode Pt Will Perform Toileting - Clothing  Manipulation and hygiene: with min guard assist;sitting/lateral leans Pt/caregiver will Perform Home Exercise Program: Increased strength;Both right and left upper extremity;With theraband;With written HEP provided  OT Frequency: Min 2X/week   Barriers to D/C: Decreased caregiver support          Co-evaluation              AM-PAC OT "6 Clicks" Daily Activity     Outcome Measure Help from another person eating meals?: None Help from another person taking care of personal grooming?: None Help from another person toileting, which includes using toliet, bedpan, or urinal?: A Lot Help from another person bathing (including washing, rinsing, drying)?: A Lot Help from another person to put on and taking off regular upper body clothing?: A Little Help from another person to put on and taking off regular lower body clothing?: A Lot 6 Click Score: 17   End of Session    Activity Tolerance: Patient tolerated treatment well Patient left: in chair;with call bell/phone within reach  OT Visit Diagnosis: Unsteadiness on feet (R26.81);Muscle weakness (generalized) (M62.81);Pain Pain - Right/Left: Left Pain - part of body: Leg  Time: 1635-1700 OT Time Calculation (min): 25 min Charges:  OT General Charges $OT Visit: 1 Visit OT Evaluation $OT Eval Moderate Complexity: 1 Mod OT Treatments $Self Care/Home Management : 8-22 mins  07/16/2021  RP, OTR/L  Acute Rehabilitation Services  Office:  [phone]   Metta Clines 07/16/2021, 5:15 PM

## 2021-07-17 NOTE — NC FL2 (Signed)
[place] LEVEL OF CARE SCREENING TOOL     IDENTIFICATION  Patient Name: Alison Chandler Birthdate: Jan 25, 1941 Sex: female Admission Date (Current Location): 07/15/2021  [facility] and [place] Number:  Herbalist and Address:  [facility] [place], [place] [address] [place], [place] [address]      Provider Number: [id number]  Attending Physician Name and Address:  Mcarthur Rossetti,*  Relative Name and Phone Number:  [place], [place] [phone]  [phone]    Current Level of Care: Hospital Recommended Level of Care: [place] Prior Approval Number:    Date Approved/Denied:   PASRR Number: [id number]  Discharge Plan: SNF    Current Diagnoses: Patient Active Problem List   Diagnosis Date Noted   Fracture of right tibial plateau 07/15/2021   Closed fracture of right tibial plateau, initial encounter 07/15/2021   Unilateral primary osteoarthritis, left knee 12/19/2019   Benign essential hypertension 10/30/2019   Hypercalcemia 10/30/2019   Acute medial meniscal tear, right, initial encounter 08/16/2019   Iatrogenic hypocalcemia 06/05/2019   Primary hyperparathyroidism ([place])    Nuclear sclerotic cataract of both eyes 10/05/2018   RPE mottling of macula 10/05/2018   Flat foot 06/20/2018   Tibialis posterior tendonitis, left 06/20/2018   Acute pain of right shoulder 05/04/2018   Chronic pain of left knee 12/22/2017   Subjective memory complaints 10/08/2017   It band syndrome, left 08/11/2017   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[place]) 03/23/2017   Meningioma ([place]) 03/23/2017   Idiopathic peripheral neuropathy 03/11/2017   Imbalance 03/11/2017   Paresthesias 12/06/2016   Tremor 12/06/2016   Age-related osteoporosis without current pathological fracture 12/03/2016   Nephrolithiasis 12/03/2016   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/21/2016   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   Hypertropia of left eye 09/05/2014   Intermittent  exotropia of left eye 09/05/2014   Midline cystocele 05/10/2014   Stress bladder incontinence, female 05/10/2014   Benign neoplasm of colon 04/16/2014   Calculus of ureter 04/16/2014   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   Esophageal reflux 04/16/2014   Gross hematuria 04/16/2014   Headache 04/16/2014   Hypothyroidism 04/16/2014   Increased urinary frequency 04/16/2014   Keratoconjunctivitis sicca, 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 04/16/2014   Osteopenia, 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   Other seborrheic keratosis 04/16/2014   Other synovitis and tenosynovitis 04/16/2014   Presbycusis 04/16/2014   Primary localized osteoarthrosis, pelvic region and thigh 04/16/2014   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   Strabismus 04/16/2014   Urethral stenosis 04/16/2014   Urinary tract infection 04/16/2014   Allergic rhinitis 04/16/2014   Degenerative arthritis of hip 02/19/2012    Orientation RESPIRATION BLADDER Height & Weight     Self, Time, Situation, Place  Normal Continent Weight: 225 lb (102.1 kg) Height:  5\' 9"  (175.3 cm)  BEHAVIORAL SYMPTOMS/MOOD NEUROLOGICAL BOWEL NUTRITION STATUS      Continent Diet (see discharge summary)  AMBULATORY STATUS COMMUNICATION OF NEEDS Skin   Total Care Verbally Surgical wounds                       Personal Care Assistance Level of Assistance  Bathing, Feeding, Dressing Bathing Assistance: Maximum assistance Feeding assistance: Limited assistance Dressing Assistance: Maximum assistance     Functional Limitations Info  Sight, Hearing, Speech Sight Info: Adequate Hearing Info: Adequate Speech Info: Adequate    SPECIAL CARE FACTORS FREQUENCY  PT (By licensed PT), OT (By licensed OT)     PT Frequency: 5x week OT Frequency:  5x week            Contractures Contractures Info: Not present    Additional Factors Info  Code Status, Allergies Code Status Info: full Allergies Info: Actonel (Risedronate Sodium), Amlodipine           Current  Medications (07/17/2021):  This is the current hospital active medication list Current Facility-Administered Medications  Medication Dose Route Frequency Provider Last Rate Last Admin   0.9 %  sodium chloride infusion   Intravenous Continuous Mcarthur Rossetti, MD 75 mL/hr at 07/17/21 0033 New Bag at 07/17/21 0033   acetaminophen (TYLENOL) tablet 325-650 mg  325-650 mg Oral Q6H PRN Mcarthur Rossetti, MD   650 mg at 07/17/21 1602   apixaban (ELIQUIS) tablet 2.5 mg  2.5 mg Oral BID Mcarthur Rossetti, MD   2.5 mg at 07/17/21 0857   atenolol (TENORMIN) tablet 50 mg  50 mg Oral QPM Mcarthur Rossetti, MD   50 mg at 07/17/21 1602   atorvastatin (LIPITOR) tablet 20 mg  20 mg Oral QHS Mcarthur Rossetti, MD   20 mg at 07/16/21 2152   Chlorhexidine Gluconate Cloth 2 % PADS 6 each  6 each Topical Daily Mcarthur Rossetti, MD   6 each at 07/16/21 1201   diphenhydrAMINE (BENADRYL) 12.5 MG/5ML elixir 12.5-25 mg  12.5-25 mg Oral Q4H PRN Mcarthur Rossetti, MD       docusate sodium (COLACE) capsule 100 mg  100 mg Oral BID Mcarthur Rossetti, MD   100 mg at 07/17/21 0857   HYDROcodone-acetaminophen (NORCO) 7.5-325 MG per tablet 1-2 tablet  1-2 tablet Oral Q4H PRN Mcarthur Rossetti, MD   2 tablet at 07/16/21 2152   HYDROcodone-acetaminophen (NORCO/VICODIN) 5-325 MG per tablet 1-2 tablet  1-2 tablet Oral Q4H PRN Mcarthur Rossetti, MD   1 tablet at 07/17/21 1020   methocarbamol (ROBAXIN) tablet 500 mg  500 mg Oral Q6H PRN Mcarthur Rossetti, MD   500 mg at 07/15/21 1823   Or   methocarbamol (ROBAXIN) 500 mg in dextrose 5 % 50 mL IVPB  500 mg Intravenous Q6H PRN Mcarthur Rossetti, MD       metoCLOPramide (REGLAN) tablet 5-10 mg  5-10 mg Oral Q8H PRN Mcarthur Rossetti, MD       Or   metoCLOPramide (REGLAN) injection 5-10 mg  5-10 mg Intravenous Q8H PRN Mcarthur Rossetti, MD       morphine 2 MG/ML injection 0.5-1 mg  0.5-1 mg Intravenous Q2H  PRN Mcarthur Rossetti, MD   1 mg at 07/16/21 0208   ondansetron (ZOFRAN) tablet 4 mg  4 mg Oral Q6H PRN Mcarthur Rossetti, MD   4 mg at 07/15/21 2057   Or   ondansetron (ZOFRAN) injection 4 mg  4 mg Intravenous Q6H PRN Mcarthur Rossetti, MD         Discharge Medications: Please see discharge summary for a list of discharge medications.  Relevant Imaging Results:  Relevant Lab Results:   Additional Information SSN: [id number].  Pt is vaccinated for covid with one booster.  Joanne Chars, LCSW

## 2021-07-17 NOTE — Progress Notes (Signed)
Patient ID: Alison Chandler, female   DOB: 12/03/1940, 80 y.o.   MRN: [id number] The patient is now into her second day status post fixation of a right proximal tibia/tibial plateau fracture.  Therapy work with her briefly yesterday.  They decided right away that she was not a candidate for CIR.  She would not be safe to go home so skilled nursing short-term has been recommended.  I did put in an order last evening for consultation with the transitional care team for short-term SNF placement.  She has not been seen today as of yet by therapy or social work.  Her right operative knee is stable.  The dressing has just scant bloody drainage.  She is able to flex and extend her right foot and her right calf is soft.  Her vital signs are stable.  Her disposition will now be per the transitional care team.  She needs an FL 2 for me to sign and options for short-term skilled nursing.

## 2021-07-17 NOTE — TOC Initial Note (Signed)
Transition of Care [facility]) - Initial/Assessment Note    Patient Details  Name: Alison Chandler MRN: [id number] Date of Birth: [birth year]  [facility] CM/SW Contact:    Joanne Chars, LCSW Phone Number: 07/17/2021, 4:30 PM  Clinical Narrative:    CSW met with pt regarding recommendation for SNF.  Pt agreeable, choice document given, permission given to send out referral in hub.  From [facility], [facility] and [facility] would be top choices.  Permission given to speak with husband Mikeal Hawthorne.  Pt is vaccinated for covid with one booster.  PCP in place.  Current DME in home: walker, wheelchair, bedside commode. (Pt reports side does not drop on bedside commode)              Expected Discharge Plan: [place] Barriers to Discharge: Continued Medical Work up, SNF Pending bed offer   Patient Goals and CMS Choice Patient states their goals for this hospitalization and ongoing recovery are:: walking again CMS Medicare.gov Compare Post Acute Care list provided to:: Patient Choice offered to / list presented to : Patient  Expected Discharge Plan and Services Expected Discharge Plan: [place] Choice: [place] arrangements for the past 2 months: Single Family Home                                      Prior Living Arrangements/Services Living arrangements for the past 2 months: Single Family Home Lives with:: Spouse Patient language and need for interpreter reviewed:: Yes Do you feel safe going back to the place where you live?: Yes      Need for Family Participation in Patient Care: Yes (Comment) Care giver support system in place?: Yes (comment) Current home services: Other (comment) (none) Criminal Activity/Legal Involvement Pertinent to Current Situation/Hospitalization: No - Comment as needed  Activities of Daily Living Home Assistive Devices/Equipment: Eyeglasses ADL Screening  (condition at time of admission) Patient's cognitive ability adequate to safely complete daily activities?: Yes Is the patient deaf or have difficulty hearing?: No Does the patient have difficulty seeing, even when wearing glasses/contacts?: No Does the patient have difficulty concentrating, remembering, or making decisions?: No Patient able to express need for assistance with ADLs?: Yes Does the patient have difficulty dressing or bathing?: No Independently performs ADLs?: Yes (appropriate for developmental age) Does the patient have difficulty walking or climbing stairs?: Yes Weakness of Legs: Right Weakness of Arms/Hands: None  Permission Sought/Granted Permission sought to share information with : Family Supports Permission granted to share information with : Yes, Verbal Permission Granted  Share Information with NAME: husband Mikeal Hawthorne  Permission granted to share info w AGENCY: SNF        Emotional Assessment Appearance:: Appears stated age Attitude/Demeanor/Rapport: Engaged Affect (typically observed): Appropriate, Pleasant Orientation: : Oriented to Self, Oriented to Place, Oriented to  Time, Oriented to Situation Alcohol / Substance Use: Not Applicable Psych Involvement: No (comment)  Admission diagnosis:  Closed fracture of right tibial plateau, initial encounter [S82.141A] Patient Active Problem List   Diagnosis Date Noted   Fracture of right tibial plateau 07/15/2021   Closed fracture of right tibial plateau, initial encounter 07/15/2021   Unilateral primary osteoarthritis, left knee 12/19/2019   Benign essential hypertension 10/30/2019   Hypercalcemia 10/30/2019   Acute medial meniscal tear, right, initial encounter 08/16/2019   Iatrogenic hypocalcemia 06/05/2019   Primary  hyperparathyroidism ([place])    Nuclear sclerotic cataract of both eyes 10/05/2018   RPE mottling of macula 10/05/2018   Flat foot 06/20/2018   Tibialis posterior tendonitis, left 06/20/2018    Acute pain of right shoulder 05/04/2018   Chronic pain of left knee 12/22/2017   Subjective memory complaints 10/08/2017   It band syndrome, left 08/11/2017   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[place]) 03/23/2017   Meningioma ([place]) 03/23/2017   Idiopathic peripheral neuropathy 03/11/2017   Imbalance 03/11/2017   Paresthesias 12/06/2016   Tremor 12/06/2016   Age-related osteoporosis without current pathological fracture 12/03/2016   Nephrolithiasis 12/03/2016   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/21/2016   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   Hypertropia of left eye 09/05/2014   Intermittent exotropia of left eye 09/05/2014   Midline cystocele 05/10/2014   Stress bladder incontinence, female 05/10/2014   Benign neoplasm of colon 04/16/2014   Calculus of ureter 04/16/2014   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   Esophageal reflux 04/16/2014   Gross hematuria 04/16/2014   Headache 04/16/2014   Hypothyroidism 04/16/2014   Increased urinary frequency 04/16/2014   Keratoconjunctivitis sicca, 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 04/16/2014   Osteopenia, 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   Other seborrheic keratosis 04/16/2014   Other synovitis and tenosynovitis 04/16/2014   Presbycusis 04/16/2014   Primary localized osteoarthrosis, pelvic region and thigh 04/16/2014   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   Strabismus 04/16/2014   Urethral stenosis 04/16/2014   Urinary tract infection 04/16/2014   Allergic rhinitis 04/16/2014   Degenerative arthritis of hip 02/19/2012   PCP:  Verdell Carmine., MD Pharmacy:   [facility] [id number] - [place], [place] [address] [place] [address] Phone: [phone] Fax: [id number] - [place], [place] - [address] AT [place] [address] [place] [address] Phone: [phone] Fax: [phone]     Social Determinants of Health (SDOH) Interventions    Readmission Risk  Interventions No flowsheet data found.

## 2021-07-17 NOTE — Progress Notes (Signed)
Physical Therapy Treatment Patient Details Name: Alison Chandler MRN: [id number] DOB: 08/22/1941 Today's Date: 07/17/2021   History of Present Illness 80 yo female with onset of mechanical fall at home was readmitted on 11/15 for ORIF of R tibial plateau fracture.  Surgery was delayed due to surgery schedule, now referred to PT for mobility.  PMHx:  THA, kidney stones, OA, hearing loss, HLD, HTN, PN LE's, tremors, parathyroid dysfunction    PT Comments    Pt reports feeling drowsy secondary to pain medication, but motivated to participate in PT. Pt requiring min-mod assist for progression OOB to BSC, then recliner. Pt would benefit from drop arm BSC for ease of transfer, pt has difficulty clearing buttocks over armrest and maintaining NWB precautions. RN notified of request. PT to continue to follow, plan updated to reflect SNF discharge.    Recommendations for follow up therapy are one component of a multi-disciplinary discharge planning process, led by the attending physician.  Recommendations may be updated based on patient status, additional functional criteria and insurance authorization.  Follow Up Recommendations  Skilled nursing-short term rehab (<3 hours/day)     Assistance Recommended at Discharge Frequent or constant Supervision/Assistance  Equipment Recommendations  None recommended by PT    Recommendations for Other Services       Precautions / Restrictions Precautions Precautions: Fall Restrictions Weight Bearing Restrictions: Yes RLE Weight Bearing: Non weight bearing     Mobility  Bed Mobility Overal bed mobility: Needs Assistance Bed Mobility: Supine to Sit     Supine to sit: Min assist;HOB elevated     General bed mobility comments: min assist for RLE management, pt using bedrails and HOB elevation to elevate trunk.    Transfers Overall transfer level: Needs assistance Equipment used: 1 person hand held assist Transfers: Bed to chair/wheelchair/BSC      Squat pivot transfers: Mod assist       General transfer comment: Mod assist for power up, hip translation to [facility] then drop arm recliner, max cuing and hand placement for safe transfer. Transfer towards L x2.    Ambulation/Gait               General Gait Details: nt   Marine scientist Rankin (Stroke Patients Only)       Balance Overall balance assessment: Needs assistance Sitting-balance support: Feet supported;Bilateral upper extremity supported Sitting balance-Leahy Scale: Fair                                      Cognition Arousal/Alertness: Awake/alert Behavior During Therapy: WFL for tasks assessed/performed;Anxious Overall Cognitive Status: Within Functional Limits for tasks assessed                                 General Comments: requires frequent safety cues        Exercises General Exercises - Lower Extremity Ankle Circles/Pumps: AROM;Both;10 reps;Seated Quad Sets: AROM;Right;10 reps;Seated Long Arc Quad: AAROM;Right;10 reps;Seated (within pt's comfortable ROM)    General Comments        Pertinent Vitals/Pain Pain Assessment: Faces Faces Pain Scale: Hurts little more Pain Location: RLE Pain Descriptors / Indicators: Grimacing;Guarding Pain Intervention(s): Limited activity within patient's tolerance;Monitored during session;Repositioned    Home Living  Prior Function            PT Goals (current goals can now be found in the care plan section) Acute Rehab PT Goals Patient Stated Goal: to walk and get her independence back PT Goal Formulation: With patient Time For Goal Achievement: 07/30/21 Potential to Achieve Goals: Good Progress towards PT goals: Progressing toward goals    Frequency    Min 3X/week      PT Plan Current plan remains appropriate    Co-evaluation              AM-PAC PT "6 Clicks" Mobility    Outcome Measure  Help needed turning from your back to your side while in a flat bed without using bedrails?: A Little Help needed moving from lying on your back to sitting on the side of a flat bed without using bedrails?: A Little Help needed moving to and from a bed to a chair (including a wheelchair)?: A Lot Help needed standing up from a chair using your arms (e.g., wheelchair or bedside chair)?: A Lot Help needed to walk in hospital room?: Total Help needed climbing 3-5 steps with a railing? : Total 6 Click Score: 12    End of Session Equipment Utilized During Treatment: Gait belt Activity Tolerance: Patient tolerated treatment well;Patient limited by pain;Treatment limited secondary to medical complications (Comment) Patient left: in chair;with call bell/phone within reach;Other (comment) (pt verbalizes she will press call button and wait for assist prior to mobilizing back to bed) Nurse Communication: Mobility status PT Visit Diagnosis: Pain;Difficulty in walking, not elsewhere classified (R26.2);History of falling (Z91.81);Other abnormalities of gait and mobility (R26.89) Pain - Right/Left: Right Pain - part of body: Knee     Time: 1128-1150 PT Time Calculation (min) (ACUTE ONLY): 22 min  Charges:  $Therapeutic Activity: 8-22 mins                    Stacie Glaze, PT DPT Acute Rehabilitation Services Pager [phone]  Office [phone]    Quinnlan Abruzzo E Ruffin Pyo 07/17/2021, 12:03 PM

## 2021-07-17 NOTE — Anesthesia Postprocedure Evaluation (Addendum)
Anesthesia Post Note  Patient: SHIVANGI LUTZ  Procedure(s) Performed: OPEN REDUCTION INTERNAL FIXATION (ORIF) RIGHT TIBIAL PLATEAU (Right: Knee)     Patient location during evaluation: PACU Anesthesia Type: Spinal Level of consciousness: patient cooperative and awake Pain management: pain level controlled Vital Signs Assessment: post-procedure vital signs reviewed and stable Respiratory status: spontaneous breathing, nonlabored ventilation, respiratory function stable and patient connected to nasal cannula oxygen Cardiovascular status: stable and blood pressure returned to baseline Postop Assessment: no apparent nausea or vomiting Anesthetic complications: no   No notable events documented.  Last Vitals:  Vitals:   07/17/21 0522 07/17/21 0748  BP: (!) 152/82 (!) 157/72  Pulse: 86 83  Resp: 16 16  Temp: 36.8 C 36.6 C  SpO2: 93% 98%    Last Pain:  Vitals:   07/17/21 1105  TempSrc:   PainSc: 2                  Toluwani Ruder

## 2021-07-18 MED ORDER — METHOCARBAMOL 500 MG PO TABS: 500.0000 mg | ORAL_TABLET | Freq: Four times a day (QID) | ORAL | 0 refills | Status: DC | PRN

## 2021-07-18 MED ORDER — HYDROCODONE-ACETAMINOPHEN 5-325 MG PO TABS: 1.0000 | ORAL_TABLET | ORAL | 0 refills | Status: DC | PRN

## 2021-07-18 MED ORDER — APIXABAN 2.5 MG PO TABS: 2.5000 mg | ORAL_TABLET | Freq: Two times a day (BID) | ORAL | 0 refills | Status: DC

## 2021-07-18 NOTE — Plan of Care (Signed)

## 2021-07-18 NOTE — TOC Transition Note (Signed)
Transition of Care [facility]) - CM/SW Discharge Note   Patient Details  Name: Alison Chandler MRN: [id number] Date of Birth: 09-09-1940  [facility]) CM/SW Contact:  Milinda Antis, [place] Phone Number: 07/18/2021, 2:18 PM   Clinical Narrative:     Patient will DC to:  [facility] Anticipated DC date: 07/18/2021 Family notified: Yes Transport by:  Corey Harold   Per MD patient ready for DC to SNF. RN to call report prior to discharge [phone]. RN, patient, patient's family, and facility notified of DC. Discharge Summary and FL2 sent to facility. DC packet on chart. Ambulance transport requested for patient.   CSW will sign off for now as social work intervention is no longer needed. Please consult [place] again if new needs arise.      Barriers to Discharge: Continued Medical Work up, SNF Pending bed offer   Patient Goals and CMS Choice Patient states their goals for this hospitalization and ongoing recovery are:: walking again CMS Medicare.gov Compare Post Acute Care list provided to:: Patient Choice offered to / list presented to : Patient  Discharge Placement                       Discharge Plan and Services     Post Acute Care Choice: [place]                               Social Determinants of Health (SDOH) Interventions     Readmission Risk Interventions No flowsheet data found.

## 2021-07-18 NOTE — Progress Notes (Signed)
Patient ID: Alison Chandler, female   DOB: 05/01/1941, 80 y.o.   MRN: [id number] No acute changes.  Right operative leg stable.  Will reman NWB on right LE for 6 weeks minimum.  Transitional Care team working on short-term SNF placement. Can be discharged from Ortho standpoint when bed available.

## 2021-07-18 NOTE — Progress Notes (Signed)
Occupational Therapy Treatment Patient Details Name: Alison Chandler MRN: [id number] DOB: 1941/08/14 Today's Date: 07/18/2021   History of present illness 80 yo female with onset of mechanical fall at home was readmitted on 11/15 for ORIF of R tibial plateau fracture.  Surgery was delayed due to surgery schedule, now referred to PT for mobility.  PMHx:  THA, kidney stones, OA, hearing loss, HLD, HTN, PN LE's, tremors, parathyroid dysfunction   OT comments  Pt. Seen for skilled OT treatment.  Initially reports feeling nervous of falling but agreeable to 3n1 drop arm commode transfer.  Able to transfer to/from bsc for toileting task.  Min a and cues for sequencing.  Visibly pleased with her accomplishment and agreeable to complete transfer to 3n1 vs. Pure wik during the day to progress safety with mobility and adls.     Recommendations for follow up therapy are one component of a multi-disciplinary discharge planning process, led by the attending physician.  Recommendations may be updated based on patient status, additional functional criteria and insurance authorization.    Follow Up Recommendations  Skilled nursing-short term rehab (<3 hours/day)    Assistance Recommended at Discharge Frequent or constant Supervision/Assistance  Equipment Recommendations  Wheelchair (measurements OT);Wheelchair cushion (measurements OT);Other (comment)    Recommendations for Other Services      Precautions / Restrictions Precautions Precautions: Fall Precaution Comments: NWB on RLE, painful L hand IV       Mobility Bed Mobility               General bed mobility comments: seated in chair at beginning and end of session    Transfers Overall transfer level: Needs assistance Equipment used: 1 person hand held assist Transfers: Bed to chair/wheelchair/BSC            Lateral/Scoot Transfers: Min assist General transfer comment: able to complete transfer by dropping arm rest on recliner and  utilizing drop arm commode and transfering back and forth after use. min a and cues for sequencing and hand placement.     Balance                                           ADL either performed or assessed with clinical judgement   ADL Overall ADL's : Needs assistance/impaired                         Toilet Transfer: Minimal assistance;Cueing for sequencing;Cueing for safety;BSC/3in1;Requires drop arm Toilet Transfer Details (indicate cue type and reason): dropped arm on recliner, and also on drop arm commode. pt. able to transfer to L to get on drop arm commode and then transfer back to R.  agreeable to leave pure wik out duirng the day and utilize drop arm to increase strength and function Toileting- Clothing Manipulation and Hygiene: Set up;Sitting/lateral lean       Functional mobility during ADLs: Minimal assistance General ADL Comments: pt. initially hesitant to complete the transfer but was visibly pleased when completed it to/from drop arm.  also educated in chair push ups for cont. b ue and LLE strengthening to aide in safe mobility during NWB of  RLE    Extremity/Trunk Assessment              Vision       Perception     Praxis      Cognition  Arousal/Alertness: Awake/alert Behavior During Therapy: WFL for tasks assessed/performed;Anxious Overall Cognitive Status: Within Functional Limits for tasks assessed                                 General Comments: requires frequent safety cues, reported feeling "a little overwhelmed" with her emotions as they relate to her current situation.  provided emotional support and encouraged participation to aide in her feeling better. she said at end of session she did feel better having participated :)          Exercises     Shoulder Instructions       General Comments      Pertinent Vitals/ Pain       Pain Assessment: No/denies pain  Home Living                                           Prior Functioning/Environment              Frequency  Min 2X/week        Progress Toward Goals  OT Goals(current goals can now be found in the care plan section)  Progress towards OT goals: Progressing toward goals     Plan      Co-evaluation                 AM-PAC OT "6 Clicks" Daily Activity     Outcome Measure   Help from another person eating meals?: None Help from another person taking care of personal grooming?: None Help from another person toileting, which includes using toliet, bedpan, or urinal?: A Lot Help from another person bathing (including washing, rinsing, drying)?: A Lot Help from another person to put on and taking off regular upper body clothing?: A Little Help from another person to put on and taking off regular lower body clothing?: A Lot 6 Click Score: 17    End of Session    OT Visit Diagnosis: Unsteadiness on feet (R26.81);Muscle weakness (generalized) (M62.81);Pain Pain - Right/Left: Right Pain - part of body: Leg   Activity Tolerance Patient tolerated treatment well   Patient Left in chair;with call bell/phone within reach   Nurse Communication          Time: 4982-6415 OT Time Calculation (min): 17 min  Charges: OT General Charges $OT Visit: 1 Visit OT Treatments $Self Care/Home Management : 8-22 mins  Sonia Baller, COTA/L Acute Rehabilitation [phone]   Tanya Nones 07/18/2021, 12:24 PM

## 2021-07-18 NOTE — Discharge Summary (Signed)
Patient ID: Alison Chandler MRN: [id number] DOB/AGE: [birth year] 80 y.o.  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 female who was admitted 07/15/2021 for operative treatment ofFracture of right tibial plateau. Patient has severe unremitting pain that affects sleep, daily activities, and work/hobbies. After pre-op clearance the patient was taken to the operating room on 07/15/2021 and underwent  Procedure(s): OPEN REDUCTION INTERNAL FIXATION (ORIF) RIGHT TIBIAL PLATEAU.    Patient was given perioperative antibiotics:  Anti-infectives (From admission, onward)    Start     Dose/Rate Route Frequency Ordered Stop   07/15/21 2100  ceFAZolin (ANCEF) IVPB 1 g/50 mL premix        1 g 100 mL/hr over 30 Minutes Intravenous Every 6 hours 07/15/21 2002 07/16/21 0854   07/15/21 1345  ceFAZolin (ANCEF) IVPB 2g/100 mL premix  Status:  Discontinued        2 g 200 mL/hr over 30 Minutes Intravenous On call to O.R. 07/15/21 1341 07/15/21 1958        Patient was given sequential compression devices, early ambulation, and chemoprophylaxis to prevent DVT.  Patient benefited maximally from hospital stay and there were no complications.    Recent vital signs: Patient Vitals for the past 24 hrs:  BP Temp Temp src Pulse Resp SpO2   07/18/21 0743 (!) 164/88 (!) 97.4 F (36.3 C) Oral 68 15 97 %  07/18/21 0438 (!) 178/76 97.6 F (36.4 C) Oral 76 18 97 %  07/17/21 2010 (!) 146/62 97.8 F (36.6 C) Oral 77 16 98 %  07/17/21 1543 (!) 182/78 98.3 F (36.8 C) Oral 91 15 97 %     Recent laboratory studies: No results for input(s): WBC, HGB, HCT, PLT, NA, K, CL, CO2, BUN, CREATININE, GLUCOSE, INR, CALCIUM in the last 72 hours.  Invalid input(s): PT, 2   Discharge Medications:   Allergies as of 07/18/2021       Reactions   Actonel [risedronate Sodium] Nausea Only   Amlodipine Nausea Only        Medication List     TAKE these medications    acetaminophen 500 MG tablet Commonly known as: TYLENOL Take 500 mg by mouth in the morning and at bedtime.   apixaban 2.5 MG Tabs tablet Commonly known as: ELIQUIS Take 1 tablet (2.5 mg total) by mouth 2 (two) times daily.   atenolol 50 MG tablet Commonly known as: TENORMIN Take 50 mg by mouth every evening.   atorvastatin 40 MG tablet Commonly known as: LIPITOR Take 20 mg by mouth at bedtime.   Cal Mag Zinc +D3 Tabs Take 1 tablet by mouth daily.   Dialyvite Vitamin D 5000 125 MCG (5000 UT) capsule Generic drug: Cholecalciferol Take 5,000 Units by mouth every evening.   Fish Oil 1200 MG  Caps Take 1,200 mg by mouth every evening.   GLUCOSAMINE-CHONDROITIN PO Take 1 tablet by mouth every evening.   HYDROcodone-acetaminophen 5-325 MG tablet Commonly known as: NORCO/VICODIN Take 1-2 tablets by mouth every 4 (four) hours as needed for moderate pain (pain score 4-6). What changed:  how much to take when to take this reasons to take this   methocarbamol 500 MG tablet Commonly known as: ROBAXIN Take 1 tablet (500 mg total) by mouth every 6 (six) hours as needed for muscle spasms.   naproxen sodium 220 MG tablet Commonly known as: ALEVE Take 220 mg by mouth daily as needed (pain).   ondansetron 4 MG disintegrating tablet Commonly known as: Zofran  ODT Take 1 tablet (4 mg total) by mouth every 8 (eight) hours as needed for nausea or vomiting.   SYSTANE OP Place 1 drop into both eyes daily as needed (dry eyes).   traMADol 50 MG tablet Commonly known as: ULTRAM Take 1-2 tablets (50-100 mg total) by mouth every 6 (six) hours as needed.   VITAMIN B-12 PO Take 2,500 mcg by mouth every evening.               Durable Medical Equipment  (From admission, onward)           Start     Ordered   07/15/21 2002  DME 3 n 1  Once        07/15/21 2002   07/15/21 2002  DME Walker rolling  Once       Question Answer Comment  Walker: With 5 Inch Wheels   Patient needs a walker to treat with the following condition Closed fracture of right tibial plateau, initial encounter      07/15/21 2002            Diagnostic Studies: DG Knee 1-2 Views Right  Result Date: 07/15/2021 CLINICAL DATA:  Right tibial plateau fracture EXAM: RIGHT KNEE - 1-2 VIEW COMPARISON:  07/09/2021 FLUOROSCOPY TIME:  Radiation Exposure Index (as provided by the fluoroscopic device): 4.42 mGy If the device does not provide the exposure index: Fluoroscopy Time:  1 minute 36 seconds Number of Acquired Images:  2 FINDINGS: Fixation sideplate is noted along the lateral aspect of the proximal tibia with multiple fixation screws. The known tibial plateau fracture is again seen without significant displacement. No soft tissue abnormality is noted. IMPRESSION: ORIF of proximal tibial fracture. Electronically Signed   By: Inez Catalina M.D.   On: 07/15/2021 19:14   DG Tibia/Fibula Right  Result Date: 07/09/2021 CLINICAL DATA:  Fall today.  Right knee injury. EXAM: RIGHT TIBIA AND FIBULA - 2 VIEW COMPARISON:  Knee radiographs same date. FINDINGS: No change in the minimally displaced intra-articular fracture of the proximal tibia, involving the lateral tibial plateau and tibial spines. The fibula appears intact. No distal injuries are identified. There is soft tissue swelling  throughout the lower leg without evidence of foreign body. Mild degenerative changes are present at the ankle. IMPRESSION: Unchanged appearance of intra-articular fracture of the proximal tibia. No distal injury identified. Electronically Signed   By: Richardean Sale M.D.   On: 07/09/2021 19:14   CT Knee Right Wo Contrast  Result Date: 07/09/2021 CLINICAL DATA:  Knee fracture post fall. EXAM: CT OF THE RIGHT  KNEE WITHOUT CONTRAST TECHNIQUE: Multidetector CT imaging of the right knee was performed according to the standard protocol. Multiplanar CT image reconstructions were also generated. COMPARISON:  Radiographs same date. FINDINGS: Bones/Joint/Cartilage Intra-articular fracture of the  proximal tibia involve the articular surface of the lateral tibial plateau and results in up to 2 mm displacement of the articular surface. There is no significant depression of the articular surface. The fracture also extends into the tibial spines, although spares the articular surface of the medial tibial plateau. There is distal extension of a nondisplaced component into the medial tibial metaphysis. There is no involvement of the proximal tibiofibular joint. The proximal fibula, distal femur and patella are intact. There are underlying moderate tricompartmental degenerative changes. There is a moderate size lipohemarthrosis. Ligaments Suboptimally assessed by CT. The cruciate ligaments appear grossly intact. Bilateral meniscal chondrocalcinosis. Muscles and Tendons No muscular abnormalities are identified. The extensor mechanism is intact. Soft tissues Small Baker's cyst. Mild femoropopliteal atherosclerosis. No evidence of foreign body or soft tissue emphysema. IMPRESSION: 1. Minimally displaced intra-articular fracture of the proximal tibia as described, involving the articular surface of the lateral tibial plateau. 2. Associated lipohemarthrosis. 3. The distal femur and proximal fibula are intact. Underlying moderate  tricompartmental degenerative changes. Electronically Signed   By: Richardean Sale M.D.   On: 07/09/2021 19:56   DG Knee Complete 4 Views Right  Result Date: 07/09/2021 CLINICAL DATA:  Fall, mechanical fall with RIGHT knee injury 1 hour ago. EXAM: RIGHT KNEE - COMPLETE 4+ VIEW COMPARISON:  March of 2022. FINDINGS: Oblique fracture of the lateral or tibial plateau with comminution extending also through the tibial spines and obliquely from lateral to medial across the tibial metaphysis with mild displacement of the medial fracture fragment which includes the medial tibial plateau and a portion of the tibial metaphysis. Associated moderate joint effusion. Osteopenia and degenerative changes as on previous imaging. IMPRESSION: Tibial plateau fracture extending obliquely across the tibial metaphysis from lateral tibial plateau to medial tibial metaphysis with mild depression as result of the lateral tibial plateau. Mild comminution includes extension across the tibial spines. Joint effusion associated with above fractures. Electronically Signed   By: Zetta Bills M.D.   On: 07/09/2021 18:10   DG C-Arm 1-60 Min-No Report  Result Date: 07/15/2021 Fluoroscopy was utilized by the requesting physician.  No radiographic interpretation.   DG C-Arm 1-60 Min-No Report  Result Date: 07/15/2021 Fluoroscopy was utilized by the requesting physician.  No radiographic interpretation.    Disposition: Discharge disposition: 03-Skilled [place]     Mcarthur Rossetti, MD. Schedule an appointment as soon as possible for a visit in 2 week(s).   Specialty: Orthopedic Surgery Contact information: [address] [place] [address] [phone]                  Signed: Mcarthur Rossetti 07/18/2021, 2:00 PM

## 2021-07-18 NOTE — Progress Notes (Signed)
Report called to nurse Deveron Furlong at [place] [facility] at [facility]. Questions and concerns answered.

## 2021-07-18 NOTE — TOC Progression Note (Addendum)
Transition of Care [facility]) - Initial/Assessment Note    Patient Details  Name: Alison Chandler MRN: [id number] Date of Birth: 1940-10-02  Transition of Care [facility]) CM/SW Contact:    Milinda Antis, [place] Phone Number: 07/18/2021, 12:18 PM  Clinical Narrative:                 CSW called the Sallee Lange with [facility], [phone], to inquire about when the patient can be admitted.  There was no answer.  CSW left a VM requesting a returned call.   12:38-  CSW received a returned call from the facility.  They can accept the patient today.  Attending notified.  TOC barrier to d/c= discharge summary   Patient Goals and CMS Choice Patient states their goals for this hospitalization and ongoing recovery are:: walking again CMS Medicare.gov Compare Post Acute Care list provided to:: Patient Choice offered to / list presented to : Patient  Expected Discharge Plan and Services Expected Discharge Plan: [place] Choice: [place] arrangements for the past 2 months: Single Family Home                                      Prior Living Arrangements/Services Living arrangements for the past 2 months: Single Family Home Lives with:: Spouse Patient language and need for interpreter reviewed:: Yes Do you feel safe going back to the place where you live?: Yes      Need for Family Participation in Patient Care: Yes (Comment) Care giver support system in place?: Yes (comment) Current home services: Other (comment) (none) Criminal Activity/Legal Involvement Pertinent to Current Situation/Hospitalization: No - Comment as needed  Activities of Daily Living Home Assistive Devices/Equipment: Eyeglasses ADL Screening (condition at time of admission) Patient's cognitive ability adequate to safely complete daily activities?: Yes Is the patient deaf or have difficulty hearing?: No Does the patient have difficulty seeing, even  when wearing glasses/contacts?: No Does the patient have difficulty concentrating, remembering, or making decisions?: No Patient able to express need for assistance with ADLs?: Yes Does the patient have difficulty dressing or bathing?: No Independently performs ADLs?: Yes (appropriate for developmental age) Does the patient have difficulty walking or climbing stairs?: Yes Weakness of Legs: Right Weakness of Arms/Hands: None  Permission Sought/Granted Permission sought to share information with : Family Supports Permission granted to share information with : Yes, Verbal Permission Granted  Share Information with NAME: husband Mikeal Hawthorne  Permission granted to share info w AGENCY: SNF        Emotional Assessment Appearance:: Appears stated age Attitude/Demeanor/Rapport: Engaged Affect (typically observed): Appropriate, Pleasant Orientation: : Oriented to Self, Oriented to Place, Oriented to  Time, Oriented to Situation Alcohol / Substance Use: Not Applicable Psych Involvement: No (comment)  Admission diagnosis:  Closed fracture of right tibial plateau, initial encounter [S82.141A] Patient Active Problem List   Diagnosis Date Noted   Fracture of right tibial plateau 07/15/2021   Closed fracture of right tibial plateau, initial encounter 07/15/2021   Unilateral primary osteoarthritis, left knee 12/19/2019   Benign essential hypertension 10/30/2019   Hypercalcemia 10/30/2019   Acute medial meniscal tear, right, initial encounter 08/16/2019   Iatrogenic hypocalcemia 06/05/2019   Primary hyperparathyroidism ([place])    Nuclear sclerotic cataract of both eyes 10/05/2018   RPE mottling of macula 10/05/2018   Flat foot 06/20/2018   Tibialis posterior  tendonitis, left 06/20/2018   Acute pain of right shoulder 05/04/2018   Chronic pain of left knee 12/22/2017   Subjective memory complaints 10/08/2017   It band syndrome, left 08/11/2017   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[place]) 03/23/2017   Meningioma ([place])  03/23/2017   Idiopathic peripheral neuropathy 03/11/2017   Imbalance 03/11/2017   Paresthesias 12/06/2016   Tremor 12/06/2016   Age-related osteoporosis without current pathological fracture 12/03/2016   Nephrolithiasis 12/03/2016   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/21/2016   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   Hypertropia of left eye 09/05/2014   Intermittent exotropia of left eye 09/05/2014   Midline cystocele 05/10/2014   Stress bladder incontinence, female 05/10/2014   Benign neoplasm of colon 04/16/2014   Calculus of ureter 04/16/2014   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   Esophageal reflux 04/16/2014   Gross hematuria 04/16/2014   Headache 04/16/2014   Hypothyroidism 04/16/2014   Increased urinary frequency 04/16/2014   Keratoconjunctivitis sicca, 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 04/16/2014   Osteopenia, 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   Other seborrheic keratosis 04/16/2014   Other synovitis and tenosynovitis 04/16/2014   Presbycusis 04/16/2014   Primary localized osteoarthrosis, pelvic region and thigh 04/16/2014   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

## 2021-07-18 NOTE — Progress Notes (Signed)
Patient discharged with PTAR at this time. Patient hemodynamically stable during discharge.

## 2021-07-18 NOTE — Progress Notes (Signed)
Attempted to call report at [facility] at [place]. No answer. Will attempt later.

## 2021-07-18 NOTE — Discharge Instructions (Signed)
No weight at all on the right leg until further notice. Dressing on right leg can get wet and stay on until her ortho outpatient follow-up.

## 2021-07-23 ENCOUNTER — Telehealth: Payer: Self-pay

## 2021-07-23 ENCOUNTER — Ambulatory Visit: Payer: Medicare Other | Admitting: Orthopaedic Surgery

## 2021-07-23 NOTE — Telephone Encounter (Signed)
Called and left a Vm advising patient to return if she wanted to proceed with gel injection.  VOB submitted for Monovisc, left knee. BV pending.

## 2021-07-28 ENCOUNTER — Ambulatory Visit (INDEPENDENT_AMBULATORY_CARE_PROVIDER_SITE_OTHER): Payer: Medicare Other

## 2021-07-28 ENCOUNTER — Ambulatory Visit (INDEPENDENT_AMBULATORY_CARE_PROVIDER_SITE_OTHER): Payer: Medicare Other | Admitting: Orthopaedic Surgery

## 2021-07-28 ENCOUNTER — Telehealth: Payer: Self-pay

## 2021-07-28 ENCOUNTER — Encounter: Payer: Self-pay | Admitting: Orthopaedic Surgery

## 2021-07-28 DIAGNOSIS — Z8781 Personal history of (healed) traumatic fracture: Secondary | ICD-10-CM | POA: Diagnosis not present

## 2021-07-28 DIAGNOSIS — Z9889 Other specified postprocedural states: Secondary | ICD-10-CM

## 2021-07-28 DIAGNOSIS — S82201D Unspecified fracture of shaft of right tibia, subsequent encounter for closed fracture with routine healing: Secondary | ICD-10-CM

## 2021-07-28 DIAGNOSIS — S82141A Displaced bicondylar fracture of right tibia, initial encounter for closed fracture: Secondary | ICD-10-CM

## 2021-07-28 NOTE — Telephone Encounter (Signed)
Approved for Monovisc, left knee. [place] deductible has been met [employer], will pick up remaining eligible expenses at 100%. No Co-pay No PA required

## 2021-07-28 NOTE — Progress Notes (Signed)
The patient is only 2 weeks status post open reduction/internal fixation of a complex right tibial plateau fracture.  She is staying in a skilled nursing facility and has been nonweightbearing on her right knee.  She says is tougher not to walk but overall she is doing well from a pain standpoint.  She gets more spasms at night and that is more bothersome to her and Robaxin has not helped.  Examination of her right knee shows no significant effusion.  The right proximal tibia incision is healed nicely so I remove the sutures in place Steri-Strips.  Her calf is soft.  She did allow me to flex and extend her right knee.    Two views of the right knee show the hardware is intact and the fracture lines are visible to be expected at only 2 weeks postoperative.  She will continue nonweightbearing on the right lower extremity.  They can work on quad strengthening exercises and range of motion of the knee.  We will try baclofen for spasms.  I would like to see her back in 4 weeks with a repeat 2 views of the right knee.  This will be done with her laying down.  At that point hopefully we can advance her to 50% weightbearing depending on what the x-rays show.

## 2021-08-27 ENCOUNTER — Ambulatory Visit (INDEPENDENT_AMBULATORY_CARE_PROVIDER_SITE_OTHER): Payer: Medicare Other

## 2021-08-27 ENCOUNTER — Ambulatory Visit (INDEPENDENT_AMBULATORY_CARE_PROVIDER_SITE_OTHER): Payer: Medicare Other | Admitting: Orthopaedic Surgery

## 2021-08-27 ENCOUNTER — Encounter: Payer: Self-pay | Admitting: Orthopaedic Surgery

## 2021-08-27 ENCOUNTER — Other Ambulatory Visit: Payer: Self-pay

## 2021-08-27 DIAGNOSIS — S82141A Displaced bicondylar fracture of right tibia, initial encounter for closed fracture: Secondary | ICD-10-CM

## 2021-08-27 DIAGNOSIS — Z9889 Other specified postprocedural states: Secondary | ICD-10-CM

## 2021-08-27 DIAGNOSIS — S82141D Displaced bicondylar fracture of right tibia, subsequent encounter for closed fracture with routine healing: Secondary | ICD-10-CM | POA: Diagnosis not present

## 2021-08-27 DIAGNOSIS — Z8781 Personal history of (healed) traumatic fracture: Secondary | ICD-10-CM | POA: Diagnosis not present

## 2021-08-27 NOTE — Progress Notes (Signed)
The patient is now 6-week status post open reduction/internal fixation of a right tibial plateau fracture.  She has been nonweightbearing on the right lower extremity and recovering at a skilled nursing facility.  We have allowed her to flex and extend her knee.  She has been on Eliquis as a blood thinner.  On examination, her right knee incision looks good.  There is moderate swelling to be expected with the right knee.  She has good range of motion of her knee.  There is only slight swelling in her foot and ankle.  Her calf is soft on the right side.  X-rays of the right knee show the fracture line still visible to be expected.  There is no complicating features of the hardware.  The joint space is congruent.  Will let her advance to 50% weightbearing on her right lower extremity.  She can stop Eliquis as well.  I will see her back in 4 weeks with a repeat 2 views of her right knee and can hopefully advance her to full weightbearing at that time.

## 2021-09-08 ENCOUNTER — Telehealth: Payer: Self-pay | Admitting: Orthopaedic Surgery

## 2021-09-08 ENCOUNTER — Other Ambulatory Visit: Payer: Self-pay | Admitting: Orthopaedic Surgery

## 2021-09-08 MED ORDER — BACLOFEN 10 MG PO TABS: 10.0000 mg | ORAL_TABLET | Freq: Three times a day (TID) | ORAL | 0 refills | Status: DC | PRN

## 2021-09-08 NOTE — Telephone Encounter (Signed)
Please advise 

## 2021-09-08 NOTE — Telephone Encounter (Signed)
Pt called requesting a prescription of baclofen. Please send to the [place] we have on file. Pt phone number is [phone].

## 2021-09-12 ENCOUNTER — Encounter: Payer: Self-pay | Admitting: Orthopaedic Surgery

## 2021-09-24 ENCOUNTER — Encounter: Payer: Self-pay | Admitting: Orthopaedic Surgery

## 2021-09-24 ENCOUNTER — Ambulatory Visit (INDEPENDENT_AMBULATORY_CARE_PROVIDER_SITE_OTHER): Payer: Medicare Other

## 2021-09-24 ENCOUNTER — Ambulatory Visit (INDEPENDENT_AMBULATORY_CARE_PROVIDER_SITE_OTHER): Payer: Medicare Other | Admitting: Orthopaedic Surgery

## 2021-09-24 DIAGNOSIS — Z9889 Other specified postprocedural states: Secondary | ICD-10-CM | POA: Diagnosis not present

## 2021-09-24 DIAGNOSIS — S82141D Displaced bicondylar fracture of right tibia, subsequent encounter for closed fracture with routine healing: Secondary | ICD-10-CM | POA: Diagnosis not present

## 2021-09-24 DIAGNOSIS — Z8781 Personal history of (healed) traumatic fracture: Secondary | ICD-10-CM | POA: Diagnosis not present

## 2021-09-24 DIAGNOSIS — S82141A Displaced bicondylar fracture of right tibia, initial encounter for closed fracture: Secondary | ICD-10-CM

## 2021-09-24 NOTE — Progress Notes (Signed)
The patient is now 9 weeks status post open reduction/internal fixation of a complex right tibial plateau fracture.  We have had her only 50% weightbearing on her right lower extremity.  She reports some medial pain with weightbearing.  She denies pain with rest.  She reports good range of motion of her right knee.  Home health therapy has been coming some to her house.  On exam her right knee has a healed incision.  There is no significant effusion.  [name] is excellent and the knee is ligamentously stable.  2 views of the right knee show a healing fracture with no complicating features of the hardware.  The fracture is not healed completely.  At this point there is enough healing to advance her weightbearing status to full weightbearing as tolerated.  I gave her a note to give to home health therapy to work on her balance and coordination with weightbearing as tolerated as well as strengthening.  She may end up benefiting more from outpatient therapy.  I would like to see her back in 4 weeks with a repeat AP and lateral of the right knee.  This does not need to be standing.

## 2021-10-22 ENCOUNTER — Other Ambulatory Visit: Payer: Self-pay

## 2021-10-22 ENCOUNTER — Ambulatory Visit (INDEPENDENT_AMBULATORY_CARE_PROVIDER_SITE_OTHER): Payer: Medicare Other

## 2021-10-22 ENCOUNTER — Encounter: Payer: Self-pay | Admitting: Orthopaedic Surgery

## 2021-10-22 ENCOUNTER — Ambulatory Visit (INDEPENDENT_AMBULATORY_CARE_PROVIDER_SITE_OTHER): Payer: Medicare Other | Admitting: Orthopaedic Surgery

## 2021-10-22 DIAGNOSIS — Z8781 Personal history of (healed) traumatic fracture: Secondary | ICD-10-CM

## 2021-10-22 DIAGNOSIS — S82141A Displaced bicondylar fracture of right tibia, initial encounter for closed fracture: Secondary | ICD-10-CM

## 2021-10-22 DIAGNOSIS — M1712 Unilateral primary osteoarthritis, left knee: Secondary | ICD-10-CM

## 2021-10-22 DIAGNOSIS — Z9889 Other specified postprocedural states: Secondary | ICD-10-CM | POA: Diagnosis not present

## 2021-10-22 MED ORDER — NAPROXEN 500 MG PO TABS: 500.0000 mg | ORAL_TABLET | Freq: Two times a day (BID) | ORAL | 3 refills | Status: DC | PRN

## 2021-10-22 NOTE — Progress Notes (Signed)
The patient is now 84-month status post open reduction/internal excision of a complex right tibial plateau fracture.  She is now weightbearing as tolerated.  She is 81 years old very active.  She does have known osteoarthritis of the left knee that we were planning to replace it at some point but then she sustained this right knee fracture.  She is ambulating with a walker and has 2 more home therapy visits where they are working on her balance and coordination which she says has improved.  She is asking for refill of Naprosyn.  They are working with a cane with her as well.  Her right operative knee moves smoothly and fluidly with full range of motion.  The incision is healed.  The pain is minimal.  2 views of the right knee show the fracture is healing nicely.  There is no complicating features of the hardware.  I believe it is almost completely healed at this standpoint.  She will continue increase her activities as comfort allows.  We will see her back in 3 months for follow-up in general as it relates to potentially scheduling a left knee replacement.  No x-rays are needed at that visit.  I will send in some Naprosyn for her as well.

## 2022-01-19 ENCOUNTER — Ambulatory Visit (INDEPENDENT_AMBULATORY_CARE_PROVIDER_SITE_OTHER): Payer: Medicare Other | Admitting: Orthopaedic Surgery

## 2022-01-19 ENCOUNTER — Encounter: Payer: Self-pay | Admitting: Orthopaedic Surgery

## 2022-01-19 DIAGNOSIS — M1712 Unilateral primary osteoarthritis, left knee: Secondary | ICD-10-CM | POA: Diagnosis not present

## 2022-01-19 DIAGNOSIS — Z8781 Personal history of (healed) traumatic fracture: Secondary | ICD-10-CM | POA: Diagnosis not present

## 2022-01-19 DIAGNOSIS — Z9889 Other specified postprocedural states: Secondary | ICD-10-CM | POA: Diagnosis not present

## 2022-01-19 NOTE — Progress Notes (Signed)
The patient is now 7-monthstatus post open reduction/internal fixation of a right knee tibial plateau fracture.  She says that is doing much better overall.  She is 81years old.  She is considering a left knee replacement.  She has well-documented end-stage arthritis of left knee has tried and failed all forms conservative treatment.  However she wants to hold off on surgery as she works on her balance and coordination.  She feels like she is doing well overall but really wants to get stronger.  Her left knee does show some slight varus malalignment and pain throughout the arc of motion of her knee and again has well-documented osteoarthritis.  Her left operative knee has no effusion.  Her incisions healed nicely and her range of motion is full and the knee feels stable.  She has good strength in both knees.  I agree with her still working on her balance coordination of time.  When she does get to the point where she wants to consider a left knee replacement, she will give [facility] call.  All question concerns were answered and addressed.

## 2022-03-26 ENCOUNTER — Telehealth: Payer: Self-pay | Admitting: Orthopaedic Surgery

## 2022-03-26 NOTE — Telephone Encounter (Signed)
Please advise 

## 2022-03-26 NOTE — Telephone Encounter (Signed)
Pt called. Wants to now if she needs to come see blackman before her surgery? And she has questions about physical therapy!!   [phone]

## 2022-04-07 ENCOUNTER — Other Ambulatory Visit: Payer: Self-pay

## 2022-04-29 ENCOUNTER — Ambulatory Visit (INDEPENDENT_AMBULATORY_CARE_PROVIDER_SITE_OTHER): Payer: Medicare Other | Admitting: Orthopaedic Surgery

## 2022-04-29 VITALS — Wt 227.0 lb

## 2022-04-29 DIAGNOSIS — G8929 Other chronic pain: Secondary | ICD-10-CM

## 2022-04-29 DIAGNOSIS — M25562 Pain in left knee: Secondary | ICD-10-CM

## 2022-04-29 DIAGNOSIS — M1712 Unilateral primary osteoarthritis, left knee: Secondary | ICD-10-CM | POA: Diagnosis not present

## 2022-04-29 NOTE — Progress Notes (Signed)
The patient is an 81 year old female well-known to me.  She is actually scheduled for a left total knee replacement by the end of September which is in about 3 weeks she states.  This year she has been dealing with recovering from a right knee tibial plateau fracture.  She is ambulate with a cane.  She says her left knee is not hurting severely bad but she is concerned more about her balance and coordination.  Her left knee does have some pain throughout the arc of motion but she has excellent range of motion of the knee and is ligamentously stable.  Remotely she has had a hyaluronic acid for that knee in the past and it is helped.  Her right knee is moving well.  I felt this point we should delay her surgery and send her to outpatient physical therapy to work on strengthening both of her knees as well as having them work on her balance and coordination.  I think it is worth trying at least 1 more hyaluronic acid injection for her left knee as well given the fact that injection has helped in the past and her pain is not severe with her left knee.  She agrees with this treatment plan.  We will see her back in 4 weeks from now she will of hopefully had an therapy and we can hopefully place hyaluronic acid in the left knee if we can get this ordered and approved.  I would also like a new standing AP and lateral of her left knee at that visit.  She agrees with this treatment plan.  Her husband is with her as well who also agrees.

## 2022-04-30 ENCOUNTER — Telehealth: Payer: Self-pay

## 2022-04-30 NOTE — Addendum Note (Signed)
Addended byLaurann Montana on: 04/30/2022 03:52 PM   Modules accepted: Orders

## 2022-04-30 NOTE — Telephone Encounter (Signed)
Auth needed for left knee gel injection

## 2022-05-01 NOTE — Telephone Encounter (Signed)
VOB submitted for Monovisc, left knee 

## 2022-05-06 ENCOUNTER — Ambulatory Visit: Payer: Medicare Other | Admitting: Orthopaedic Surgery

## 2022-05-14 ENCOUNTER — Encounter: Payer: Self-pay | Admitting: Physical Therapy

## 2022-05-14 ENCOUNTER — Ambulatory Visit: Payer: Medicare Other | Attending: Orthopaedic Surgery | Admitting: Physical Therapy

## 2022-05-14 DIAGNOSIS — M6281 Muscle weakness (generalized): Secondary | ICD-10-CM | POA: Diagnosis present

## 2022-05-14 DIAGNOSIS — G8929 Other chronic pain: Secondary | ICD-10-CM | POA: Insufficient documentation

## 2022-05-14 DIAGNOSIS — M25562 Pain in left knee: Secondary | ICD-10-CM | POA: Insufficient documentation

## 2022-05-14 DIAGNOSIS — R2681 Unsteadiness on feet: Secondary | ICD-10-CM | POA: Diagnosis present

## 2022-05-14 DIAGNOSIS — R262 Difficulty in walking, not elsewhere classified: Secondary | ICD-10-CM | POA: Insufficient documentation

## 2022-05-14 NOTE — Therapy (Signed)
OUTPATIENT PHYSICAL THERAPY LOWER EXTREMITY EVALUATION   Patient Name: Alison Chandler MRN: [id number] DOB:07-09-1941, 81 y.o., female Today's Date: 05/14/2022   PT End of Session - 05/14/22 1109     Visit Number 1    Date for PT Re-Evaluation 07/09/22    Authorization Type Medicare + Aet[employer]    Progress Note Due on Visit 10    PT Start Time 1105    PT Stop Time 1152    PT Time Calculation (min) 47 min    Activity Tolerance Patient tolerated treatment well    Behavior During Therapy WFL for tasks assessed/performed             Past Medical History:  Diagnosis Date   Arthritis    Hearing loss    History of kidney stones    Hyperlipidemia    Hypertension    Neuromuscular disorder ([place])    neuropathy legs no medicine   Parathyroid dysfunction ([place])    one Quit working was removed   Tremor, essential    Past Surgical History:  Procedure Laterality Date   colonscopy  01/27/2012   JOINT REPLACEMENT Right 2013   THR   KIDNEY STONE SURGERY     ORIF TIBIA PLATEAU Right 07/15/2021   Procedure: OPEN REDUCTION INTERNAL FIXATION (ORIF) RIGHT TIBIAL PLATEAU;  Surgeon: Mcarthur Rossetti, MD;  Location: [facility];  Service: Orthopedics;  Laterality: Right;   PARATHYROIDECTOMY N/A 05/10/2019   Procedure: PARATHYROIDECTOMY;  Surgeon: Fredirick Maudlin, MD;  Location: [facility];  Service: General;  Laterality: N/A;   TOTAL HIP ARTHROPLASTY  02/19/2012   Procedure: TOTAL HIP ARTHROPLASTY ANTERIOR APPROACH;  Surgeon: Mcarthur Rossetti, MD;  Location: [facility];  Service: Orthopedics;  Laterality: Right;   Patient Active Problem List   Diagnosis Date Noted   Fracture of right tibial plateau 07/15/2021   Closed fracture of right tibial plateau, initial encounter 07/15/2021   Unilateral primary osteoarthritis, left knee 12/19/2019   Benign essential hypertension 10/30/2019   Hypercalcemia 10/30/2019   Acute medial meniscal tear, right, initial encounter 08/16/2019   Iatrogenic  hypocalcemia 06/05/2019   Primary hyperparathyroidism ([place])    Nuclear sclerotic cataract of both eyes 10/05/2018   RPE mottling of macula 10/05/2018   Flat foot 06/20/2018   Tibialis posterior tendonitis, left 06/20/2018   Acute pain of right shoulder 05/04/2018   Chronic pain of left knee 12/22/2017   Subjective memory complaints 10/08/2017   It band syndrome, left 08/11/2017   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[place]) 03/23/2017   Meningioma ([place]) 03/23/2017   Idiopathic peripheral neuropathy 03/11/2017   Imbalance 03/11/2017   Paresthesias 12/06/2016   Tremor 12/06/2016   Age-related osteoporosis without current pathological fracture 12/03/2016   Nephrolithiasis 12/03/2016   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/21/2016   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   Hypertropia of left eye 09/05/2014   Intermittent exotropia of left eye 09/05/2014   Midline cystocele 05/10/2014   Stress bladder incontinence, female 05/10/2014   Benign neoplasm of colon 04/16/2014   Calculus of ureter 04/16/2014   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   Esophageal reflux 04/16/2014   Gross hematuria 04/16/2014   Headache 04/16/2014   Hypothyroidism 04/16/2014   Increased urinary frequency 04/16/2014   Keratoconjunctivitis sicca, 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 04/16/2014   Osteopenia, 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   Other seborrheic keratosis 04/16/2014   Other synovitis and tenosynovitis 04/16/2014   Presbycusis 04/16/2014   Primary localized osteoarthrosis, pelvic region and thigh 04/16/2014   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   Strabismus  04/16/2014   Urethral stenosis 04/16/2014   Urinary tract infection 04/16/2014   Allergic rhinitis 04/16/2014   Degenerative arthritis of hip 02/19/2012    PCP: Verdell Carmine.,  MD  REFERRING PROVIDER: Mcarthur Rossetti, MD  REFERRING DIAG: [phone] (ICD-10-CM) - Chronic pain of left knee  THERAPY DIAG:  Unsteadiness on feet  Difficulty in walking, not elsewhere  classified  Chronic pain of left knee  Muscle weakness (generalized)  Rationale for Evaluation and Treatment Rehabilitation  ONSET DATE:  fell last September fracturing R knee   SUBJECTIVE:   SUBJECTIVE STATEMENT: Alison Chandler reports she had a fall last November fracturing R tibial plateau, she was originally scheduled to have L knee replacement surgery, when she got sick and surgery was put off.  She fell going to an appointment, catching her toe.  She has not fallen since that fall, but feels like her balance is very poor and worried about falling again.  She plans to get her L knee replaced early next year.   PERTINENT HISTORY: R tibial fracture, R TKA, L knee OA/chronic pain, neuropathy, essential tremor, HTN, parathyroid dysfunction  PAIN:  Are you having pain? Yes: NPRS scale: 0/10 Pain location: L knee Pain description: soreness, acheyat worst 6/10  Aggravating factors: being on feet a lot, prolonged walking/standing Relieving factors: sitting, Aleve  PRECAUTIONS: Fall  WEIGHT BEARING RESTRICTIONS No  FALLS:  Has patient fallen in last 6 months? No but feels unsteady  LIVING ENVIRONMENT: Lives with: lives with their spouse Lives in: House/apartment Stairs: Yes: Internal: 12 steps; on right going up and on left going up and External: 1 steps; none Has following equipment at home: Single point cane  OCCUPATION: retired  PLOF: Independent  PATIENT GOALS improve balance, be able to go shopping again and feel confident with balance   OBJECTIVE:   DIAGNOSTIC FINDINGS: L knee 11/06/2020 2 views of the left knee show tricompartment arthritic changes involving  mainly the lateral compartment and the patellofemoral joint.  There are  large osteophytes in all 3 compartments and significant joint space  narrowing.  PATIENT SURVEYS:  ABC scale 800/1600 = 50% = moderate level of physical functioning.   COGNITION:  Overall cognitive status: Within functional limits for  tasks assessed     SENSATION: Numbness/tingling in toes both feet, diminished sensation 25% over L lateral aspect lower leg  EDEMA:  NA  MUSCLE LENGTH: NT  POSTURE: rounded shoulders, forward head, decreased lumbar lordosis, increased thoracic kyphosis, and downward looking gaze  PALPATION: NA  LOWER EXTREMITY ROM:  Active ROM Right eval Left eval  Knee flexion    Knee extension 0 0  Ankle dorsiflexion    Ankle plantarflexion     (Blank rows = not tested)  LOWER EXTREMITY MMT:  MMT Right eval Left eval  Hip flexion 4+ 4  Hip extension 4 4  Hip abduction 5 5  Hip adduction 5 5  Hip internal rotation    Hip external rotation    Knee flexion 4+ 4+  Knee extension 5 5  Ankle dorsiflexion 5 5  Ankle plantarflexion    Ankle inversion    Ankle eversion     (Blank rows = not tested)  LOWER EXTREMITY SPECIAL TESTS:  NA  FUNCTIONAL TESTS:  5 times sit to stand: 19 seconds with bil UE assist 2 minute walk test: 226 ft. Alison Chandler Balance Scale: TBD Dynamic Gait Index: 10/24  GAIT: Distance walked: 225' Assistive device utilized: Single point cane Level of  assistance: Modified independence Comments: visually slow gait speed, genu valgum (L>R), L ankle pronation, decreased w/s to LLE, decreased heel strike bil.     TODAY'S TREATMENT: 05/14/2022 - see patient education.    PATIENT EDUCATION:  Education details: education on findings, POC, initial HEP - sit to stands in front of counter focusing on leaning forward and pushing up through legs 2 x 5 3x/day. Person educated: Patient Education method: Customer service manager Education comprehension: verbalized understanding   HOME EXERCISE PROGRAM: TBD  ASSESSMENT:  CLINICAL IMPRESSION: KENESHA MOSHIER is a 81 y.o. female who was seen today for physical therapy evaluation and treatment for L knee OA, LE strengthening and balance training.  She was originally scheduled for L TKA this month but decided to delay  due to strength and balance deficits. Patient presents with physical impairments of impaired activity tolerance, impaired standing balance, impaired ambulation, and decreased safety awareness impacting safe and independent functional mobility. Examination revealed patient is at risk for falls and functional decline as evidenced by the following objective test measures:  5x sit to stand of  19 sec (>15sec indicates increased risk for falls and decreased BLE power), 2MWT 226 ft (439 ft is typical for age), and DGI 10/24 (<19/24 indicates high risk for falls). Patient will benefit from skilled physical therapy services to help reach the maximal level of functional independence and mobility. Marianna Payment demonstrates understanding of this plan of care and is in agreement with this plan.      OBJECTIVE IMPAIRMENTS Abnormal gait, decreased balance, decreased endurance, decreased mobility, difficulty walking, decreased strength, decreased safety awareness, improper body mechanics, postural dysfunction, and pain.   ACTIVITY LIMITATIONS carrying, lifting, bending, standing, squatting, stairs, transfers, and locomotion level  PARTICIPATION LIMITATIONS: cleaning, shopping, and community activity  PERSONAL FACTORS Age, Fitness, and 3+ comorbidities: L knee OA, R THA, HTN, essential tremor, neuropathy, parathyroid disfunction, obesity  are also affecting patient's functional outcome.   REHAB POTENTIAL: Good  CLINICAL DECISION MAKING: Evolving/moderate complexity  EVALUATION COMPLEXITY: Moderate   GOALS: Goals reviewed with patient? Yes  SHORT TERM GOALS: Target date: 05/28/2022   Patient will be independent with initial HEP. Baseline:  Goal status: INITIAL  2.  Patient will demonstrate decreased fall risk by scoring < 25 sec on TUG. Baseline: NT Goal status: INITIAL  3.  Patient will be educated on strategies to decrease risk of falls.  Baseline: needs review Goal status: INITIAL   LONG TERM  GOALS: Target date: 07/09/2022   Patient will be independent with advanced/ongoing HEP to improve outcomes and carryover.  Baseline:  Goal status: INITIAL  2.  Patient will be able to ambulate 600' with LRAD with good safety to access community.  Baseline: fatigues after 226 ft, SPC needed Goal status: INITIAL  3.  Patient will be able to step up/down curb safely with LRAD for safety with community ambulation.  Baseline: difficulty Goal status: INITIAL   4.  Patient will demonstrate improved functional LE strength as demonstrated by 5x STS <15 seconds. Baseline: 19 seconds with UE assist Goal status: INITIAL  5.  Patient will demonstrate at least 19/24 on DGI to improve gait stability and reduce risk for falls. Baseline: 10/24 Goal status: INITIAL  6.  Patient will score 45/56 on Berg Balance test to demonstrate lower risk of falls. (MCID= 8 points) .  Baseline: NT Goal status: INITIAL  7.  Patient will report 10% improvement  on ABC scale to demonstrate improved functional ability. Baseline: 800/1600 =  50% confidence Goal status: INITIAL    PLAN: PT FREQUENCY: 1-2x/week  PT DURATION: 8 weeks  PLANNED INTERVENTIONS: Therapeutic exercises, Therapeutic activity, Neuromuscular re-education, Balance training, Gait training, Patient/Family education, Self Care, Joint mobilization, Stair training, Orthotic/Fit training, Dry Needling, Cryotherapy, Moist heat, Ultrasound, Manual therapy, and Re-evaluation  PLAN FOR NEXT SESSION: Alison Chandler, HEP for balance/LE strengthening.    Rennie Natter, PT, DPT  05/14/2022, 12:19 PM

## 2022-05-18 ENCOUNTER — Ambulatory Visit: Payer: Medicare Other | Admitting: Physical Therapy

## 2022-05-18 ENCOUNTER — Encounter: Payer: Self-pay | Admitting: Physical Therapy

## 2022-05-18 DIAGNOSIS — R2681 Unsteadiness on feet: Secondary | ICD-10-CM

## 2022-05-18 DIAGNOSIS — G8929 Other chronic pain: Secondary | ICD-10-CM

## 2022-05-18 DIAGNOSIS — M6281 Muscle weakness (generalized): Secondary | ICD-10-CM

## 2022-05-18 DIAGNOSIS — R262 Difficulty in walking, not elsewhere classified: Secondary | ICD-10-CM

## 2022-05-18 NOTE — Therapy (Signed)
OUTPATIENT PHYSICAL THERAPY TREATMENT NOTE   Patient Name: Alison Chandler MRN: [id number] DOB:1940/11/07, 81 y.o., female Today's Date: 05/18/2022   PT End of Session - 05/18/22 1104     Visit Number 2    Date for PT Re-Evaluation 07/09/22    Authorization Type Medicare + Aetna    Progress Note Due on Visit 10    PT Start Time 1101    PT Stop Time 1150    PT Time Calculation (min) 49 min    Activity Tolerance Patient tolerated treatment well    Behavior During Therapy WFL for tasks assessed/performed             Past Medical History:  Diagnosis Date   Arthritis    Hearing loss    History of kidney stones    Hyperlipidemia    Hypertension    Neuromuscular disorder ([place])    neuropathy legs no medicine   Parathyroid dysfunction ([place])    one Quit working was removed   Tremor, essential    Past Surgical History:  Procedure Laterality Date   colonscopy  01/27/2012   JOINT REPLACEMENT Right 2013   THR   KIDNEY STONE SURGERY     ORIF TIBIA PLATEAU Right 07/15/2021   Procedure: OPEN REDUCTION INTERNAL FIXATION (ORIF) RIGHT TIBIAL PLATEAU;  Surgeon: Alison Rossetti, MD;  Location: [facility];  Service: Orthopedics;  Laterality: Right;   PARATHYROIDECTOMY N/A 05/10/2019   Procedure: PARATHYROIDECTOMY;  Surgeon: Alison Maudlin, MD;  Location: [facility];  Service: General;  Laterality: N/A;   TOTAL HIP ARTHROPLASTY  02/19/2012   Procedure: TOTAL HIP ARTHROPLASTY ANTERIOR APPROACH;  Surgeon: Alison Rossetti, MD;  Location[facility];  Service: Orthopedics;  Laterality: Right;   Patient Active Problem List   Diagnosis Date Noted   Fracture of right tibial plateau 07/15/2021   Closed fracture of right tibial plateau, initial encounter 07/15/2021   Unilateral primary osteoarthritis, left knee 12/19/2019   Benign essential hypertension 10/30/2019   Hypercalcemia 10/30/2019   Acute medial meniscal tear, right, initial encounter 08/16/2019   Iatrogenic hypocalcemia  06/05/2019   Primary hyperparathyroidism ([place])    Nuclear sclerotic cataract of both eyes 10/05/2018   RPE mottling of macula 10/05/2018   Flat foot 06/20/2018   Tibialis posterior tendonitis, left 06/20/2018   Acute pain of right shoulder 05/04/2018   Chronic pain of left knee 12/22/2017   Subjective memory complaints 10/08/2017   It band syndrome, left 08/11/2017   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[place]) 03/23/2017   Meningioma ([place]) 03/23/2017   Idiopathic peripheral neuropathy 03/11/2017   Imbalance 03/11/2017   Paresthesias 12/06/2016   Tremor 12/06/2016   Age-related osteoporosis without current pathological fracture 12/03/2016   Nephrolithiasis 12/03/2016   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/21/2016   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   Hypertropia of left eye 09/05/2014   Intermittent exotropia of left eye 09/05/2014   Midline cystocele 05/10/2014   Stress bladder incontinence, female 05/10/2014   Benign neoplasm of colon 04/16/2014   Calculus of ureter 04/16/2014   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   Esophageal reflux 04/16/2014   Gross hematuria 04/16/2014   Headache 04/16/2014   Hypothyroidism 04/16/2014   Increased urinary frequency 04/16/2014   Keratoconjunctivitis sicca, 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 04/16/2014   Osteopenia, 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   Other seborrheic keratosis 04/16/2014   Other synovitis and tenosynovitis 04/16/2014   Presbycusis 04/16/2014   Primary localized osteoarthrosis, pelvic region and thigh 04/16/2014   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   Strabismus 04/16/2014  Urethral stenosis 04/16/2014   Urinary tract infection 04/16/2014   Allergic rhinitis 04/16/2014   Degenerative arthritis of hip 02/19/2012    PCP: Alison Chandler.,  MD  REFERRING PROVIDER: Mcarthur Rossetti, MD  REFERRING DIAG: [phone] (ICD-10-CM) - Chronic pain of left knee  THERAPY DIAG:  Unsteadiness on feet  Difficulty in walking, not elsewhere  classified  Chronic pain of left knee  Muscle weakness (generalized)  Rationale for Evaluation and Treatment Rehabilitation  ONSET DATE:  fell last September fracturing R knee   SUBJECTIVE:   SUBJECTIVE STATEMENT: Alison Chandler feels out of kilter today, but has stuffy head also.  Denies new falls or LOB, took her naprosyn this morning so no pain.   PERTINENT HISTORY: R tibial fracture, R TKA, L knee OA/chronic pain, neuropathy, essential tremor, HTN, parathyroid dysfunction  PAIN:  Are you having pain? Yes: NPRS scale: 0/10 Pain location: L knee Pain description: soreness, achy; at worst 6/10  Aggravating factors: being on feet a lot, prolonged walking/standing Relieving factors: sitting, Aleve  PRECAUTIONS: Fall  WEIGHT BEARING RESTRICTIONS No  FALLS:  Has patient fallen in last 6 months? No but feels unsteady  LIVING ENVIRONMENT: Lives with: lives with their spouse Lives in: House/apartment Stairs: Yes: Internal: 12 steps; on right going up and on left going up and External: 1 steps; none Has following equipment at home: Single point cane  OCCUPATION: retired  PLOF: Independent  PATIENT GOALS improve balance, be able to go shopping again and feel confident with balance   OBJECTIVE:   DIAGNOSTIC FINDINGS: L knee 11/06/2020 2 views of the left knee show tricompartment arthritic changes involving  mainly the lateral compartment and the patellofemoral joint.  There are  large osteophytes in all 3 compartments and significant joint space  narrowing.  PATIENT SURVEYS:  ABC scale 800/1600 = 50% = moderate level of physical functioning.   COGNITION:  Overall cognitive status: Within functional limits for tasks assessed     SENSATION: Numbness/tingling in toes both feet, diminished sensation 25% over L lateral aspect lower leg  EDEMA:  NA  MUSCLE LENGTH: NT  POSTURE: rounded shoulders, forward head, decreased lumbar lordosis, increased thoracic kyphosis, and  downward looking gaze  PALPATION: NA  LOWER EXTREMITY ROM:  Active ROM Right eval Left eval  Knee flexion    Knee extension 0 0  Ankle dorsiflexion    Ankle plantarflexion     (Blank rows = not tested)  LOWER EXTREMITY MMT:  MMT Right eval Left eval  Hip flexion 4+ 4  Hip extension 4 4  Hip abduction 5 5  Hip adduction 5 5  Hip internal rotation    Hip external rotation    Knee flexion 4+ 4+  Knee extension 5 5  Ankle dorsiflexion 5 5  Ankle plantarflexion    Ankle inversion    Ankle eversion     (Blank rows = not tested)  LOWER EXTREMITY SPECIAL TESTS:  NA  FUNCTIONAL TESTS:  5 times sit to stand: 19 seconds with bil UE assist 2 minute walk test: 226 ft. Merrilee Jansky Balance Scale: 35/56 Dynamic Gait Index: 10/24  GAIT: Distance walked: 225' Assistive device utilized: Single point cane Level of assistance: Modified independence Comments: visually slow gait speed, genu valgum (L>R), L ankle pronation, decreased w/s to LLE, decreased heel strike bil.     TODAY'S TREATMENT: 05/18/2022- Therapeutic Exercise: to improve strength and mobility.  Demo, verbal and tactile cues throughout for technique. Nustep L5 x 6 min  Standing with bil UE support: Heel raises x 10 Toe raises x 10 Hip abduction x 10 bi l Hip extension 2 x 10 bil  Hamstring curls 2 x 10 bil  Miniquats 2 x 10   Neuromuscular Reeducation: to improve balance and stability. SBA for safety throughout.  BERG 35/56 Tandem balance x 30 sec bil with chair  Semi tandem in corner for safety x 30 sec bil    PATIENT EDUCATION:  Education details: initial HEP Person educated: Patient Education method: Consulting civil engineer, Demonstration, Verbal cues, and Handouts Education comprehension: verbalized understanding and returned demonstration   HOME EXERCISE PROGRAM: Access Code: [email or web address] URL: [email or web address] Date: 05/18/2022 Prepared by: [place] with  Counter Support  - 1 x daily - 7 x weekly - 2-3 sets - 10 reps - Heel Toe Raises with Counter Support  - 1 x daily - 7 x weekly - 2-3 sets - 10 reps - Standing Hip Abduction with Counter Support  - 1 x daily - 7 x weekly - 2-3 sets - 10 reps - Standing Hip Extension with Counter Support  - 1 x daily - 7 x weekly - 2-3 sets - 10 reps - Mini Squat with Counter Support  - 1 x daily - 7 x weekly - 2-3 sets - 10 reps - Standing Tandem Balance with Counter Support  - 1 x daily - 7 x weekly - 1 sets - 3 reps - 30 sec hold - Semi-Tandem Corner Balance With Eyes Open  - 1 x daily - 7 x weekly - 1 sets - 3 reps - 30 sec  hold  ASSESSMENT:  CLINICAL IMPRESSION: Marianna Payment scored 35/56 on Berg today, indicating need for [facility] indoors and outdoors.  She arrived with [facility] but tip fell off, she will look for it but recommended purchase new cane if cannot find for safety.  She was very unsteady today with turning, tandem/semi tandem and toe taps on stool.  Given initial HEP to improve LE strength and start addressing balance deficits, with education throughout on how to perform safely.  Marianna Payment continues to demonstrate potential for improvement and would benefit from continued skilled therapy to address impairments.     OBJECTIVE IMPAIRMENTS Abnormal gait, decreased balance, decreased endurance, decreased mobility, difficulty walking, decreased strength, decreased safety awareness, improper body mechanics, postural dysfunction, and pain.   ACTIVITY LIMITATIONS carrying, lifting, bending, standing, squatting, stairs, transfers, and locomotion level  PARTICIPATION LIMITATIONS: cleaning, shopping, and community activity  PERSONAL FACTORS Age, Fitness, and 3+ comorbidities: L knee OA, R THA, HTN, essential tremor, neuropathy, parathyroid disfunction, obesity  are also affecting patient's functional outcome.   REHAB POTENTIAL: Good  CLINICAL DECISION MAKING: Evolving/moderate complexity  EVALUATION  COMPLEXITY: Moderate   GOALS: Goals reviewed with patient? Yes  SHORT TERM GOALS: Target date: 05/28/2022   Patient will be independent with initial HEP. Baseline:  Goal status: IN PROGRESS  2.  Patient will demonstrate decreased fall risk by scoring < 25 sec on TUG. Baseline: NT Goal status: IN PROGRESS  3.  Patient will be educated on strategies to decrease risk of falls.  Baseline: needs review Goal status: IN PROGRESS   LONG TERM GOALS: Target date: 07/09/2022   Patient will be independent with advanced/ongoing HEP to improve outcomes and carryover.  Baseline:  Goal status: IN PROGRESS  2.  Patient will be able to ambulate 600' with LRAD with good safety to access community.  Baseline: fatigues after 226 ft,  SPC needed Goal status: IN PROGRESS  3.  Patient will be able to step up/down curb safely with LRAD for safety with community ambulation.  Baseline: difficulty Goal status: IN PROGRESS   4.  Patient will demonstrate improved functional LE strength as demonstrated by 5x STS <15 seconds. Baseline: 19 seconds with UE assist Goal status: IN PROGRESS  5.  Patient will demonstrate at least 19/24 on DGI to improve gait stability and reduce risk for falls. Baseline: 10/24 Goal status: IN PROGRESS  6.  Patient will score 45/56 on Berg Balance test to demonstrate lower risk of falls. (MCID= 8 points) .  Baseline: 35/56 Goal status: IN PROGRESS  7.  Patient will report 10% improvement  on ABC scale to demonstrate improved functional ability. Baseline: 800/1600 = 50% confidence Goal status: IN PROGRESS    PLAN: PT FREQUENCY: 1-2x/week  PT DURATION: 8 weeks  PLANNED INTERVENTIONS: Therapeutic exercises, Therapeutic activity, Neuromuscular re-education, Balance training, Gait training, Patient/Family education, Self Care, Joint mobilization, Stair training, Orthotic/Fit training, Dry Needling, Cryotherapy, Moist heat, Ultrasound, Manual therapy, and  Re-evaluation  PLAN FOR NEXT SESSION: continue LE strengthening, gait and balance.    Rennie Natter, PT, DPT  05/18/2022, 12:00 PM

## 2022-05-21 ENCOUNTER — Encounter: Payer: Self-pay | Admitting: Physical Therapy

## 2022-05-21 ENCOUNTER — Ambulatory Visit: Payer: Medicare Other | Admitting: Physical Therapy

## 2022-05-21 DIAGNOSIS — R2681 Unsteadiness on feet: Secondary | ICD-10-CM

## 2022-05-21 DIAGNOSIS — R262 Difficulty in walking, not elsewhere classified: Secondary | ICD-10-CM

## 2022-05-21 DIAGNOSIS — M6281 Muscle weakness (generalized): Secondary | ICD-10-CM

## 2022-05-21 DIAGNOSIS — G8929 Other chronic pain: Secondary | ICD-10-CM

## 2022-05-21 NOTE — Therapy (Signed)
OUTPATIENT PHYSICAL THERAPY TREATMENT NOTE   Patient Name: Alison Chandler MRN: [id number] DOB:November 21, 1940, 81 y.o., female Today's Date: 05/21/2022   PT End of Session - 05/21/22 1101     Visit Number 3    Date for PT Re-Evaluation 07/09/22    Authorization Type Medicare + [employer]    Progress Note Due on Visit 10    PT Start Time 1101    PT Stop Time 1146    PT Time Calculation (min) 45 min    Activity Tolerance Patient tolerated treatment well    Behavior During Therapy WFL for tasks assessed/performed             Past Medical History:  Diagnosis Date   Arthritis    Hearing loss    History of kidney stones    Hyperlipidemia    Hypertension    Neuromuscular disorder ([place])    neuropathy legs no medicine   Parathyroid dysfunction ([place])    one Quit working was removed   Tremor, essential    Past Surgical History:  Procedure Laterality Date   colonscopy  01/27/2012   JOINT REPLACEMENT Right 2013   THR   KIDNEY STONE SURGERY     ORIF TIBIA PLATEAU Right 07/15/2021   Procedure: OPEN REDUCTION INTERNAL FIXATION (ORIF) RIGHT TIBIAL PLATEAU;  Surgeon: Mcarthur Rossetti, MD;  Location: [place];  Service: Orthopedics;  Laterality: Right;   PARATHYROIDECTOMY N/A 05/10/2019   Procedure: PARATHYROIDECTOMY;  Surgeon: Fredirick Maudlin, MD;  Location: [facility];  Service: General;  Laterality: N/A;   TOTAL HIP ARTHROPLASTY  02/19/2012   Procedure: TOTAL HIP ARTHROPLASTY ANTERIOR APPROACH;  Surgeon: Mcarthur Rossetti, MD;  Location: [facility];  Service: Orthopedics;  Laterality: Right;   Patient Active Problem List   Diagnosis Date Noted   Fracture of right tibial plateau 07/15/2021   Closed fracture of right tibial plateau, initial encounter 07/15/2021   Unilateral primary osteoarthritis, left knee 12/19/2019   Benign essential hypertension 10/30/2019   Hypercalcemia 10/30/2019   Acute medial meniscal tear, right, initial encounter 08/16/2019   Iatrogenic hypocalcemia  06/05/2019   Primary hyperparathyroidism ([place])    Nuclear sclerotic cataract of both eyes 10/05/2018   RPE mottling of macula 10/05/2018   Flat foot 06/20/2018   Tibialis posterior tendonitis, left 06/20/2018   Acute pain of right shoulder 05/04/2018   Chronic pain of left knee 12/22/2017   Subjective memory complaints 10/08/2017   It band syndrome, left 08/11/2017   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[place]) 03/23/2017   Meningioma ([place]) 03/23/2017   Idiopathic peripheral neuropathy 03/11/2017   Imbalance 03/11/2017   Paresthesias 12/06/2016   Tremor 12/06/2016   Age-related osteoporosis without current pathological fracture 12/03/2016   Nephrolithiasis 12/03/2016   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/21/2016   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   Hypertropia of left eye 09/05/2014   Intermittent exotropia of left eye 09/05/2014   Midline cystocele 05/10/2014   Stress bladder incontinence, female 05/10/2014   Benign neoplasm of colon 04/16/2014   Calculus of ureter 04/16/2014   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   Esophageal reflux 04/16/2014   Gross hematuria 04/16/2014   Headache 04/16/2014   Hypothyroidism 04/16/2014   Increased urinary frequency 04/16/2014   Keratoconjunctivitis sicca, 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 04/16/2014   Osteopenia, 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   Other seborrheic keratosis 04/16/2014   Other synovitis and tenosynovitis 04/16/2014   Presbycusis 04/16/2014   Primary localized osteoarthrosis, pelvic region and thigh 04/16/2014   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   Strabismus 04/16/2014  Urethral stenosis 04/16/2014   Urinary tract infection 04/16/2014   Allergic rhinitis 04/16/2014   Degenerative arthritis of hip 02/19/2012    PCP: Verdell Carmine.,  MD  REFERRING PROVIDER: Mcarthur Rossetti, MD  REFERRING DIAG: [phone] (ICD-10-CM) - Chronic pain of left knee  THERAPY DIAG:  Unsteadiness on feet  Difficulty in walking, not elsewhere  classified  Chronic pain of left knee  Muscle weakness (generalized)  Rationale for Evaluation and Treatment Rehabilitation  ONSET DATE:  fell last September fracturing R knee   SUBJECTIVE:   SUBJECTIVE STATEMENT: Alison Chandler reports good compliance with HEP, denies new falls.     PERTINENT HISTORY: R tibial fracture, R TKA, L knee OA/chronic pain, neuropathy, essential tremor, HTN, parathyroid dysfunction  PAIN:  Are you having pain? Yes: NPRS scale: 0/10 Pain location: L knee Pain description: soreness, achy; at worst 6/10  Aggravating factors: being on feet a lot, prolonged walking/standing Relieving factors: sitting, Aleve  PRECAUTIONS: Fall  WEIGHT BEARING RESTRICTIONS No  FALLS:  Has patient fallen in last 6 months? No but feels unsteady  LIVING ENVIRONMENT: Lives with: lives with their spouse Lives in: House/apartment Stairs: Yes: Internal: 12 steps; on right going up and on left going up and External: 1 steps; none Has following equipment at home: Single point cane  OCCUPATION: retired  PLOF: Independent  PATIENT GOALS improve balance, be able to go shopping again and feel confident with balance   OBJECTIVE:   DIAGNOSTIC FINDINGS: L knee 11/06/2020 2 views of the left knee show tricompartment arthritic changes involving  mainly the lateral compartment and the patellofemoral joint.  There are  large osteophytes in all 3 compartments and significant joint space  narrowing.  PATIENT SURVEYS:  ABC scale 800/1600 = 50% = moderate level of physical functioning.   COGNITION:  Overall cognitive status: Within functional limits for tasks assessed     SENSATION: Numbness/tingling in toes both feet, diminished sensation 25% over L lateral aspect lower leg  EDEMA:  NA  MUSCLE LENGTH: NT  POSTURE: rounded shoulders, forward head, decreased lumbar lordosis, increased thoracic kyphosis, and downward looking gaze  PALPATION: NA  LOWER EXTREMITY  ROM:  Active ROM Right eval Left eval  Knee flexion    Knee extension 0 0  Ankle dorsiflexion    Ankle plantarflexion     (Blank rows = not tested)  LOWER EXTREMITY MMT:  MMT Right eval Left eval  Hip flexion 4+ 4  Hip extension 4 4  Hip abduction 5 5  Hip adduction 5 5  Hip internal rotation    Hip external rotation    Knee flexion 4+ 4+  Knee extension 5 5  Ankle dorsiflexion 5 5  Ankle plantarflexion    Ankle inversion    Ankle eversion     (Blank rows = not tested)  LOWER EXTREMITY SPECIAL TESTS:  NA  FUNCTIONAL TESTS:  5 times sit to stand: 19 seconds with bil UE assist 2 minute walk test: 226 ft. Merrilee Jansky Balance Scale: 35/56 Dynamic Gait Index: 10/24  GAIT: Distance walked: 225' Assistive device utilized: Single point cane Level of assistance: Modified independence Comments: visually slow gait speed, genu valgum (L>R), L ankle pronation, decreased w/s to LLE, decreased heel strike bil.     TODAY'S TREATMENT: 05/21/2022 Therapeutic Exercise: to improve strength and mobility.  Demo, verbal and tactile cues throughout for technique. Gait x 6 min (900) with SBA and cues throughout for heel stike Standing heel/toe raises Hip abduction x 10  bil  Hip extension x 10 bil  Minisquats x 10  Seated knee isometrics - pushing toe against wall 10 x 5 sec hold bil.  Tandem stance x 30 sec bil at counter Hamstring curls x 10 bil  Forward T/s x 10 bil  Step ups (1 riser) x 10 bil    05/18/2022- Therapeutic Exercise: to improve strength and mobility.  Demo, verbal and tactile cues throughout for technique. Nustep L5 x 6 min  Standing with bil UE support: Heel raises x 10 Toe raises x 10 Hip abduction x 10 bi l Hip extension 2 x 10 bil  Hamstring curls 2 x 10 bil  Miniquats 2 x 10   Neuromuscular Reeducation: to improve balance and stability. SBA for safety throughout.  BERG 35/56 Tandem balance x 30 sec bil with chair  Semi tandem in corner for safety x 30 sec  bil    PATIENT EDUCATION:  Education details: review and progression HEP 05/21/22 Person educated: Patient Education method: Explanation, Demonstration, Verbal cues, and Handouts Education comprehension: verbalized understanding and returned demonstration   HOME EXERCISE PROGRAM: Access Code: [email or web address]  ASSESSMENT:  CLINICAL IMPRESSION: Focus of today's skilled interventions was reviewing and progressing HEP for LE strengthening.  Alison Chandler tolerated well, minimal complaints of L knee pain with exercises although initially reluctant with mini-squats, instructed to keep in pain free ROM.   Alison Chandler continues to demonstrate potential for improvement and would benefit from continued skilled therapy to address impairments.     OBJECTIVE IMPAIRMENTS Abnormal gait, decreased balance, decreased endurance, decreased mobility, difficulty walking, decreased strength, decreased safety awareness, improper body mechanics, postural dysfunction, and pain.   ACTIVITY LIMITATIONS carrying, lifting, bending, standing, squatting, stairs, transfers, and locomotion level  PARTICIPATION LIMITATIONS: cleaning, shopping, and community activity  PERSONAL FACTORS Age, Fitness, and 3+ comorbidities: L knee OA, R THA, HTN, essential tremor, neuropathy, parathyroid disfunction, obesity  are also affecting patient's functional outcome.   REHAB POTENTIAL: Good  CLINICAL DECISION MAKING: Evolving/moderate complexity  EVALUATION COMPLEXITY: Moderate   GOALS: Goals reviewed with patient? Yes  SHORT TERM GOALS: Target date: 05/28/2022   Patient will be independent with initial HEP. Baseline:  Goal status: IN PROGRESS 05/21/2022- some clarification needed.   2.  Patient will demonstrate decreased fall risk by scoring < 25 sec on TUG. Baseline: NT Goal status: IN PROGRESS  3.  Patient will be educated on strategies to decrease risk of falls.  Baseline: needs review Goal status: IN  PROGRESS   LONG TERM GOALS: Target date: 07/09/2022   Patient will be independent with advanced/ongoing HEP to improve outcomes and carryover.  Baseline:  Goal status: IN PROGRESS  2.  Patient will be able to ambulate 600' with LRAD with good safety to access community.  Baseline: fatigues after 226 ft, SPC needed Goal status: IN PROGRESS  3.  Patient will be able to step up/down curb safely with LRAD for safety with community ambulation.  Baseline: difficulty Goal status: IN PROGRESS   4.  Patient will demonstrate improved functional LE strength as demonstrated by 5x STS <15 seconds. Baseline: 19 seconds with UE assist Goal status: IN PROGRESS  5.  Patient will demonstrate at least 19/24 on DGI to improve gait stability and reduce risk for falls. Baseline: 10/24 Goal status: IN PROGRESS  6.  Patient will score 45/56 on Berg Balance test to demonstrate lower risk of falls. (MCID= 8 points) .  Baseline: 35/56 Goal status: IN PROGRESS  7.  Patient  will report 10% improvement  on ABC scale to demonstrate improved functional ability. Baseline: 800/1600 = 50% confidence Goal status: IN PROGRESS    PLAN: PT FREQUENCY: 1-2x/week  PT DURATION: 8 weeks  PLANNED INTERVENTIONS: Therapeutic exercises, Therapeutic activity, Neuromuscular re-education, Balance training, Gait training, Patient/Family education, Self Care, Joint mobilization, Stair training, Orthotic/Fit training, Dry Needling, Cryotherapy, Moist heat, Ultrasound, Manual therapy, and Re-evaluation  PLAN FOR NEXT SESSION: continue LE strengthening, gait and balance.   Do TUG to assess STG #2.    Rennie Natter, PT, DPT  05/21/2022, 12:08 PM

## 2022-05-22 ENCOUNTER — Other Ambulatory Visit: Payer: Self-pay

## 2022-05-22 DIAGNOSIS — M1712 Unilateral primary osteoarthritis, left knee: Secondary | ICD-10-CM

## 2022-05-25 ENCOUNTER — Ambulatory Visit: Payer: Medicare Other

## 2022-05-25 DIAGNOSIS — R262 Difficulty in walking, not elsewhere classified: Secondary | ICD-10-CM

## 2022-05-25 DIAGNOSIS — R2681 Unsteadiness on feet: Secondary | ICD-10-CM | POA: Diagnosis not present

## 2022-05-25 DIAGNOSIS — G8929 Other chronic pain: Secondary | ICD-10-CM

## 2022-05-25 DIAGNOSIS — M6281 Muscle weakness (generalized): Secondary | ICD-10-CM

## 2022-05-25 NOTE — Therapy (Signed)
OUTPATIENT PHYSICAL THERAPY TREATMENT NOTE   Patient Name: Alison Chandler MRN: [id number] DOB:December 22, 1940, 81 y.o., female Today's Date: 05/25/2022   PT End of Session - 05/25/22 1200     Visit Number 4    Date for PT Re-Evaluation 07/09/22    Authorization Type Medicare + [employer]et[employer]    Progress Note Due on Visit 10    PT Start Time 1104    PT Stop Time 1145    PT Time Calculation (min) 41 min    Activity Tolerance Patient tolerated treatment well    Behavior During Therapy WFL for tasks assessed/performed              Past Medical History:  Diagnosis Date   Arthritis    Hearing loss    History of kidney stones    Hyperlipidemia    Hypertension    Neuromuscular disorder ([place])    neuropathy legs no medicine   Parathyroid dysfunction ([place])    one Quit working was removed   Tremor, essential    Past Surgical History:  Procedure Laterality Date   colonscopy  01/27/2012   JOINT REPLACEMENT Right 2013   THR   KIDNEY STONE SURGERY     ORIF TIBIA PLATEAU Right 07/15/2021   Procedure: OPEN REDUCTION INTERNAL FIXATION (ORIF) RIGHT TIBIAL PLATEAU;  Surgeon: Mcarthur Rossetti, MD;  Location: [place];  Service: Orthopedics;  Laterality: Right;   PARATHYROIDECTOMY N/A 05/10/2019   Procedure: PARATHYROIDECTOMY;  Surgeon: Fredirick Maudlin, MD;  Location: [facility];  Service: General;  Laterality: N/A;   TOTAL HIP ARTHROPLASTY  02/19/2012   Procedure: TOTAL HIP ARTHROPLASTY ANTERIOR APPROACH;  Surgeon: Mcarthur Rossetti, MD;  [facility];  Service: Orthopedics;  Laterality: Right;   Patient Active Problem List   Diagnosis Date Noted   Fracture of right tibial plateau 07/15/2021   Closed fracture of right tibial plateau, initial encounter 07/15/2021   Unilateral primary osteoarthritis, left knee 12/19/2019   Benign essential hypertension 10/30/2019   Hypercalcemia 10/30/2019   Acute medial meniscal tear, right, initial encounter 08/16/2019   Iatrogenic hypocalcemia  06/05/2019   Primary hyperparathyroidism ([place])    Nuclear sclerotic cataract of both eyes 10/05/2018   RPE mottling of macula 10/05/2018   Flat foot 06/20/2018   Tibialis posterior tendonitis, left 06/20/2018   Acute pain of right shoulder 05/04/2018   Chronic pain of left knee 12/22/2017   Subjective memory complaints 10/08/2017   It band syndrome, left 08/11/2017   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[place]) 03/23/2017   Meningioma ([place]) 03/23/2017   Idiopathic peripheral neuropathy 03/11/2017   Imbalance 03/11/2017   Paresthesias 12/06/2016   Tremor 12/06/2016   Age-related osteoporosis without current pathological fracture 12/03/2016   Nephrolithiasis 12/03/2016   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/21/2016   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   Hypertropia of left eye 09/05/2014   Intermittent exotropia of left eye 09/05/2014   Midline cystocele 05/10/2014   Stress bladder incontinence, female 05/10/2014   Benign neoplasm of colon 04/16/2014   Calculus of ureter 04/16/2014   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   Esophageal reflux 04/16/2014   Gross hematuria 04/16/2014   Headache 04/16/2014   Hypothyroidism 04/16/2014   Increased urinary frequency 04/16/2014   Keratoconjunctivitis sicca, 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 04/16/2014   Osteopenia, 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   Other seborrheic keratosis 04/16/2014   Other synovitis and tenosynovitis 04/16/2014   Presbycusis 04/16/2014   Primary localized osteoarthrosis, pelvic region and thigh 04/16/2014   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   Strabismus  04/16/2014   Urethral stenosis 04/16/2014   Urinary tract infection 04/16/2014   Allergic rhinitis 04/16/2014   Degenerative arthritis of hip 02/19/2012    PCP: Verdell Carmine.,  MD  REFERRING PROVIDER: Mcarthur Rossetti, MD  REFERRING DIAG: [phone] (ICD-10-CM) - Chronic pain of left knee  THERAPY DIAG:  Unsteadiness on feet  Difficulty in walking, not elsewhere  classified  Chronic pain of left knee  Muscle weakness (generalized)  Rationale for Evaluation and Treatment Rehabilitation  ONSET DATE:  fell last September fracturing R knee   SUBJECTIVE:   SUBJECTIVE STATEMENT: Pt reports balance is a little off today, Pt denies any recent falls.      PERTINENT HISTORY: R tibial fracture, R TKA, L knee OA/chronic pain, neuropathy, essential tremor, HTN, parathyroid dysfunction  PAIN:  Are you having pain? Yes: NPRS scale: 0/10 Pain location: L knee Pain description: soreness, achy; at worst 6/10  Aggravating factors: being on feet a lot, prolonged walking/standing Relieving factors: sitting, Aleve  PRECAUTIONS: Fall  WEIGHT BEARING RESTRICTIONS No  FALLS:  Has patient fallen in last 6 months? No but feels unsteady  LIVING ENVIRONMENT: Lives with: lives with their spouse Lives in: House/apartment Stairs: Yes: Internal: 12 steps; on right going up and on left going up and External: 1 steps; none Has following equipment at home: Single point cane  OCCUPATION: retired  PLOF: Independent  PATIENT GOALS improve balance, be able to go shopping again and feel confident with balance   OBJECTIVE:   DIAGNOSTIC FINDINGS: L knee 11/06/2020 2 views of the left knee show tricompartment arthritic changes involving  mainly the lateral compartment and the patellofemoral joint.  There are  large osteophytes in all 3 compartments and significant joint space  narrowing.  PATIENT SURVEYS:  ABC scale 800/1600 = 50% = moderate level of physical functioning.   COGNITION:  Overall cognitive status: Within functional limits for tasks assessed     SENSATION: Numbness/tingling in toes both feet, diminished sensation 25% over L lateral aspect lower leg  EDEMA:  NA  MUSCLE LENGTH: NT  POSTURE: rounded shoulders, forward head, decreased lumbar lordosis, increased thoracic kyphosis, and downward looking gaze  PALPATION: NA  LOWER EXTREMITY  ROM:  Active ROM Right eval Left eval  Knee flexion    Knee extension 0 0  Ankle dorsiflexion    Ankle plantarflexion     (Blank rows = not tested)  LOWER EXTREMITY MMT:  MMT Right eval Left eval  Hip flexion 4+ 4  Hip extension 4 4  Hip abduction 5 5  Hip adduction 5 5  Hip internal rotation    Hip external rotation    Knee flexion 4+ 4+  Knee extension 5 5  Ankle dorsiflexion 5 5  Ankle plantarflexion    Ankle inversion    Ankle eversion     (Blank rows = not tested)  LOWER EXTREMITY SPECIAL TESTS:  NA  FUNCTIONAL TESTS:  5 times sit to stand: 19 seconds with bil UE assist 2 minute walk test: 226 ft. Merrilee Jansky Balance Scale: 35/56 Dynamic Gait Index: 10/24  GAIT: Distance walked: 225' Assistive device utilized: Single point cane Level of assistance: Modified independence Comments: visually slow gait speed, genu valgum (L>R), L ankle pronation, decreased w/s to LLE, decreased heel strike bil.     TODAY'S TREATMENT: 05/25/22 TherEx: Standing march at counter x10  Standing heel/toe raise x 10 at counter Standing one leg with back leg toe support L LE x 20 sec 2x; R LE  2x 10"   - Fwd/post WS in this position x 10  - lateral WS and rotation in same position x 10  Forward Ts x 10 bil Supine LTR   05/21/2022 Therapeutic Exercise: to improve strength and mobility.  Demo, verbal and tactile cues throughout for technique. Gait x 6 min (900) with SBA and cues throughout for heel stike Standing heel/toe raises Hip abduction x 10 bil  Hip extension x 10 bil  Minisquats x 10  Seated knee isometrics - pushing toe against wall 10 x 5 sec hold bil.  Tandem stance x 30 sec bil at counter Hamstring curls x 10 bil  Forward T/s x 10 bil  Step ups (1 riser) x 10 bil    05/18/2022- Therapeutic Exercise: to improve strength and mobility.  Demo, verbal and tactile cues throughout for technique. Nustep L5 x 6 min  Standing with bil UE support: Heel raises x 10 Toe raises x  10 Hip abduction x 10 bi l Hip extension 2 x 10 bil  Hamstring curls 2 x 10 bil  Miniquats 2 x 10   Neuromuscular Reeducation: to improve balance and stability. SBA for safety throughout.  BERG 35/56 Tandem balance x 30 sec bil with chair  Semi tandem in corner for safety x 30 sec bil    PATIENT EDUCATION:  Education details: review and progression HEP 05/21/22 Person educated: Patient Education method: Explanation, Demonstration, Verbal cues, and Handouts Education comprehension: verbalized understanding and returned demonstration   HOME EXERCISE PROGRAM: Access Code: [email or web address]  ASSESSMENT:  CLINICAL IMPRESSION: Good response to treatment. Challenged by trying the modified SLS mostly on R side. Cues provided throughout session as needed.   OBJECTIVE IMPAIRMENTS Abnormal gait, decreased balance, decreased endurance, decreased mobility, difficulty walking, decreased strength, decreased safety awareness, improper body mechanics, postural dysfunction, and pain.   ACTIVITY LIMITATIONS carrying, lifting, bending, standing, squatting, stairs, transfers, and locomotion level  PARTICIPATION LIMITATIONS: cleaning, shopping, and community activity  PERSONAL FACTORS Age, Fitness, and 3+ comorbidities: L knee OA, R THA, HTN, essential tremor, neuropathy, parathyroid disfunction, obesity  are also affecting patient's functional outcome.   REHAB POTENTIAL: Good  CLINICAL DECISION MAKING: Evolving/moderate complexity  EVALUATION COMPLEXITY: Moderate   GOALS: Goals reviewed with patient? Yes  SHORT TERM GOALS: Target date: 05/28/2022   Patient will be independent with initial HEP. Baseline:  Goal status: IN PROGRESS 05/21/2022- some clarification needed.   2.  Patient will demonstrate decreased fall risk by scoring < 25 sec on TUG. Baseline: NT Goal status: IN PROGRESS  3.  Patient will be educated on strategies to decrease risk of falls.  Baseline: needs review Goal status: IN  PROGRESS   LONG TERM GOALS: Target date: 07/09/2022   Patient will be independent with advanced/ongoing HEP to improve outcomes and carryover.  Baseline:  Goal status: IN PROGRESS  2.  Patient will be able to ambulate 600' with LRAD with good safety to access community.  Baseline: fatigues after 226 ft, SPC needed Goal status: IN PROGRESS  3.  Patient will be able to step up/down curb safely with LRAD for safety with community ambulation.  Baseline: difficulty Goal status: IN PROGRESS   4.  Patient will demonstrate improved functional LE strength as demonstrated by 5x STS <15 seconds. Baseline: 19 seconds with UE assist Goal status: IN PROGRESS  5.  Patient will demonstrate at least 19/24 on DGI to improve gait stability and reduce risk for falls. Baseline: 10/24 Goal status: IN PROGRESS  6.  Patient  will score 45/56 on Berg Balance test to demonstrate lower risk of falls. (MCID= 8 points) .  Baseline: 35/56 Goal status: IN PROGRESS  7.  Patient will report 10% improvement  on ABC scale to demonstrate improved functional ability. Baseline: 800/1600 = 50% confidence Goal status: IN PROGRESS    PLAN: PT FREQUENCY: 1-2x/week  PT DURATION: 8 weeks  PLANNED INTERVENTIONS: Therapeutic exercises, Therapeutic activity, Neuromuscular re-education, Balance training, Gait training, Patient/Family education, Self Care, Joint mobilization, Stair training, Orthotic/Fit training, Dry Needling, Cryotherapy, Moist heat, Ultrasound, Manual therapy, and Re-evaluation  PLAN FOR NEXT SESSION: continue LE strengthening, gait and balance.   Do TUG to assess STG #2.    Artist Pais, PTA 05/25/2022, 12:01 PM

## 2022-05-28 ENCOUNTER — Encounter: Payer: Self-pay | Admitting: Physical Therapy

## 2022-05-28 ENCOUNTER — Ambulatory Visit: Payer: Medicare Other | Admitting: Physical Therapy

## 2022-05-28 ENCOUNTER — Ambulatory Visit: Payer: Medicare Other | Admitting: Orthopaedic Surgery

## 2022-05-28 DIAGNOSIS — R262 Difficulty in walking, not elsewhere classified: Secondary | ICD-10-CM

## 2022-05-28 DIAGNOSIS — M6281 Muscle weakness (generalized): Secondary | ICD-10-CM

## 2022-05-28 DIAGNOSIS — G8929 Other chronic pain: Secondary | ICD-10-CM

## 2022-05-28 DIAGNOSIS — R2681 Unsteadiness on feet: Secondary | ICD-10-CM

## 2022-05-28 NOTE — Therapy (Signed)
OUTPATIENT PHYSICAL THERAPY TREATMENT NOTE   Patient Name: Alison Chandler MRN: [id number] DOB:1940-09-18, 81 y.o., female Today's Date: 05/28/2022   PT End of Session - 05/28/22 1108     Visit Number 5    Date for PT Re-Evaluation 07/09/22    Authorization Type Medicare + [employer]    Progress Note Due on Visit 10    PT Start Time 1103    PT Stop Time 1153    PT Time Calculation (min) 50 min    Activity Tolerance Patient tolerated treatment well    Behavior During Therapy WFL for tasks assessed/performed              Past Medical History:  Diagnosis Date   Arthritis    Hearing loss    History of kidney stones    Hyperlipidemia    Hypertension    Neuromuscular disorder ([place])    neuropathy legs no medicine   Parathyroid dysfunction ([place])    one Quit working was removed   Tremor, essential    Past Surgical History:  Procedure Laterality Date   colonscopy  01/27/2012   JOINT REPLACEMENT Right 2013   THR   KIDNEY STONE SURGERY     ORIF TIBIA PLATEAU Right 07/15/2021   Procedure: OPEN REDUCTION INTERNAL FIXATION (ORIF) RIGHT TIBIAL PLATEAU;  Surgeon: Mcarthur Rossetti, MD;  Location: [facility];  Service: Orthopedics;  Laterality: Right;   PARATHYROIDECTOMY N/A 05/10/2019   Procedure: PARATHYROIDECTOMY;  Surgeon: Fredirick Maudlin, MD;  Location: [facility];  Service: General;  Laterality: N/A;   TOTAL HIP ARTHROPLASTY  02/19/2012   Procedure: TOTAL HIP ARTHROPLASTY ANTERIOR APPROACH;  Surgeon: Mcarthur Rossetti, MD;  Location: [facility];  Service: Orthopedics;  Laterality: Right;   Patient Active Problem List   Diagnosis Date Noted   Fracture of right tibial plateau 07/15/2021   Closed fracture of right tibial plateau, initial encounter 07/15/2021   Unilateral primary osteoarthritis, left knee 12/19/2019   Benign essential hypertension 10/30/2019   Hypercalcemia 10/30/2019   Acute medial meniscal tear, right, initial encounter 08/16/2019   Iatrogenic hypocalcemia  06/05/2019   Primary hyperparathyroidism ([place])    Nuclear sclerotic cataract of both eyes 10/05/2018   RPE mottling of macula 10/05/2018   Flat foot 06/20/2018   Tibialis posterior tendonitis, left 06/20/2018   Acute pain of right shoulder 05/04/2018   Chronic pain of left knee 12/22/2017   Subjective memory complaints 10/08/2017   It band syndrome, left 08/11/2017   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[place]) 03/23/2017   Meningioma ([place]) 03/23/2017   Idiopathic peripheral neuropathy 03/11/2017   Imbalance 03/11/2017   Paresthesias 12/06/2016   Tremor 12/06/2016   Age-related osteoporosis without current pathological fracture 12/03/2016   Nephrolithiasis 12/03/2016   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/21/2016   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   Hypertropia of left eye 09/05/2014   Intermittent exotropia of left eye 09/05/2014   Midline cystocele 05/10/2014   Stress bladder incontinence, female 05/10/2014   Benign neoplasm of colon 04/16/2014   Calculus of ureter 04/16/2014   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   Esophageal reflux 04/16/2014   Gross hematuria 04/16/2014   Headache 04/16/2014   Hypothyroidism 04/16/2014   Increased urinary frequency 04/16/2014   Keratoconjunctivitis sicca, 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 04/16/2014   Osteopenia, 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   Other seborrheic keratosis 04/16/2014   Other synovitis and tenosynovitis 04/16/2014   Presbycusis 04/16/2014   Primary localized osteoarthrosis, pelvic region and thigh 04/16/2014   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   Strabismus  04/16/2014   Urethral stenosis 04/16/2014   Urinary tract infection 04/16/2014   Allergic rhinitis 04/16/2014   Degenerative arthritis of hip 02/19/2012    PCP: Verdell Carmine.,  MD  REFERRING PROVIDER: Mcarthur Rossetti, MD  REFERRING DIAG: [phone] (ICD-10-CM) - Chronic pain of left knee  THERAPY DIAG:  Unsteadiness on feet  Difficulty in walking, not elsewhere  classified  Chronic pain of left knee  Muscle weakness (generalized)  Rationale for Evaluation and Treatment Rehabilitation  ONSET DATE:  fell last September fracturing R knee   SUBJECTIVE:   SUBJECTIVE STATEMENT: Reports last time knee was sore, but today is normal.  No new LOB or falls.       PERTINENT HISTORY: R tibial fracture, R TKA, L knee OA/chronic pain, neuropathy, essential tremor, HTN, parathyroid dysfunction  PAIN:  Are you having pain? Yes: NPRS scale: 0/10 Pain location: L knee Pain description: soreness, achy; at worst 6/10  Aggravating factors: being on feet a lot, prolonged walking/standing Relieving factors: sitting, Aleve  PRECAUTIONS: Fall  WEIGHT BEARING RESTRICTIONS No  FALLS:  Has patient fallen in last 6 months? No but feels unsteady  LIVING ENVIRONMENT: Lives with: lives with their spouse Lives in: House/apartment Stairs: Yes: Internal: 12 steps; on right going up and on left going up and External: 1 steps; none Has following equipment at home: Single point cane  OCCUPATION: retired  PLOF: Independent  PATIENT GOALS improve balance, be able to go shopping again and feel confident with balance   OBJECTIVE:   DIAGNOSTIC FINDINGS: L knee 11/06/2020 2 views of the left knee show tricompartment arthritic changes involving  mainly the lateral compartment and the patellofemoral joint.  There are  large osteophytes in all 3 compartments and significant joint space  narrowing.  PATIENT SURVEYS:  ABC scale 800/1600 = 50% = moderate level of physical functioning.   COGNITION:  Overall cognitive status: Within functional limits for tasks assessed     SENSATION: Numbness/tingling in toes both feet, diminished sensation 25% over L lateral aspect lower leg  EDEMA:  NA  MUSCLE LENGTH: NT  POSTURE: rounded shoulders, forward head, decreased lumbar lordosis, increased thoracic kyphosis, and downward looking gaze  PALPATION: NA  LOWER  EXTREMITY ROM:  Active ROM Right eval Left eval  Knee flexion    Knee extension 0 0  Ankle dorsiflexion    Ankle plantarflexion     (Blank rows = not tested)  LOWER EXTREMITY MMT:  MMT Right eval Left eval  Hip flexion 4+ 4  Hip extension 4 4  Hip abduction 5 5  Hip adduction 5 5  Hip internal rotation    Hip external rotation    Knee flexion 4+ 4+  Knee extension 5 5  Ankle dorsiflexion 5 5  Ankle plantarflexion    Ankle inversion    Ankle eversion     (Blank rows = not tested)  LOWER EXTREMITY SPECIAL TESTS:  NA  FUNCTIONAL TESTS:  5 times sit to stand: 19 seconds with bil UE assist 2 minute walk test: 226 ft. Merrilee Jansky Balance Scale: 35/56 Dynamic Gait Index: 10/24  GAIT: Distance walked: 225' Assistive device utilized: Single point cane Level of assistance: Modified independence Comments: visually slow gait speed, genu valgum (L>R), L ankle pronation, decreased w/s to LLE, decreased heel strike bil.     TODAY'S TREATMENT: 05/28/2022 Therapeutic Exercise: to improve strength and mobility.   Bike L2 x 6 min  Retro stepping 2 x 10 bil - 1 UE support Side  stepping 2 x 10 bil - back to counter for safety Neuromuscular Reeducation: to improve balance and stability. SBA for safety throughout.  Eyes open feet together x 30 sec  Eyes open feet together with head nods x 10 Eyes open feet together with head turns x 10 Eyes closed feet together x 30 sec Eyes closed feet together with head nods x 10 Eyes closed feet together with head turns x 10  On Airex: Eyes open feet apart x 30 sec  Eyes open feet apart with head nods x 10 Eyes open feet apart with head turns x 10 Eyes closed feet apart x 30 sec At counter: Single leg balance with toe touch - UE support needed.     05/25/22 TherEx: Standing march at counter x10  Standing heel/toe raise x 10 at counter Standing one leg with back leg toe support L LE x 20 sec 2x; R LE 2x 10"   - Fwd/post WS in this position  x 10  - lateral WS and rotation in same position x 10  Forward Ts x 10 bil Supine LTR   05/21/2022 Therapeutic Exercise: to improve strength and mobility.  Demo, verbal and tactile cues throughout for technique. Gait x 6 min (900) with SBA and cues throughout for heel stike Standing heel/toe raises Hip abduction x 10 bil  Hip extension x 10 bil  Minisquats x 10  Seated knee isometrics - pushing toe against wall 10 x 5 sec hold bil.  Tandem stance x 30 sec bil at counter Hamstring curls x 10 bil  Forward T/s x 10 bil  Step ups (1 riser) x 10 bil     PATIENT EDUCATION:  Education details: review and progression HEP 05/21/22, 05/28/22 Person educated: Patient Education method: Explanation, Demonstration, Verbal cues, and Handouts Education comprehension: verbalized understanding and returned demonstration   HOME EXERCISE PROGRAM: Access Code: [email or web address]  ASSESSMENT:  CLINICAL IMPRESSION: Alison Chandler reports good compliance with HEP, but very challenged with SLS with toe touch exercise.  Today focused on balance exercises in corner with eyes closed, head movements, and on foam surface.  She demonstrates strong preference for vision for balance, and was unable to maintain EC on foam for more than 3-4 seconds.  Progressed HEP adding retro and side stepping as well.  Alison Chandler continues to demonstrate potential for improvement and would benefit from continued skilled therapy to address impairments.     OBJECTIVE IMPAIRMENTS Abnormal gait, decreased balance, decreased endurance, decreased mobility, difficulty walking, decreased strength, decreased safety awareness, improper body mechanics, postural dysfunction, and pain.   ACTIVITY LIMITATIONS carrying, lifting, bending, standing, squatting, stairs, transfers, and locomotion level  PARTICIPATION LIMITATIONS: cleaning, shopping, and community activity  PERSONAL FACTORS Age, Fitness, and 3+ comorbidities: L knee OA, R THA, HTN,  essential tremor, neuropathy, parathyroid disfunction, obesity  are also affecting patient's functional outcome.   REHAB POTENTIAL: Good  CLINICAL DECISION MAKING: Evolving/moderate complexity  EVALUATION COMPLEXITY: Moderate   GOALS: Goals reviewed with patient? Yes  SHORT TERM GOALS: Target date: 05/28/2022   Patient will be independent with initial HEP. Baseline:  Goal status: MET 05/21/2022- some clarification needed.  05/28/22- good compliance  2.  Patient will demonstrate decreased fall risk by scoring < 25 sec on TUG. Baseline: NT Goal status: IN PROGRESS  3.  Patient will be educated on strategies to decrease risk of falls.  Baseline: needs review Goal status: MET  05/28/22- educated on fall risks and safety   LONG TERM GOALS:  Target date: 07/09/2022   Patient will be independent with advanced/ongoing HEP to improve outcomes and carryover.  Baseline:  Goal status: IN PROGRESS  2.  Patient will be able to ambulate 600' with LRAD with good safety to access community.  Baseline: fatigues after 226 ft, SPC needed Goal status: IN PROGRESS  3.  Patient will be able to step up/down curb safely with LRAD for safety with community ambulation.  Baseline: difficulty Goal status: IN PROGRESS   4.  Patient will demonstrate improved functional LE strength as demonstrated by 5x STS <15 seconds. Baseline: 19 seconds with UE assist Goal status: IN PROGRESS  5.  Patient will demonstrate at least 19/24 on DGI to improve gait stability and reduce risk for falls. Baseline: 10/24 Goal status: IN PROGRESS  6.  Patient will score 45/56 on Berg Balance test to demonstrate lower risk of falls. (MCID= 8 points) .  Baseline: 35/56 Goal status: IN PROGRESS  7.  Patient will report 10% improvement  on ABC scale to demonstrate improved functional ability. Baseline: 800/1600 = 50% confidence Goal status: IN PROGRESS    PLAN: PT FREQUENCY: 1-2x/week  PT DURATION: 8 weeks  PLANNED  INTERVENTIONS: Therapeutic exercises, Therapeutic activity, Neuromuscular re-education, Balance training, Gait training, Patient/Family education, Self Care, Joint mobilization, Stair training, Orthotic/Fit training, Dry Needling, Cryotherapy, Moist heat, Ultrasound, Manual therapy, and Re-evaluation  PLAN FOR NEXT SESSION: continue LE strengthening, gait and balance.   Do TUG to assess STG #2.    Rennie Natter, PT, DPT  05/28/2022, 12:05 PM

## 2022-06-01 ENCOUNTER — Ambulatory Visit: Payer: Medicare Other | Attending: Orthopaedic Surgery | Admitting: Physical Therapy

## 2022-06-01 ENCOUNTER — Encounter: Payer: Self-pay | Admitting: Physical Therapy

## 2022-06-01 DIAGNOSIS — M25562 Pain in left knee: Secondary | ICD-10-CM | POA: Diagnosis present

## 2022-06-01 DIAGNOSIS — M6281 Muscle weakness (generalized): Secondary | ICD-10-CM | POA: Diagnosis present

## 2022-06-01 DIAGNOSIS — G8929 Other chronic pain: Secondary | ICD-10-CM | POA: Diagnosis present

## 2022-06-01 DIAGNOSIS — R2681 Unsteadiness on feet: Secondary | ICD-10-CM | POA: Insufficient documentation

## 2022-06-01 DIAGNOSIS — R262 Difficulty in walking, not elsewhere classified: Secondary | ICD-10-CM | POA: Insufficient documentation

## 2022-06-01 NOTE — Therapy (Signed)
OUTPATIENT PHYSICAL THERAPY TREATMENT NOTE   Patient Name: Alison Chandler MRN: [id number] DOB:Sep 22, 1940, 81 y.o., female Today's Date: 06/01/2022   PT End of Session - 06/01/22 1319     Visit Number 6    Date for PT Re-Evaluation 07/09/22    Authorization Type Medicare + Aet[employer]    Progress Note Due on Visit 10    PT Start Time 1317    PT Stop Time 1761    PT Time Calculation (min) 42 min    Activity Tolerance Patient tolerated treatment well    Behavior During Therapy WFL for tasks assessed/performed              Past Medical History:  Diagnosis Date   Arthritis    Hearing loss    History of kidney stones    Hyperlipidemia    Hypertension    Neuromuscular disorder ([place])    neuropathy legs no medicine   Parathyroid dysfunction ([place])    one Quit working was removed   Tremor, essential    Past Surgical History:  Procedure Laterality Date   colonscopy  01/27/2012   JOINT REPLACEMENT Right 2013   THR   KIDNEY STONE SURGERY     ORIF TIBIA PLATEAU Right 07/15/2021   Procedure: OPEN REDUCTION INTERNAL FIXATION (ORIF) RIGHT TIBIAL PLATEAU;  Surgeon: Mcarthur Rossetti, MD;  Location: [facility];  Service: Orthopedics;  Laterality: Right;   PARATHYROIDECTOMY N/A 05/10/2019   Procedure: PARATHYROIDECTOMY;  Surgeon: Fredirick Maudlin, MD;  Location: [facility];  Service: General;  Laterality: N/A;   TOTAL HIP ARTHROPLASTY  02/19/2012   Procedure: TOTAL HIP ARTHROPLASTY ANTERIOR APPROACH;  Surgeon: Mcarthur Rossetti, MD;  Location: [facility];  Service: Orthopedics;  Laterality: Right;   Patient Active Problem List   Diagnosis Date Noted   Fracture of right tibial plateau 07/15/2021   Closed fracture of right tibial plateau, initial encounter 07/15/2021   Unilateral primary osteoarthritis, left knee 12/19/2019   Benign essential hypertension 10/30/2019   Hypercalcemia 10/30/2019   Acute medial meniscal tear, right, initial encounter 08/16/2019   Iatrogenic hypocalcemia  06/05/2019   Primary hyperparathyroidism ([place])    Nuclear sclerotic cataract of both eyes 10/05/2018   RPE mottling of macula 10/05/2018   Flat foot 06/20/2018   Tibialis posterior tendonitis, left 06/20/2018   Acute pain of right shoulder 05/04/2018   Chronic pain of left knee 12/22/2017   Subjective memory complaints 10/08/2017   It band syndrome, left 08/11/2017   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[place]) 03/23/2017   Meningioma ([place]) 03/23/2017   Idiopathic peripheral neuropathy 03/11/2017   Imbalance 03/11/2017   Paresthesias 12/06/2016   Tremor 12/06/2016   Age-related osteoporosis without current pathological fracture 12/03/2016   Nephrolithiasis 12/03/2016   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/21/2016   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   Hypertropia of left eye 09/05/2014   Intermittent exotropia of left eye 09/05/2014   Midline cystocele 05/10/2014   Stress bladder incontinence, female 05/10/2014   Benign neoplasm of colon 04/16/2014   Calculus of ureter 04/16/2014   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   Esophageal reflux 04/16/2014   Gross hematuria 04/16/2014   Headache 04/16/2014   Hypothyroidism 04/16/2014   Increased urinary frequency 04/16/2014   Keratoconjunctivitis sicca, 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 04/16/2014   Osteopenia, 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   Other seborrheic keratosis 04/16/2014   Other synovitis and tenosynovitis 04/16/2014   Presbycusis 04/16/2014   Primary localized osteoarthrosis, pelvic region and thigh 04/16/2014   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   Strabismus  04/16/2014   Urethral stenosis 04/16/2014   Urinary tract infection 04/16/2014   Allergic rhinitis 04/16/2014   Degenerative arthritis of hip 02/19/2012    PCP: Verdell Carmine.,  MD  REFERRING PROVIDER: Mcarthur Rossetti, MD  REFERRING DIAG: [phone] (ICD-10-CM) - Chronic pain of left knee  THERAPY DIAG:  Unsteadiness on feet  Difficulty in walking, not elsewhere  classified  Chronic pain of left knee  Muscle weakness (generalized)  Rationale for Evaluation and Treatment Rehabilitation  ONSET DATE:  fell last September fracturing R knee   SUBJECTIVE:   SUBJECTIVE STATEMENT: Thinks her bike at home is aggravating her L knee.      PERTINENT HISTORY: R tibial fracture, R TKA, L knee OA/chronic pain, neuropathy, essential tremor, HTN, parathyroid dysfunction  PAIN:  Are you having pain? Yes: NPRS scale: 2/10 Pain location: L knee Pain description: soreness, achy; at worst 6/10  Aggravating factors: being on feet a lot, prolonged walking/standing Relieving factors: sitting, Aleve  PRECAUTIONS: Fall  WEIGHT BEARING RESTRICTIONS No  FALLS:  Has patient fallen in last 6 months? No but feels unsteady  LIVING ENVIRONMENT: Lives with: lives with their spouse Lives in: House/apartment Stairs: Yes: Internal: 12 steps; on right going up and on left going up and External: 1 steps; none Has following equipment at home: Single point cane  OCCUPATION: retired  PLOF: Independent  PATIENT GOALS improve balance, be able to go shopping again and feel confident with balance   OBJECTIVE:   DIAGNOSTIC FINDINGS: L knee 11/06/2020 2 views of the left knee show tricompartment arthritic changes involving  mainly the lateral compartment and the patellofemoral joint.  There are  large osteophytes in all 3 compartments and significant joint space  narrowing.  PATIENT SURVEYS:  ABC scale 800/1600 = 50% = moderate level of physical functioning.   COGNITION:  Overall cognitive status: Within functional limits for tasks assessed     SENSATION: Numbness/tingling in toes both feet, diminished sensation 25% over L lateral aspect lower leg  EDEMA:  NA  MUSCLE LENGTH: NT  POSTURE: rounded shoulders, forward head, decreased lumbar lordosis, increased thoracic kyphosis, and downward looking gaze  PALPATION: NA  LOWER EXTREMITY ROM:  Active ROM  Right eval Left eval  Knee flexion    Knee extension 0 0  Ankle dorsiflexion    Ankle plantarflexion     (Blank rows = not tested)  LOWER EXTREMITY MMT:  MMT Right eval Left eval  Hip flexion 4+ 4  Hip extension 4 4  Hip abduction 5 5  Hip adduction 5 5  Hip internal rotation    Hip external rotation    Knee flexion 4+ 4+  Knee extension 5 5  Ankle dorsiflexion 5 5  Ankle plantarflexion    Ankle inversion    Ankle eversion     (Blank rows = not tested)  LOWER EXTREMITY SPECIAL TESTS:  NA  FUNCTIONAL TESTS:  5 times sit to stand: 19 seconds with bil UE assist 2 minute walk test: 226 ft. Merrilee Jansky Balance Scale: 35/56 Dynamic Gait Index: 10/24  GAIT: Distance walked: 225' Assistive device utilized: Single point cane Level of assistance: Modified independence Comments: visually slow gait speed, genu valgum (L>R), L ankle pronation, decreased w/s to LLE, decreased heel strike bil.     TODAY'S TREATMENT: 06/01/2022 Therapeutic Exercise: to improve strength and mobility.  Demo, verbal and tactile cues throughout for technique. Nustep L5 x 6 min (LE only) TUG 15 seconds, 14.4 seconds, 13.5 seconds Average 14.3 seconds  At counter: Hip hikes 2 x 10 bil  Heel/toe raises x 20 On leg press 20# x 10 Step ups (no riser) x 10 bil Side step ups (no riser) x 10 bil  Neuromuscular Reeducation: to improve balance and stability. SBA for safety throughout.  SLS x 13 sec without UE support on L, unable to maintain more than 3 seconds on R, standing on R with bil UE support x 30 sec Gait x 225' - cues for posture and heel strike.   05/28/2022 Therapeutic Exercise: to improve strength and mobility.   Bike L2 x 6 min  Retro stepping 2 x 10 bil - 1 UE support Side stepping 2 x 10 bil - back to counter for safety Neuromuscular Reeducation: to improve balance and stability. SBA for safety throughout.  Eyes open feet together x 30 sec  Eyes open feet together with head nods x 10 Eyes  open feet together with head turns x 10 Eyes closed feet together x 30 sec Eyes closed feet together with head nods x 10 Eyes closed feet together with head turns x 10  On Airex: Eyes open feet apart x 30 sec  Eyes open feet apart with head nods x 10 Eyes open feet apart with head turns x 10 Eyes closed feet apart x 30 sec At counter: Single leg balance with toe touch - UE support needed.     05/25/22 TherEx: Standing march at counter x10  Standing heel/toe raise x 10 at counter Standing one leg with back leg toe support L LE x 20 sec 2x; R LE 2x 10"   - Fwd/post WS in this position x 10  - lateral WS and rotation in same position x 10 Forward Ts x 10 bil Supine LTR     PATIENT EDUCATION:  Education details: review and progression HEP 05/21/22, 05/28/22, 10/2 - added hip hike Person educated: Patient Education method: Explanation, Demonstration, Verbal cues, and Handouts Education comprehension: verbalized understanding and returned demonstration   HOME EXERCISE PROGRAM: Access Code: [email or web address]  ASSESSMENT:  CLINICAL IMPRESSION: MIKEYLA MUSIC reports still having difficulty with SLS with toe touch, so reviewed and worked on just SLS - able to fully shift weight to LLE and maintain balance for 13 seconds, but very challenged on R, worked on hip hike exercise to strengthen glut med and QL as still has trendelenberg gait.   Average TUG was 14 seconds without AD demonstrating decreased fall risk and meeting STG #2. [place] Payment continues to demonstrate potential for improvement and would benefit from continued skilled therapy to address impairments.     OBJECTIVE IMPAIRMENTS Abnormal gait, decreased balance, decreased endurance, decreased mobility, difficulty walking, decreased strength, decreased safety awareness, improper body mechanics, postural dysfunction, and pain.   ACTIVITY LIMITATIONS carrying, lifting, bending, standing, squatting, stairs, transfers, and locomotion  level  PARTICIPATION LIMITATIONS: cleaning, shopping, and community activity  PERSONAL FACTORS Age, Fitness, and 3+ comorbidities: L knee OA, R THA, HTN, essential tremor, neuropathy, parathyroid disfunction, obesity  are also affecting patient's functional outcome.   REHAB POTENTIAL: Good  CLINICAL DECISION MAKING: Evolving/moderate complexity  EVALUATION COMPLEXITY: Moderate   GOALS: Goals reviewed with patient? Yes  SHORT TERM GOALS: Target date: 05/28/2022   Patient will be independent with initial HEP. Baseline:  Goal status: MET 05/21/2022- some clarification needed.  05/28/22- good compliance  2.  Patient will demonstrate decreased fall risk by scoring < 25 sec on TUG. Baseline: NT Goal status: MET  06/01/2022: 14.3 seconds average  3.  Patient will be educated on strategies to decrease risk of falls.  Baseline: needs review Goal status: MET  05/28/22- educated on fall risks and safety   LONG TERM GOALS: Target date: 07/09/2022   Patient will be independent with advanced/ongoing HEP to improve outcomes and carryover.  Baseline:  Goal status: IN PROGRESS  2.  Patient will be able to ambulate 600' with LRAD with good safety to access community.  Baseline: fatigues after 226 ft, SPC needed Goal status: IN PROGRESS  3.  Patient will be able to step up/down curb safely with LRAD for safety with community ambulation.  Baseline: difficulty Goal status: IN PROGRESS   4.  Patient will demonstrate improved functional LE strength as demonstrated by 5x STS <15 seconds. Baseline: 19 seconds with UE assist Goal status: IN PROGRESS  5.  Patient will demonstrate at least 19/24 on DGI to improve gait stability and reduce risk for falls. Baseline: 10/24 Goal status: IN PROGRESS  6.  Patient will score 45/56 on Berg Balance test to demonstrate lower risk of falls. (MCID= 8 points) .  Baseline: 35/56 Goal status: IN PROGRESS  7.  Patient will report 10% improvement  on ABC  scale to demonstrate improved functional ability. Baseline: 800/1600 = 50% confidence Goal status: IN PROGRESS    PLAN: PT FREQUENCY: 1-2x/week  PT DURATION: 8 weeks  PLANNED INTERVENTIONS: Therapeutic exercises, Therapeutic activity, Neuromuscular re-education, Balance training, Gait training, Patient/Family education, Self Care, Joint mobilization, Stair training, Orthotic/Fit training, Dry Needling, Cryotherapy, Moist heat, Ultrasound, Manual therapy, and Re-evaluation  PLAN FOR NEXT SESSION: continue LE strengthening, gait and balance.     Rennie Natter, PT, DPT  06/01/2022, 2:06 PM

## 2022-06-04 ENCOUNTER — Encounter: Payer: Medicare Other | Admitting: Orthopaedic Surgery

## 2022-06-05 ENCOUNTER — Encounter: Payer: Self-pay | Admitting: Physical Therapy

## 2022-06-05 ENCOUNTER — Ambulatory Visit: Payer: Medicare Other | Admitting: Physical Therapy

## 2022-06-05 DIAGNOSIS — R262 Difficulty in walking, not elsewhere classified: Secondary | ICD-10-CM

## 2022-06-05 DIAGNOSIS — G8929 Other chronic pain: Secondary | ICD-10-CM

## 2022-06-05 DIAGNOSIS — R2681 Unsteadiness on feet: Secondary | ICD-10-CM

## 2022-06-05 DIAGNOSIS — M6281 Muscle weakness (generalized): Secondary | ICD-10-CM

## 2022-06-05 NOTE — Therapy (Signed)
OUTPATIENT PHYSICAL THERAPY TREATMENT NOTE   Patient Name: Alison Chandler MRN: [id number] DOB:1941/03/03, 81 y.o., female Today's Date: 06/05/2022   PT End of Session - 06/05/22 1103     Visit Number 7    Date for PT Re-Evaluation 07/09/22    Authorization Type Medicare + [employer]    Progress Note Due on Visit 10    PT Start Time 1101    PT Stop Time 1144    PT Time Calculation (min) 43 min    Activity Tolerance Patient tolerated treatment well    Behavior During Therapy WFL for tasks assessed/performed              Past Medical History:  Diagnosis Date   Arthritis    Hearing loss    History of kidney stones    Hyperlipidemia    Hypertension    Neuromuscular disorder ([place])    neuropathy legs no medicine   Parathyroid dysfunction ([place])    one Quit working was removed   Tremor, essential    Past Surgical History:  Procedure Laterality Date   colonscopy  01/27/2012   JOINT REPLACEMENT Right 2013   THR   KIDNEY STONE SURGERY     ORIF TIBIA PLATEAU Right 07/15/2021   Procedure: OPEN REDUCTION INTERNAL FIXATION (ORIF) RIGHT TIBIAL PLATEAU;  Surgeon: Mcarthur Rossetti, MD;  Location: [facility];  Service: Orthopedics;  Laterality: Right;   PARATHYROIDECTOMY N/A 05/10/2019   Procedure: PARATHYROIDECTOMY;  Surgeon: Fredirick Maudlin, MD;  Location: [facility];  Service: General;  Laterality: N/A;   TOTAL HIP ARTHROPLASTY  02/19/2012   Procedure: TOTAL HIP ARTHROPLASTY ANTERIOR APPROACH;  Surgeon: Mcarthur Rossetti, MD;  Location: [facility];  Service: Orthopedics;  Laterality: Right;   Patient Active Problem List   Diagnosis Date Noted   Fracture of right tibial plateau 07/15/2021   Closed fracture of right tibial plateau, initial encounter 07/15/2021   Unilateral primary osteoarthritis, left knee 12/19/2019   Benign essential hypertension 10/30/2019   Hypercalcemia 10/30/2019   Acute medial meniscal tear, right, initial encounter 08/16/2019   Iatrogenic hypocalcemia  06/05/2019   Primary hyperparathyroidism ([place])    Nuclear sclerotic cataract of both eyes 10/05/2018   RPE mottling of macula 10/05/2018   Flat foot 06/20/2018   Tibialis posterior tendonitis, left 06/20/2018   Acute pain of right shoulder 05/04/2018   Chronic pain of left knee 12/22/2017   Subjective memory complaints 10/08/2017   It band syndrome, left 08/11/2017   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/2017   Meningioma ([place]) 03/23/2017   Idiopathic peripheral neuropathy 03/11/2017   Imbalance 03/11/2017   Paresthesias 12/06/2016   Tremor 12/06/2016   Age-related osteoporosis without current pathological fracture 12/03/2016   Nephrolithiasis 12/03/2016   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/21/2016   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   Hypertropia of left eye 09/05/2014   Intermittent exotropia of left eye 09/05/2014   Midline cystocele 05/10/2014   Stress bladder incontinence, female 05/10/2014   Benign neoplasm of colon 04/16/2014   Calculus of ureter 04/16/2014   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   Esophageal reflux 04/16/2014   Gross hematuria 04/16/2014   Headache 04/16/2014   Hypothyroidism 04/16/2014   Increased urinary frequency 04/16/2014   Keratoconjunctivitis sicca, 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 04/16/2014   Osteopenia, 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   Other seborrheic keratosis 04/16/2014   Other synovitis and tenosynovitis 04/16/2014   Presbycusis 04/16/2014   Primary localized osteoarthrosis, pelvic region and thigh 04/16/2014   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   Strabismus  04/16/2014   Urethral stenosis 04/16/2014   Urinary tract infection 04/16/2014   Allergic rhinitis 04/16/2014   Degenerative arthritis of hip 02/19/2012    PCP: Verdell Carmine.,  MD  REFERRING PROVIDER: Mcarthur Rossetti, MD  REFERRING DIAG: [phone] (ICD-10-CM) - Chronic pain of left knee  THERAPY DIAG:  Unsteadiness on feet  Difficulty in walking, not elsewhere  classified  Chronic pain of left knee  Muscle weakness (generalized)  Rationale for Evaluation and Treatment Rehabilitation  ONSET DATE:  fell last September fracturing R knee   SUBJECTIVE:   SUBJECTIVE STATEMENT: Alison Chandler reports that she hasn't had any pain but has felt very off balance this week, "like my legs don't want to do what I'm telling them" so has been using her cane more.        PERTINENT HISTORY: R tibial fracture, R TKA, L knee OA/chronic pain, neuropathy, essential tremor, HTN, parathyroid dysfunction  PAIN:  Are you having pain? No  PRECAUTIONS: Fall  WEIGHT BEARING RESTRICTIONS No  FALLS:  Has patient fallen in last 6 months? No but feels unsteady  LIVING ENVIRONMENT: Lives with: lives with their spouse Lives in: House/apartment Stairs: Yes: Internal: 12 steps; on right going up and on left going up and External: 1 steps; none Has following equipment at home: Single point cane  OCCUPATION: retired  PLOF: Independent  PATIENT GOALS improve balance, be able to go shopping again and feel confident with balance   OBJECTIVE:   DIAGNOSTIC FINDINGS: L knee 11/06/2020 2 views of the left knee show tricompartment arthritic changes involving  mainly the lateral compartment and the patellofemoral joint.  There are  large osteophytes in all 3 compartments and significant joint space  narrowing.  PATIENT SURVEYS:  ABC scale 800/1600 = 50% = moderate level of physical functioning.   COGNITION:  Overall cognitive status: Within functional limits for tasks assessed     SENSATION: Numbness/tingling in toes both feet, diminished sensation 25% over L lateral aspect lower leg  EDEMA:  NA  MUSCLE LENGTH: NT  POSTURE: rounded shoulders, forward head, decreased lumbar lordosis, increased thoracic kyphosis, and downward looking gaze  PALPATION: NA  LOWER EXTREMITY ROM:  Active ROM Right eval Left eval  Knee flexion    Knee extension 0 0  Ankle  dorsiflexion    Ankle plantarflexion     (Blank rows = not tested)  LOWER EXTREMITY MMT:  MMT Right eval Left eval  Hip flexion 4+ 4  Hip extension 4 4  Hip abduction 5 5  Hip adduction 5 5  Hip internal rotation    Hip external rotation    Knee flexion 4+ 4+  Knee extension 5 5  Ankle dorsiflexion 5 5  Ankle plantarflexion    Ankle inversion    Ankle eversion     (Blank rows = not tested)  LOWER EXTREMITY SPECIAL TESTS:  NA  FUNCTIONAL TESTS:  5 times sit to stand: 19 seconds with bil UE assist 2 minute walk test: 226 ft. Merrilee Jansky Balance Scale: 35/56 Dynamic Gait Index: 10/24  GAIT: Distance walked: 225' Assistive device utilized: Single point cane Level of assistance: Modified independence Comments: visually slow gait speed, genu valgum (L>R), L ankle pronation, decreased w/s to LLE, decreased heel strike bil.     TODAY'S TREATMENT: 06/05/2022 Therapeutic Exercise: to improve strength and mobility.  Demo, verbal and tactile cues throughout for technique. Nustep L5 x 6 min (LE only) At counter with 2UE support: Heel toe raises x 15  Hip extension x 10 bil  Hip abduction x 10 bil  Seated hip flexor up and overs - 2 x 10 bil Seated hip flexor stretch - difficulty with position Supine hip flexor stretch - uncomfortable Supine hip IR/ER - feels mostly in posterior hip Bridges 2 x 10  Prone hip extension 2 x 10  Prone bent knee fall-outs 2 x 10 bil - cues to keep legs bent.   06/01/2022 Therapeutic Exercise: to improve strength and mobility.  Demo, verbal and tactile cues throughout for technique. Nustep L5 x 6 min (LE only) TUG 15 seconds, 14.4 seconds, 13.5 seconds Average 14.3 seconds  At counter: Hip hikes 2 x 10 bil  Heel/toe raises x 20 On leg press 20# x 10 Step ups (no riser) x 10 bil Side step ups (no riser) x 10 bil  Neuromuscular Reeducation: to improve balance and stability. SBA for safety throughout.  SLS x 13 sec without UE support on L, unable to  maintain more than 3 seconds on R, standing on R with bil UE support x 30 sec Gait x 225' - cues for posture and heel strike.   05/28/2022 Therapeutic Exercise: to improve strength and mobility.   Bike L2 x 6 min  Retro stepping 2 x 10 bil - 1 UE support Side stepping 2 x 10 bil - back to counter for safety Neuromuscular Reeducation: to improve balance and stability. SBA for safety throughout.  Eyes open feet together x 30 sec  Eyes open feet together with head nods x 10 Eyes open feet together with head turns x 10 Eyes closed feet together x 30 sec Eyes closed feet together with head nods x 10 Eyes closed feet together with head turns x 10  On Airex: Eyes open feet apart x 30 sec  Eyes open feet apart with head nods x 10 Eyes open feet apart with head turns x 10 Eyes closed feet apart x 30 sec At counter: Single leg balance with toe touch - UE support needed.     PATIENT EDUCATION:  Education details: review and progression HEP 05/21/22, 05/28/22, 10/2, 10/6 Person educated: Patient Education method: Consulting civil engineer, Demonstration, Verbal cues, and Handouts Education comprehension: verbalized understanding and returned demonstration   HOME EXERCISE PROGRAM: Access Code: [email or web address]  ASSESSMENT:  CLINICAL IMPRESSION: CLODAGH ODENTHAL reported tightness in hip flexors today, worked on finding stretch that she could tolerate without increased pressure on knees, but still felt mostly in posterior hip.  She did like the seated hip flexor strengthening exercises and this will help with hip IR/ER as well.  Given supine and prone hip extensor strengthening exercises as well.  Alison Chandler continues to demonstrate potential for improvement and would benefit from continued skilled therapy to address impairments.     OBJECTIVE IMPAIRMENTS Abnormal gait, decreased balance, decreased endurance, decreased mobility, difficulty walking, decreased strength, decreased safety awareness, improper body  mechanics, postural dysfunction, and pain.   ACTIVITY LIMITATIONS carrying, lifting, bending, standing, squatting, stairs, transfers, and locomotion level  PARTICIPATION LIMITATIONS: cleaning, shopping, and community activity  PERSONAL FACTORS Age, Fitness, and 3+ comorbidities: L knee OA, R THA, HTN, essential tremor, neuropathy, parathyroid disfunction, obesity  are also affecting patient's functional outcome.   REHAB POTENTIAL: Good  CLINICAL DECISION MAKING: Evolving/moderate complexity  EVALUATION COMPLEXITY: Moderate   GOALS: Goals reviewed with patient? Yes  SHORT TERM GOALS: Target date: 05/28/2022   Patient will be independent with initial HEP. Baseline:  Goal status: MET 05/21/2022- some clarification needed.  05/28/22- good compliance  2.  Patient will demonstrate decreased fall risk by scoring < 25 sec on TUG. Baseline: NT Goal status: MET  06/01/2022: 14.3 seconds average   3.  Patient will be educated on strategies to decrease risk of falls.  Baseline: needs review Goal status: MET  05/28/22- educated on fall risks and safety   LONG TERM GOALS: Target date: 07/09/2022   Patient will be independent with advanced/ongoing HEP to improve outcomes and carryover.  Baseline:  Goal status: IN PROGRESS  2.  Patient will be able to ambulate 600' with LRAD with good safety to access community.  Baseline: fatigues after 226 ft, SPC needed Goal status: IN PROGRESS  3.  Patient will be able to step up/down curb safely with LRAD for safety with community ambulation.  Baseline: difficulty Goal status: IN PROGRESS   4.  Patient will demonstrate improved functional LE strength as demonstrated by 5x STS <15 seconds. Baseline: 19 seconds with UE assist Goal status: IN PROGRESS  5.  Patient will demonstrate at least 19/24 on DGI to improve gait stability and reduce risk for falls. Baseline: 10/24 Goal status: IN PROGRESS  6.  Patient will score 45/56 on Berg Balance test to  demonstrate lower risk of falls. (MCID= 8 points) .  Baseline: 35/56 Goal status: IN PROGRESS  7.  Patient will report 10% improvement  on ABC scale to demonstrate improved functional ability. Baseline: 800/1600 = 50% confidence Goal status: IN PROGRESS    PLAN: PT FREQUENCY: 1-2x/week  PT DURATION: 8 weeks  PLANNED INTERVENTIONS: Therapeutic exercises, Therapeutic activity, Neuromuscular re-education, Balance training, Gait training, Patient/Family education, Self Care, Joint mobilization, Stair training, Orthotic/Fit training, Dry Needling, Cryotherapy, Moist heat, Ultrasound, Manual therapy, and Re-evaluation  PLAN FOR NEXT SESSION: continue LE strengthening, gait and balance.     Rennie Natter, PT, DPT  06/05/2022, 11:51 AM

## 2022-06-08 ENCOUNTER — Ambulatory Visit: Payer: Medicare Other

## 2022-06-08 DIAGNOSIS — M6281 Muscle weakness (generalized): Secondary | ICD-10-CM

## 2022-06-08 DIAGNOSIS — R2681 Unsteadiness on feet: Secondary | ICD-10-CM | POA: Diagnosis not present

## 2022-06-08 DIAGNOSIS — R262 Difficulty in walking, not elsewhere classified: Secondary | ICD-10-CM

## 2022-06-08 DIAGNOSIS — G8929 Other chronic pain: Secondary | ICD-10-CM

## 2022-06-08 NOTE — Therapy (Signed)
OUTPATIENT PHYSICAL THERAPY TREATMENT NOTE   Patient Name: Alison Chandler MRN: [id number] DOB:[birth year], 81 y.o., female Today's Date: 06/08/2022   PT End of Session - 06/08/22 1447     Visit Number 8    Date for PT Re-Evaluation 07/09/22    Authorization Type Medicare + [employer]    Progress Note Due on Visit 10    PT Start Time 1402    PT Stop Time 1445    PT Time Calculation (min) 43 min    Activity Tolerance Patient tolerated treatment well    Behavior During Therapy WFL for tasks assessed/performed               Past Medical History:  Diagnosis Date   Arthritis    Hearing loss    History of kidney stones    Hyperlipidemia    Hypertension    Neuromuscular disorder ([place])    neuropathy legs no medicine   Parathyroid dysfunction ([place])    one Quit working was removed   Tremor, essential    Past Surgical History:  Procedure Laterality Date   colonscopy  01/27/2012   JOINT REPLACEMENT Right 2013   THR   KIDNEY STONE SURGERY     ORIF TIBIA PLATEAU Right 07/15/2021   Procedure: OPEN REDUCTION INTERNAL FIXATION (ORIF) RIGHT TIBIAL PLATEAU;  Surgeon: Mcarthur Rossetti, MD;  Location: [place];  Service: Orthopedics;  Laterality: Right;   PARATHYROIDECTOMY N/A 05/10/2019   Procedure: PARATHYROIDECTOMY;  Surgeon: Fredirick Maudlin, MD;  Location: [facility];  Service: General;  Laterality: N/A;   TOTAL HIP ARTHROPLASTY  02/19/2012   Procedure: TOTAL HIP ARTHROPLASTY ANTERIOR APPROACH;  Surgeon: Mcarthur Rossetti, MD;  Location: [facility];  Service: Orthopedics;  Laterality: Right;   Patient Active Problem List   Diagnosis Date Noted   Fracture of right tibial plateau 07/15/2021   Closed fracture of right tibial plateau, initial encounter 07/15/2021   Unilateral primary osteoarthritis, left knee 12/19/2019   Benign essential hypertension 10/30/2019   Hypercalcemia 10/30/2019   Acute medial meniscal tear, right, initial encounter 08/16/2019   Iatrogenic hypocalcemia  06/05/2019   Primary hyperparathyroidism ([place])    Nuclear sclerotic cataract of both eyes 10/05/2018   RPE mottling of macula 10/05/2018   Flat foot 06/20/2018   Tibialis posterior tendonitis, left 06/20/2018   Acute pain of right shoulder 05/04/2018   Chronic pain of left knee 12/22/2017   Subjective memory complaints 10/08/2017   It band syndrome, left 08/11/2017   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[place]) 03/23/2017   Meningioma ([place]) 03/23/2017   Idiopathic peripheral neuropathy 03/11/2017   Imbalance 03/11/2017   Paresthesias 12/06/2016   Tremor 12/06/2016   Age-related osteoporosis without current pathological fracture 12/03/2016   Nephrolithiasis 12/03/2016   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/21/2016   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   Hypertropia of left eye 09/05/2014   Intermittent exotropia of left eye 09/05/2014   Midline cystocele 05/10/2014   Stress bladder incontinence, female 05/10/2014   Benign neoplasm of colon 04/16/2014   Calculus of ureter 04/16/2014   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   Esophageal reflux 04/16/2014   Gross hematuria 04/16/2014   Headache 04/16/2014   Hypothyroidism 04/16/2014   Increased urinary frequency 04/16/2014   Keratoconjunctivitis sicca, 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 04/16/2014   Osteopenia, 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   Other seborrheic keratosis 04/16/2014   Other synovitis and tenosynovitis 04/16/2014   Presbycusis 04/16/2014   Primary localized osteoarthrosis, pelvic region and thigh 04/16/2014   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  Strabismus 04/16/2014   Urethral stenosis 04/16/2014   Urinary tract infection 04/16/2014   Allergic rhinitis 04/16/2014   Degenerative arthritis of hip 02/19/2012    PCP: Verdell Carmine.,  MD  REFERRING PROVIDER: Mcarthur Rossetti, MD  REFERRING DIAG: [phone] (ICD-10-CM) - Chronic pain of left knee  THERAPY DIAG:  Unsteadiness on feet  Difficulty in walking, not elsewhere  classified  Chronic pain of left knee  Muscle weakness (generalized)  Rationale for Evaluation and Treatment Rehabilitation  ONSET DATE:  fell last September fracturing R knee   SUBJECTIVE:   SUBJECTIVE STATEMENT:      Pt reports not using cane at home. Feels like her balance has improved and no recent falls.   PERTINENT HISTORY: R tibial fracture, R TKA, L knee OA/chronic pain, neuropathy, essential tremor, HTN, parathyroid dysfunction  PAIN:  Are you having pain? No  PRECAUTIONS: Fall  WEIGHT BEARING RESTRICTIONS No  FALLS:  Has patient fallen in last 6 months? No but feels unsteady  LIVING ENVIRONMENT: Lives with: lives with their spouse Lives in: House/apartment Stairs: Yes: Internal: 12 steps; on right going up and on left going up and External: 1 steps; none Has following equipment at home: Single point cane  OCCUPATION: retired  PLOF: Independent  PATIENT GOALS improve balance, be able to go shopping again and feel confident with balance   OBJECTIVE:   DIAGNOSTIC FINDINGS: L knee 11/06/2020 2 views of the left knee show tricompartment arthritic changes involving  mainly the lateral compartment and the patellofemoral joint.  There are  large osteophytes in all 3 compartments and significant joint space  narrowing.  PATIENT SURVEYS:  ABC scale 800/1600 = 50% = moderate level of physical functioning.   COGNITION:  Overall cognitive status: Within functional limits for tasks assessed     SENSATION: Numbness/tingling in toes both feet, diminished sensation 25% over L lateral aspect lower leg  EDEMA:  NA  MUSCLE LENGTH: NT  POSTURE: rounded shoulders, forward head, decreased lumbar lordosis, increased thoracic kyphosis, and downward looking gaze  PALPATION: NA  LOWER EXTREMITY ROM:  Active ROM Right eval Left eval  Knee flexion    Knee extension 0 0  Ankle dorsiflexion    Ankle plantarflexion     (Blank rows = not tested)  LOWER EXTREMITY  MMT:  MMT Right eval Left eval  Hip flexion 4+ 4  Hip extension 4 4  Hip abduction 5 5  Hip adduction 5 5  Hip internal rotation    Hip external rotation    Knee flexion 4+ 4+  Knee extension 5 5  Ankle dorsiflexion 5 5  Ankle plantarflexion    Ankle inversion    Ankle eversion     (Blank rows = not tested)  LOWER EXTREMITY SPECIAL TESTS:  NA  FUNCTIONAL TESTS:  5 times sit to stand: 19 seconds with bil UE assist 2 minute walk test: 226 ft. Merrilee Jansky Balance Scale: 35/56 Dynamic Gait Index: 10/24  GAIT: Distance walked: 225' Assistive device utilized: Single point cane Level of assistance: Modified independence Comments: visually slow gait speed, genu valgum (L>R), L ankle pronation, decreased w/s to LLE, decreased heel strike bil.     TODAY'S TREATMENT: 06/08/22 Therapeutic Exercise: Nustep L5x14mn LE only Standing trunk rotations x 10  Standing arm swings x 10 bil Standing bird dog at counter x 10 bil Hip extension at counter x 10 bil Seated hip flexor stretch x 30" bil Supine windshield wipers 10x Seated hip flexor up and overs x  10 no weight; x 10 2# weight   06/05/2022 Therapeutic Exercise: to improve strength and mobility.  Demo, verbal and tactile cues throughout for technique. Nustep L5 x 6 min (LE only) At counter with 2UE support: Heel toe raises x 15 Hip extension x 10 bil  Hip abduction x 10 bil  Seated hip flexor up and overs - 2 x 10 bil Seated hip flexor stretch - difficulty with position Supine hip flexor stretch - uncomfortable Supine hip IR/ER - feels mostly in posterior hip Bridges 2 x 10  Prone hip extension 2 x 10  Prone bent knee fall-outs 2 x 10 bil - cues to keep legs bent.   06/01/2022 Therapeutic Exercise: to improve strength and mobility.  Demo, verbal and tactile cues throughout for technique. Nustep L5 x 6 min (LE only) TUG 15 seconds, 14.4 seconds, 13.5 seconds Average 14.3 seconds  At counter: Hip hikes 2 x 10 bil  Heel/toe  raises x 20 On leg press 20# x 10 Step ups (no riser) x 10 bil Side step ups (no riser) x 10 bil  Neuromuscular Reeducation: to improve balance and stability. SBA for safety throughout.  SLS x 13 sec without UE support on L, unable to maintain more than 3 seconds on R, standing on R with bil UE support x 30 sec Gait x 225' - cues for posture and heel strike.   05/28/2022 Therapeutic Exercise: to improve strength and mobility.   Bike L2 x 6 min  Retro stepping 2 x 10 bil - 1 UE support Side stepping 2 x 10 bil - back to counter for safety Neuromuscular Reeducation: to improve balance and stability. SBA for safety throughout.  Eyes open feet together x 30 sec  Eyes open feet together with head nods x 10 Eyes open feet together with head turns x 10 Eyes closed feet together x 30 sec Eyes closed feet together with head nods x 10 Eyes closed feet together with head turns x 10  On Airex: Eyes open feet apart x 30 sec  Eyes open feet apart with head nods x 10 Eyes open feet apart with head turns x 10 Eyes closed feet apart x 30 sec At counter: Single leg balance with toe touch - UE support needed.     PATIENT EDUCATION:  Education details: review and progression HEP 05/21/22, 05/28/22, 10/2, 10/6 Person educated: Patient Education method: Explanation, Demonstration, Verbal cues, and Handouts Education comprehension: verbalized understanding and returned demonstration   HOME EXERCISE PROGRAM: Access Code: [email or web address]  ASSESSMENT:  CLINICAL IMPRESSION: Pt responded well to the progression of exercises. Continued to progress core stability, trunk mobility, and proximal strengthening. Instruction and cuing required for upright posture during session. She was able to do seated lunge hip flexor stretch w/ no issues today. Would benefit from continued progression of TE.    OBJECTIVE IMPAIRMENTS Abnormal gait, decreased balance, decreased endurance, decreased mobility, difficulty walking,  decreased strength, decreased safety awareness, improper body mechanics, postural dysfunction, and pain.   ACTIVITY LIMITATIONS carrying, lifting, bending, standing, squatting, stairs, transfers, and locomotion level  PARTICIPATION LIMITATIONS: cleaning, shopping, and community activity  PERSONAL FACTORS Age, Fitness, and 3+ comorbidities: L knee OA, R THA, HTN, essential tremor, neuropathy, parathyroid disfunction, obesity  are also affecting patient's functional outcome.   REHAB POTENTIAL: Good  CLINICAL DECISION MAKING: Evolving/moderate complexity  EVALUATION COMPLEXITY: Moderate   GOALS: Goals reviewed with patient? Yes  SHORT TERM GOALS: Target date: 05/28/2022   Patient will be independent with initial  HEP. Baseline:  Goal status: MET 05/21/2022- some clarification needed.  05/28/22- good compliance  2.  Patient will demonstrate decreased fall risk by scoring < 25 sec on TUG. Baseline: NT Goal status: MET  06/01/2022: 14.3 seconds average   3.  Patient will be educated on strategies to decrease risk of falls.  Baseline: needs review Goal status: MET  05/28/22- educated on fall risks and safety   LONG TERM GOALS: Target date: 07/09/2022   Patient will be independent with advanced/ongoing HEP to improve outcomes and carryover.  Baseline:  Goal status: IN PROGRESS  2.  Patient will be able to ambulate 600' with LRAD with good safety to access community.  Baseline: fatigues after 226 ft, SPC needed Goal status: IN PROGRESS  3.  Patient will be able to step up/down curb safely with LRAD for safety with community ambulation.  Baseline: difficulty Goal status: IN PROGRESS   4.  Patient will demonstrate improved functional LE strength as demonstrated by 5x STS <15 seconds. Baseline: 19 seconds with UE assist Goal status: IN PROGRESS  5.  Patient will demonstrate at least 19/24 on DGI to improve gait stability and reduce risk for falls. Baseline: 10/24 Goal status: IN  PROGRESS  6.  Patient will score 45/56 on Berg Balance test to demonstrate lower risk of falls. (MCID= 8 points) .  Baseline: 35/56 Goal status: IN PROGRESS  7.  Patient will report 10% improvement  on ABC scale to demonstrate improved functional ability. Baseline: 800/1600 = 50% confidence Goal status: IN PROGRESS    PLAN: PT FREQUENCY: 1-2x/week  PT DURATION: 8 weeks  PLANNED INTERVENTIONS: Therapeutic exercises, Therapeutic activity, Neuromuscular re-education, Balance training, Gait training, Patient/Family education, Self Care, Joint mobilization, Stair training, Orthotic/Fit training, Dry Needling, Cryotherapy, Moist heat, Ultrasound, Manual therapy, and Re-evaluation  PLAN FOR NEXT SESSION: continue LE strengthening, gait and balance.     Artist Pais, PTA 06/08/2022, 2:47 PM

## 2022-06-11 ENCOUNTER — Ambulatory Visit: Payer: Medicare Other

## 2022-06-11 DIAGNOSIS — R2681 Unsteadiness on feet: Secondary | ICD-10-CM | POA: Diagnosis not present

## 2022-06-11 DIAGNOSIS — R262 Difficulty in walking, not elsewhere classified: Secondary | ICD-10-CM

## 2022-06-11 DIAGNOSIS — G8929 Other chronic pain: Secondary | ICD-10-CM

## 2022-06-11 DIAGNOSIS — M6281 Muscle weakness (generalized): Secondary | ICD-10-CM

## 2022-06-11 NOTE — Therapy (Signed)
OUTPATIENT PHYSICAL THERAPY TREATMENT NOTE   Patient Name: Alison Chandler MRN: [id number] DOB:[birth year], 81 y.o., female Today's Date: 06/11/2022   PT End of Session - 06/11/22 1402     Visit Number 9    Date for PT Re-Evaluation 07/09/22    Authorization Type Medicare + [employer]    Progress Note Due on Visit 10    PT Start Time 1316    PT Stop Time 1400    PT Time Calculation (min) 44 min    Activity Tolerance Patient tolerated treatment well    Behavior During Therapy WFL for tasks assessed/performed                Past Medical History:  Diagnosis Date   Arthritis    Hearing loss    History of kidney stones    Hyperlipidemia    Hypertension    Neuromuscular disorder ([place])    neuropathy legs no medicine   Parathyroid dysfunction ([place])    one Quit working was removed   Tremor, essential    Past Surgical History:  Procedure Laterality Date   colonscopy  01/27/2012   JOINT REPLACEMENT Right 2013   THR   KIDNEY STONE SURGERY     ORIF TIBIA PLATEAU Right 07/15/2021   Procedure: OPEN REDUCTION INTERNAL FIXATION (ORIF) RIGHT TIBIAL PLATEAU;  Surgeon: Mcarthur Rossetti, MD;  Location: [place];  Service: Orthopedics;  Laterality: Right;   PARATHYROIDECTOMY N/A 05/10/2019   Procedure: PARATHYROIDECTOMY;  Surgeon: Fredirick Maudlin, MD;  Location: [facility];  Service: General;  Laterality: N/A;   TOTAL HIP ARTHROPLASTY  02/19/2012   Procedure: TOTAL HIP ARTHROPLASTY ANTERIOR APPROACH;  Surgeon: Mcarthur Rossetti, MD;  Location[facility];  Service: Orthopedics;  Laterality: Right;   Patient Active Problem List   Diagnosis Date Noted   Fracture of right tibial plateau 07/15/2021   Closed fracture of right tibial plateau, initial encounter 07/15/2021   Unilateral primary osteoarthritis, left knee 12/19/2019   Benign essential hypertension 10/30/2019   Hypercalcemia 10/30/2019   Acute medial meniscal tear, right, initial encounter 08/16/2019   Iatrogenic  hypocalcemia 06/05/2019   Primary hyperparathyroidism ([place])    Nuclear sclerotic cataract of both eyes 10/05/2018   RPE mottling of macula 10/05/2018   Flat foot 06/20/2018   Tibialis posterior tendonitis, left 06/20/2018   Acute pain of right shoulder 05/04/2018   Chronic pain of left knee 12/22/2017   Subjective memory complaints 10/08/2017   It band syndrome, left 08/11/2017   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[place]) 03/23/2017   Meningioma ([place]) 03/23/2017   Idiopathic peripheral neuropathy 03/11/2017   Imbalance 03/11/2017   Paresthesias 12/06/2016   Tremor 12/06/2016   Age-related osteoporosis without current pathological fracture 12/03/2016   Nephrolithiasis 12/03/2016   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/21/2016   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   Hypertropia of left eye 09/05/2014   Intermittent exotropia of left eye 09/05/2014   Midline cystocele 05/10/2014   Stress bladder incontinence, female 05/10/2014   Benign neoplasm of colon 04/16/2014   Calculus of ureter 04/16/2014   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   Esophageal reflux 04/16/2014   Gross hematuria 04/16/2014   Headache 04/16/2014   Hypothyroidism 04/16/2014   Increased urinary frequency 04/16/2014   Keratoconjunctivitis sicca, 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 04/16/2014   Osteopenia, 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   Other seborrheic keratosis 04/16/2014   Other synovitis and tenosynovitis 04/16/2014   Presbycusis 04/16/2014   Primary localized osteoarthrosis, pelvic region and thigh 04/16/2014   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  Strabismus 04/16/2014   Urethral stenosis 04/16/2014   Urinary tract infection 04/16/2014   Allergic rhinitis 04/16/2014   Degenerative arthritis of hip 02/19/2012    PCP: Verdell Carmine.,  MD  REFERRING PROVIDER: Mcarthur Rossetti, MD  REFERRING DIAG: [phone] (ICD-10-CM) - Chronic pain of left knee  THERAPY DIAG:  Unsteadiness on feet  Difficulty in walking, not elsewhere  classified  Chronic pain of left knee  Muscle weakness (generalized)  Rationale for Evaluation and Treatment Rehabilitation  ONSET DATE:  fell last September fracturing R knee   SUBJECTIVE:   SUBJECTIVE STATEMENT:     Pt reports today she is feeling more off-balance today, just thinks that it is sinus pressure which she is getting nasal spray from doctor.   PERTINENT HISTORY: R tibial fracture, R TKA, L knee OA/chronic pain, neuropathy, essential tremor, HTN, parathyroid dysfunction  PAIN:  Are you having pain? No  PRECAUTIONS: Fall  WEIGHT BEARING RESTRICTIONS No  FALLS:  Has patient fallen in last 6 months? No but feels unsteady  LIVING ENVIRONMENT: Lives with: lives with their spouse Lives in: House/apartment Stairs: Yes: Internal: 12 steps; on right going up and on left going up and External: 1 steps; none Has following equipment at home: Single point cane  OCCUPATION: retired  PLOF: Independent  PATIENT GOALS improve balance, be able to go shopping again and feel confident with balance   OBJECTIVE:   DIAGNOSTIC FINDINGS: L knee 11/06/2020 2 views of the left knee show tricompartment arthritic changes involving  mainly the lateral compartment and the patellofemoral joint.  There are  large osteophytes in all 3 compartments and significant joint space  narrowing.  PATIENT SURVEYS:  ABC scale 800/1600 = 50% = moderate level of physical functioning.   COGNITION:  Overall cognitive status: Within functional limits for tasks assessed     SENSATION: Numbness/tingling in toes both feet, diminished sensation 25% over L lateral aspect lower leg  EDEMA:  NA  MUSCLE LENGTH: NT  POSTURE: rounded shoulders, forward head, decreased lumbar lordosis, increased thoracic kyphosis, and downward looking gaze  PALPATION: NA  LOWER EXTREMITY ROM:  Active ROM Right eval Left eval  Knee flexion    Knee extension 0 0  Ankle dorsiflexion    Ankle plantarflexion      (Blank rows = not tested)  LOWER EXTREMITY MMT:  MMT Right eval Left eval  Hip flexion 4+ 4  Hip extension 4 4  Hip abduction 5 5  Hip adduction 5 5  Hip internal rotation    Hip external rotation    Knee flexion 4+ 4+  Knee extension 5 5  Ankle dorsiflexion 5 5  Ankle plantarflexion    Ankle inversion    Ankle eversion     (Blank rows = not tested)  LOWER EXTREMITY SPECIAL TESTS:  NA  FUNCTIONAL TESTS:  5 times sit to stand: 19 seconds with bil UE assist; 06/11/22 - 16 seconds bil UE support 2 minute walk test: 226 ft. Merrilee Jansky Balance Scale: 35/56 Dynamic Gait Index: 10/24  GAIT: Distance walked: 225' Assistive device utilized: Single point cane Level of assistance: Modified independence Comments: visually slow gait speed, genu valgum (L>R), L ankle pronation, decreased w/s to LLE, decreased heel strike bil.     TODAY'S TREATMENT: 06/11/22 Therapeutic Exercise: Nustep L5x19mn Standing trunk rotations x 10 bil Standing arm swings x 10 bil Star excursion R/L 3x 1/2 circle pattern Fwd stepping with WS and arm reach x 10 bil Standing D2 flexion bil x  10  Seated clams green TB x 12 bil Seated march green TB x 10 bil  06/08/22 Therapeutic Exercise: Nustep L5x16mn LE only Standing trunk rotations x 10  Standing arm swings x 10 bil Standing bird dog at counter x 10 bil Hip extension at counter x 10 bil Seated hip flexor stretch x 30" bil Supine windshield wipers 10x Seated hip flexor up and overs x 10 no weight; x 10 2# weight   06/05/2022 Therapeutic Exercise: to improve strength and mobility.  Demo, verbal and tactile cues throughout for technique. Nustep L5 x 6 min (LE only) At counter with 2UE support: Heel toe raises x 15 Hip extension x 10 bil  Hip abduction x 10 bil  Seated hip flexor up and overs - 2 x 10 bil Seated hip flexor stretch - difficulty with position Supine hip flexor stretch - uncomfortable Supine hip IR/ER - feels mostly in posterior  hip Bridges 2 x 10  Prone hip extension 2 x 10  Prone bent knee fall-outs 2 x 10 bil - cues to keep legs bent.        PATIENT EDUCATION:  Education details: review and progression HEP 05/21/22, 05/28/22, 10/2, 10/6 Person educated: Patient Education method: Explanation, Demonstration, Verbal cues, and Handouts Education comprehension: verbalized understanding and returned demonstration   HOME EXERCISE PROGRAM: Access Code: [email or web address] ASSESSMENT:  CLINICAL IMPRESSION: Pt responded well to the progression of exercises. Continued to progress proprioceptive exercises, trunk mobility, and proximal strengthening. Instruction and cuing required for upright posture during session. Pt had LOB a few times with the fwd stepping and arm reaching. Her score with 5x STS has improved from 19 sec to 16 sec with UE support. Pt is progressing well toward goals, will need 10th visit progress note next visit.   OBJECTIVE IMPAIRMENTS Abnormal gait, decreased balance, decreased endurance, decreased mobility, difficulty walking, decreased strength, decreased safety awareness, improper body mechanics, postural dysfunction, and pain.   ACTIVITY LIMITATIONS carrying, lifting, bending, standing, squatting, stairs, transfers, and locomotion level  PARTICIPATION LIMITATIONS: cleaning, shopping, and community activity  PERSONAL FACTORS Age, Fitness, and 3+ comorbidities: L knee OA, R THA, HTN, essential tremor, neuropathy, parathyroid disfunction, obesity  are also affecting patient's functional outcome.   REHAB POTENTIAL: Good  CLINICAL DECISION MAKING: Evolving/moderate complexity  EVALUATION COMPLEXITY: Moderate   GOALS: Goals reviewed with patient? Yes  SHORT TERM GOALS: Target date: 05/28/2022   Patient will be independent with initial HEP. Baseline:  Goal status: MET 05/21/2022- some clarification needed.  05/28/22- good compliance  2.  Patient will demonstrate decreased fall risk by scoring < 25  sec on TUG. Baseline: NT Goal status: MET  06/01/2022: 14.3 seconds average   3.  Patient will be educated on strategies to decrease risk of falls.  Baseline: needs review Goal status: MET  05/28/22- educated on fall risks and safety   LONG TERM GOALS: Target date: 07/09/2022   Patient will be independent with advanced/ongoing HEP to improve outcomes and carryover.  Baseline:  Goal status: IN PROGRESS  2.  Patient will be able to ambulate 600' with LRAD with good safety to access community.  Baseline: fatigues after 226 ft, SPC needed Goal status: IN PROGRESS  3.  Patient will be able to step up/down curb safely with LRAD for safety with community ambulation.  Baseline: difficulty Goal status: IN PROGRESS   4.  Patient will demonstrate improved functional LE strength as demonstrated by 5x STS <15 seconds. Baseline: 19 seconds with UE assist Goal  status: IN PROGRESS  5.  Patient will demonstrate at least 19/24 on DGI to improve gait stability and reduce risk for falls. Baseline: 10/24 Goal status: IN PROGRESS  6.  Patient will score 45/56 on Berg Balance test to demonstrate lower risk of falls. (MCID= 8 points) .  Baseline: 35/56 Goal status: IN PROGRESS  7.  Patient will report 10% improvement  on ABC scale to demonstrate improved functional ability. Baseline: 800/1600 = 50% confidence Goal status: IN PROGRESS    PLAN: PT FREQUENCY: 1-2x/week  PT DURATION: 8 weeks  PLANNED INTERVENTIONS: Therapeutic exercises, Therapeutic activity, Neuromuscular re-education, Balance training, Gait training, Patient/Family education, Self Care, Joint mobilization, Stair training, Orthotic/Fit training, Dry Needling, Cryotherapy, Moist heat, Ultrasound, Manual therapy, and Re-evaluation  PLAN FOR NEXT SESSION: progress note continue LE strengthening, gait and balance.     Artist Pais, PTA 06/11/2022, 2:03 PM

## 2022-06-15 ENCOUNTER — Ambulatory Visit: Payer: Medicare Other | Admitting: Physical Therapy

## 2022-06-15 ENCOUNTER — Encounter: Payer: Self-pay | Admitting: Physical Therapy

## 2022-06-15 DIAGNOSIS — R262 Difficulty in walking, not elsewhere classified: Secondary | ICD-10-CM

## 2022-06-15 DIAGNOSIS — R2681 Unsteadiness on feet: Secondary | ICD-10-CM

## 2022-06-15 DIAGNOSIS — G8929 Other chronic pain: Secondary | ICD-10-CM

## 2022-06-15 DIAGNOSIS — M6281 Muscle weakness (generalized): Secondary | ICD-10-CM

## 2022-06-15 NOTE — Therapy (Signed)
OUTPATIENT PHYSICAL THERAPY TREATMENT NOTE Progress Note Reporting Period 05/14/2022 to 06/15/2022  See note below for Objective Data and Assessment of Progress/Goals.      Patient Name: Alison Chandler MRN: [id number] DOB:11-14-1940, 81 y.o., female Today's Date: 06/15/2022   PT End of Session - 06/15/22 1403     Visit Number 10    Date for PT Re-Evaluation 07/09/22    Authorization Type Medicare + [employer]    Progress Note Due on Visit 10    PT Start Time 1401    PT Stop Time 1448    PT Time Calculation (min) 47 min    Activity Tolerance Patient tolerated treatment well    Behavior During Therapy WFL for tasks assessed/performed                Past Medical History:  Diagnosis Date   Arthritis    Hearing loss    History of kidney stones    Hyperlipidemia    Hypertension    Neuromuscular disorder ([place])    neuropathy legs no medicine   Parathyroid dysfunction ([place])    one Quit working was removed   Tremor, essential    Past Surgical History:  Procedure Laterality Date   colonscopy  01/27/2012   JOINT REPLACEMENT Right 2013   THR   KIDNEY STONE SURGERY     ORIF TIBIA PLATEAU Right 07/15/2021   Procedure: OPEN REDUCTION INTERNAL FIXATION (ORIF) RIGHT TIBIAL PLATEAU;  Surgeon: Mcarthur Rossetti, MD;  Location: [facility];  Service: Orthopedics;  Laterality: Right;   PARATHYROIDECTOMY N/A 05/10/2019   Procedure: PARATHYROIDECTOMY;  Surgeon: Fredirick Maudlin, MD;  Location: [facility];  Service: General;  Laterality: N/A;   TOTAL HIP ARTHROPLASTY  02/19/2012   Procedure: TOTAL HIP ARTHROPLASTY ANTERIOR APPROACH;  Surgeon: Mcarthur Rossetti, MD;  Location: [facility];  Service: Orthopedics;  Laterality: Right;   Patient Active Problem List   Diagnosis Date Noted   Fracture of right tibial plateau 07/15/2021   Closed fracture of right tibial plateau, initial encounter 07/15/2021   Unilateral primary osteoarthritis, left knee 12/19/2019   Benign essential  hypertension 10/30/2019   Hypercalcemia 10/30/2019   Acute medial meniscal tear, right, initial encounter 08/16/2019   Iatrogenic hypocalcemia 06/05/2019   Primary hyperparathyroidism ([place])    Nuclear sclerotic cataract of both eyes 10/05/2018   RPE mottling of macula 10/05/2018   Flat foot 06/20/2018   Tibialis posterior tendonitis, left 06/20/2018   Acute pain of right shoulder 05/04/2018   Chronic pain of left knee 12/22/2017   Subjective memory complaints 10/08/2017   It band syndrome, left 08/11/2017   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[place]) 03/23/2017   Meningioma ([place]) 03/23/2017   Idiopathic peripheral neuropathy 03/11/2017   Imbalance 03/11/2017   Paresthesias 12/06/2016   Tremor 12/06/2016   Age-related osteoporosis without current pathological fracture 12/03/2016   Nephrolithiasis 12/03/2016   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/21/2016   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   Hypertropia of left eye 09/05/2014   Intermittent exotropia of left eye 09/05/2014   Midline cystocele 05/10/2014   Stress bladder incontinence, female 05/10/2014   Benign neoplasm of colon 04/16/2014   Calculus of ureter 04/16/2014   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   Esophageal reflux 04/16/2014   Gross hematuria 04/16/2014   Headache 04/16/2014   Hypothyroidism 04/16/2014   Increased urinary frequency 04/16/2014   Keratoconjunctivitis sicca, 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 04/16/2014   Osteopenia, 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   Other seborrheic keratosis 04/16/2014   Other synovitis and tenosynovitis 04/16/2014  Presbycusis 04/16/2014   Primary localized osteoarthrosis, pelvic region and thigh 04/16/2014   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   Strabismus 04/16/2014   Urethral stenosis 04/16/2014   Urinary tract infection 04/16/2014   Allergic rhinitis 04/16/2014   Degenerative arthritis of hip 02/19/2012    PCP: Verdell Carmine.,  MD  REFERRING PROVIDER: Mcarthur Rossetti, MD  REFERRING DIAG:  [phone] (ICD-10-CM) - Chronic pain of left knee  THERAPY DIAG:  Unsteadiness on feet  Difficulty in walking, not elsewhere classified  Chronic pain of left knee  Muscle weakness (generalized)  Rationale for Evaluation and Treatment Rehabilitation  ONSET DATE:  fell last September fracturing R knee   SUBJECTIVE:   SUBJECTIVE STATEMENT:  Patient reports knee is sore today, but had grandchildren visiting this weekend so was much more active than normally.   PERTINENT HISTORY: R tibial fracture, R TKA, L knee OA/chronic pain, neuropathy, essential tremor, HTN, parathyroid dysfunction  PAIN:  Are you having pain? Yes: NPRS scale: 3-4/10 Pain location: L knee Pain description: sore  PRECAUTIONS: Fall  WEIGHT BEARING RESTRICTIONS No  FALLS:  Has patient fallen in last 6 months? No but feels unsteady  LIVING ENVIRONMENT: Lives with: lives with their spouse Lives in: House/apartment Stairs: Yes: Internal: 12 steps; on right going up and on left going up and External: 1 steps; none Has following equipment at home: Single point cane  OCCUPATION: retired  PLOF: Independent  PATIENT GOALS improve balance, be able to go shopping again and feel confident with balance   OBJECTIVE:   DIAGNOSTIC FINDINGS: L knee 11/06/2020 2 views of the left knee show tricompartment arthritic changes involving  mainly the lateral compartment and the patellofemoral joint.  There are  large osteophytes in all 3 compartments and significant joint space  narrowing.  PATIENT SURVEYS:  ABC scale 800/1600 = 50% = moderate level of physical functioning.   COGNITION:  Overall cognitive status: Within functional limits for tasks assessed     SENSATION: Numbness/tingling in toes both feet, diminished sensation 25% over L lateral aspect lower leg  EDEMA:  NA  MUSCLE LENGTH: NT  POSTURE: rounded shoulders, forward head, decreased lumbar lordosis, increased thoracic kyphosis, and downward  looking gaze  PALPATION: NA  LOWER EXTREMITY ROM:  Active ROM Right eval Left eval  Knee flexion    Knee extension 0 0  Ankle dorsiflexion    Ankle plantarflexion     (Blank rows = not tested)  LOWER EXTREMITY MMT:  MMT Right eval Left eval  Hip flexion 4+ 4  Hip extension 4 4  Hip abduction 5 5  Hip adduction 5 5  Knee flexion 4+ 4+  Knee extension 5 5  Ankle dorsiflexion 5 5  Ankle plantarflexion     (Blank rows = not tested)  LOWER EXTREMITY SPECIAL TESTS:  NA  FUNCTIONAL TESTS:  5 times sit to stand: 19 seconds with bil UE assist; 06/11/22 - 16 seconds bil UE support 2 minute walk test: 226 ft. Merrilee Jansky Balance Scale: 35/56 Dynamic Gait Index: 10/24  GAIT: Distance walked: 225' Assistive device utilized: Single point cane Level of assistance: Modified independence Comments: visually slow gait speed, genu valgum (L>R), L ankle pronation, decreased w/s to LLE, decreased heel strike bil.     TODAY'S TREATMENT: 06/15/2022 Therapeutic Exercise: to improve strength and mobility.  Demo, verbal and tactile cues throughout for technique. Nustep L5 x 6 min  At counter with bil UE support.  Standing hip extension 2# ankle weights x 10 bil  Standing hip abduction 2# x 10 bil  Heel/toe raises 2# x 20 At counter with 1 UE support (sideways) hip extension x 10 bil, hip abduction x 10 bil  - much more challenging.  Therapeutic Activity:  to assess progress towards goals  BERG, DGI, 6MWT - see goals.     06/11/22 Therapeutic Exercise: Nustep L5x11mn Standing trunk rotations x 10 bil Standing arm swings x 10 bil Star excursion R/L 3x 1/2 circle pattern Fwd stepping with WS and arm reach x 10 bil Standing D2 flexion bil x 10  Seated clams green TB x 12 bil Seated march green TB x 10 bil  06/08/22 Therapeutic Exercise: Nustep L5x634m LE only Standing trunk rotations x 10  Standing arm swings x 10 bil Standing bird dog at counter x 10 bil Hip extension at  counter x 10 bil Seated hip flexor stretch x 30" bil Supine windshield wipers 10x Seated hip flexor up and overs x 10 no weight; x 10 2# weight   06/05/2022 Therapeutic Exercise: to improve strength and mobility.  Demo, verbal and tactile cues throughout for technique. Nustep L5 x 6 min (LE only) At counter with 2UE support: Heel toe raises x 15 Hip extension x 10 bil  Hip abduction x 10 bil  Seated hip flexor up and overs - 2 x 10 bil Seated hip flexor stretch - difficulty with position Supine hip flexor stretch - uncomfortable Supine hip IR/ER - feels mostly in posterior hip Bridges 2 x 10  Prone hip extension 2 x 10  Prone bent knee fall-outs 2 x 10 bil - cues to keep legs bent.        PATIENT EDUCATION:  Education details: review and progression HEP 05/21/22, 05/28/22, 10/2, 10/6 Person educated: Patient Education method: Explanation, Demonstration, Verbal cues, and Handouts Education comprehension: verbalized understanding and returned demonstration   HOME EXERCISE PROGRAM: Access Code: [email or web address]:  CLINICAL IMPRESSION: KeEEVA SCHLOSSERs making good progress towards goals.  She demonstrates improved balance on Berg, improving to 44/56 almost meeting LTG #6, and was able to walk 525' in 6 minutes, almost meeting LTG #2.  She still demonstrates significant gait deviations, which worsen with fatigue and head turns during gait, increasing her risk of falls.   Today progressed exercises by decreasing UE support, also recommended getting adjustable ankle weights.  KeMarianna Paymentontinues to demonstrate potential for improvement and would benefit from continued skilled therapy to address impairments.      OBJECTIVE IMPAIRMENTS Abnormal gait, decreased balance, decreased endurance, decreased mobility, difficulty walking, decreased strength, decreased safety awareness, improper body mechanics, postural dysfunction, and pain.   ACTIVITY LIMITATIONS carrying, lifting,  bending, standing, squatting, stairs, transfers, and locomotion level  PARTICIPATION LIMITATIONS: cleaning, shopping, and community activity  PERSONAL FACTORS Age, Fitness, and 3+ comorbidities: L knee OA, R THA, HTN, essential tremor, neuropathy, parathyroid disfunction, obesity  are also affecting patient's functional outcome.   REHAB POTENTIAL: Good  CLINICAL DECISION MAKING: Evolving/moderate complexity  EVALUATION COMPLEXITY: Moderate   GOALS: Goals reviewed with patient? Yes  SHORT TERM GOALS: Target date: 05/28/2022   Patient will be independent with initial HEP. Baseline:  Goal status: MET 05/21/2022- some clarification needed.  05/28/22- good compliance  2.  Patient will demonstrate decreased fall risk by scoring < 25 sec on TUG. Baseline: NT Goal status: MET  06/01/2022: 14.3 seconds average   3.  Patient will be educated on strategies to decrease risk of falls.  Baseline: needs review Goal status:  MET  05/28/22- educated on fall risks and safety   LONG TERM GOALS: Target date: 07/09/2022   Patient will be independent with advanced/ongoing HEP to improve outcomes and carryover.  Baseline:  Goal status: IN PROGRESS  2.  Patient will be able to ambulate 600' with LRAD with good safety to access community.  Baseline: fatigues after 226 ft, SPC needed Goal status: IN PROGRESS  06/15/2022 525' without AD, dyspnea on exertion   3.  Patient will be able to step up/down curb safely with LRAD for safety with community ambulation.  Baseline: difficulty Goal status: IN PROGRESS   4.  Patient will demonstrate improved functional LE strength as demonstrated by 5x STS <15 seconds. Baseline: 19 seconds with UE assist Goal status: IN PROGRESS 06/11/22- 16 secs with UE assist  5.  Patient will demonstrate at least 19/24 on DGI to improve gait stability and reduce risk for falls. Baseline: 10/24 Goal status: IN PROGRESS 06/15/22- 13/24  6.  Patient will score 45/56 on Berg  Balance test to demonstrate lower risk of falls.   Baseline: 35/56 Goal status: IN PROGRESS 06/15/22- 44/56  7.  Patient will report 10% improvement  on ABC scale to demonstrate improved functional ability. Baseline: 800/1600 = 50% confidence Goal status: IN PROGRESS    PLAN: PT FREQUENCY: 1-2x/week  PT DURATION: 8 weeks  PLANNED INTERVENTIONS: Therapeutic exercises, Therapeutic activity, Neuromuscular re-education, Balance training, Gait training, Patient/Family education, Self Care, Joint mobilization, Stair training, Orthotic/Fit training, Dry Needling, Cryotherapy, Moist heat, Ultrasound, Manual therapy, and Re-evaluation  PLAN FOR NEXT SESSION: continue LE strengthening, gait and balance.     Rennie Natter, PT, DPT  06/15/2022, 5:59 PM

## 2022-06-17 ENCOUNTER — Ambulatory Visit: Payer: Medicare Other | Admitting: Orthopaedic Surgery

## 2022-06-18 ENCOUNTER — Ambulatory Visit: Payer: Medicare Other

## 2022-06-18 DIAGNOSIS — M6281 Muscle weakness (generalized): Secondary | ICD-10-CM

## 2022-06-18 DIAGNOSIS — R2681 Unsteadiness on feet: Secondary | ICD-10-CM | POA: Diagnosis not present

## 2022-06-18 DIAGNOSIS — R262 Difficulty in walking, not elsewhere classified: Secondary | ICD-10-CM

## 2022-06-18 DIAGNOSIS — G8929 Other chronic pain: Secondary | ICD-10-CM

## 2022-06-18 NOTE — Therapy (Signed)
OUTPATIENT PHYSICAL THERAPY TREATMENT NOTE      Patient Name: Alison Chandler MRN: [id number] DOB:11/21/1940, 81 y.o., female Today's Date: 06/18/2022   PT End of Session - 06/18/22 1401     Visit Number 11    Date for PT Re-Evaluation 07/09/22    Authorization Type Medicare + [employer]    Progress Note Due on Visit 10    PT Start Time 1316    PT Stop Time 2549    PT Time Calculation (min) 43 min    Activity Tolerance Patient tolerated treatment well    Behavior During Therapy WFL for tasks assessed/performed                 Past Medical History:  Diagnosis Date   Arthritis    Hearing loss    History of kidney stones    Hyperlipidemia    Hypertension    Neuromuscular disorder ([place])    neuropathy legs no medicine   Parathyroid dysfunction ([place])    one Quit working was removed   Tremor, essential    Past Surgical History:  Procedure Laterality Date   colonscopy  01/27/2012   JOINT REPLACEMENT Right 2013   THR   KIDNEY STONE SURGERY     ORIF TIBIA PLATEAU Right 07/15/2021   Procedure: OPEN REDUCTION INTERNAL FIXATION (ORIF) RIGHT TIBIAL PLATEAU;  Surgeon: Mcarthur Rossetti, MD;  Location: [place];  Service: Orthopedics;  Laterality: Right;   PARATHYROIDECTOMY N/A 05/10/2019   Procedure: PARATHYROIDECTOMY;  Surgeon: Fredirick Maudlin, MD;  Location: [facility];  Service: General;  Laterality: N/A;   TOTAL HIP ARTHROPLASTY  02/19/2012   Procedure: TOTAL HIP ARTHROPLASTY ANTERIOR APPROACH;  Surgeon: Mcarthur Rossetti, MD;  Location: [facility];  Service: Orthopedics;  Laterality: Right;   Patient Active Problem List   Diagnosis Date Noted   Fracture of right tibial plateau 07/15/2021   Closed fracture of right tibial plateau, initial encounter 07/15/2021   Unilateral primary osteoarthritis, left knee 12/19/2019   Benign essential hypertension 10/30/2019   Hypercalcemia 10/30/2019   Acute medial meniscal tear, right, initial encounter 08/16/2019   Iatrogenic  hypocalcemia 06/05/2019   Primary hyperparathyroidism ([place])    Nuclear sclerotic cataract of both eyes 10/05/2018   RPE mottling of macula 10/05/2018   Flat foot 06/20/2018   Tibialis posterior tendonitis, left 06/20/2018   Acute pain of right shoulder 05/04/2018   Chronic pain of left knee 12/22/2017   Subjective memory complaints 10/08/2017   It band syndrome, left 08/11/2017   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[place]) 03/23/2017   Meningioma ([place]) 03/23/2017   Idiopathic peripheral neuropathy 03/11/2017   Imbalance 03/11/2017   Paresthesias 12/06/2016   Tremor 12/06/2016   Age-related osteoporosis without current pathological fracture 12/03/2016   Nephrolithiasis 12/03/2016   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/21/2016   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   Hypertropia of left eye 09/05/2014   Intermittent exotropia of left eye 09/05/2014   Midline cystocele 05/10/2014   Stress bladder incontinence, female 05/10/2014   Benign neoplasm of colon 04/16/2014   Calculus of ureter 04/16/2014   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   Esophageal reflux 04/16/2014   Gross hematuria 04/16/2014   Headache 04/16/2014   Hypothyroidism 04/16/2014   Increased urinary frequency 04/16/2014   Keratoconjunctivitis sicca, 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 04/16/2014   Osteopenia, 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   Other seborrheic keratosis 04/16/2014   Other synovitis and tenosynovitis 04/16/2014   Presbycusis 04/16/2014   Primary localized osteoarthrosis, pelvic region and thigh 04/16/2014   Sensorineural  hearing loss 04/16/2014   Strabismus 04/16/2014   Urethral stenosis 04/16/2014   Urinary tract infection 04/16/2014   Allergic rhinitis 04/16/2014   Degenerative arthritis of hip 02/19/2012    PCP: Verdell Carmine.,  MD  REFERRING PROVIDER: Mcarthur Rossetti, MD  REFERRING DIAG: [phone] (ICD-10-CM) - Chronic pain of left knee  THERAPY DIAG:  Unsteadiness on feet  Difficulty in walking, not elsewhere  classified  Chronic pain of left knee  Muscle weakness (generalized)  Rationale for Evaluation and Treatment Rehabilitation  ONSET DATE:  fell last September fracturing R knee   SUBJECTIVE:   SUBJECTIVE STATEMENT:  Pt reports the balance feels about the same, her knee still feels painful.  PERTINENT HISTORY: R tibial fracture, R TKA, L knee OA/chronic pain, neuropathy, essential tremor, HTN, parathyroid dysfunction  PAIN:  Are you having pain? Yes: NPRS scale: 3-4/10 Pain location: L knee Pain description: sore  PRECAUTIONS: Fall  WEIGHT BEARING RESTRICTIONS No  FALLS:  Has patient fallen in last 6 months? No but feels unsteady  LIVING ENVIRONMENT: Lives with: lives with their spouse Lives in: House/apartment Stairs: Yes: Internal: 12 steps; on right going up and on left going up and External: 1 steps; none Has following equipment at home: Single point cane  OCCUPATION: retired  PLOF: Independent  PATIENT GOALS improve balance, be able to go shopping again and feel confident with balance   OBJECTIVE:   DIAGNOSTIC FINDINGS: L knee 11/06/2020 2 views of the left knee show tricompartment arthritic changes involving  mainly the lateral compartment and the patellofemoral joint.  There are  large osteophytes in all 3 compartments and significant joint space  narrowing.  PATIENT SURVEYS:  ABC scale 800/1600 = 50% = moderate level of physical functioning.   COGNITION:  Overall cognitive status: Within functional limits for tasks assessed     SENSATION: Numbness/tingling in toes both feet, diminished sensation 25% over L lateral aspect lower leg  EDEMA:  NA  MUSCLE LENGTH: NT  POSTURE: rounded shoulders, forward head, decreased lumbar lordosis, increased thoracic kyphosis, and downward looking gaze  PALPATION: NA  LOWER EXTREMITY ROM:  Active ROM Right eval Left eval  Knee flexion    Knee extension 0 0  Ankle dorsiflexion    Ankle plantarflexion      (Blank rows = not tested)  LOWER EXTREMITY MMT:  MMT Right eval Left eval  Hip flexion 4+ 4  Hip extension 4 4  Hip abduction 5 5  Hip adduction 5 5  Knee flexion 4+ 4+  Knee extension 5 5  Ankle dorsiflexion 5 5  Ankle plantarflexion     (Blank rows = not tested)  LOWER EXTREMITY SPECIAL TESTS:  NA  FUNCTIONAL TESTS:  5 times sit to stand: 19 seconds with bil UE assist; 06/11/22 - 16 seconds bil UE support 2 minute walk test: 226 ft. Merrilee Jansky Balance Scale: 35/56 Dynamic Gait Index: 10/24  GAIT: Distance walked: 225' Assistive device utilized: Single point cane Level of assistance: Modified independence Comments: visually slow gait speed, genu valgum (L>R), L ankle pronation, decreased w/s to LLE, decreased heel strike bil.     TODAY'S TREATMENT: 06/18/22 Therapeutic Exercise: to improve strength and mobility.  Demo, verbal and tactile cues throughout for technique. Nustep L6x76min Gait with head turns 3x 170 ft - intially w/o SPC but 2nd and 3rd trial with cane Star excursion 1/2 circle pattern 3x bil Standing lat pulls 15# x 20 Standing shoulder extension 15# x 12 Step ups 6' x 10  bil Seated LAQ x 10 2# Seated hip up and over 2# x 10 bil  06/15/2022 Therapeutic Exercise: to improve strength and mobility.  Demo, verbal and tactile cues throughout for technique. Nustep L5 x 6 min  At counter with bil UE support.  Standing hip extension 2# ankle weights x 10 bil  Standing hip abduction 2# x 10 bil  Heel/toe raises 2# x 20 At counter with 1 UE support (sideways) hip extension x 10 bil, hip abduction x 10 bil  - much more challenging.  Therapeutic Activity:  to assess progress towards goals  BERG, DGI, 6MWT - see goals.     06/11/22 Therapeutic Exercise: Nustep L5x48mn Standing trunk rotations x 10 bil Standing arm swings x 10 bil Star excursion R/L 3x 1/2 circle pattern Fwd stepping with WS and arm reach x 10 bil Standing D2 flexion bil x 10  Seated clams  green TB x 12 bil Seated march green TB x 10 bil  06/08/22 Therapeutic Exercise: Nustep L5x632m LE only Standing trunk rotations x 10  Standing arm swings x 10 bil Standing bird dog at counter x 10 bil Hip extension at counter x 10 bil Seated hip flexor stretch x 30" bil Supine windshield wipers 10x Seated hip flexor up and overs x 10 no weight; x 10 2# weight   06/05/2022 Therapeutic Exercise: to improve strength and mobility.  Demo, verbal and tactile cues throughout for technique. Nustep L5 x 6 min (LE only) At counter with 2UE support: Heel toe raises x 15 Hip extension x 10 bil  Hip abduction x 10 bil  Seated hip flexor up and overs - 2 x 10 bil Seated hip flexor stretch - difficulty with position Supine hip flexor stretch - uncomfortable Supine hip IR/ER - feels mostly in posterior hip Bridges 2 x 10  Prone hip extension 2 x 10  Prone bent knee fall-outs 2 x 10 bil - cues to keep legs bent.        PATIENT EDUCATION:  Education details: review and progression HEP 05/21/22, 05/28/22, 10/2, 10/6 Person educated: Patient Education method: Explanation, Demonstration, Verbal cues, and Handouts Education comprehension: verbalized understanding and returned demonstration   HOME EXERCISE PROGRAM: Access Code: [email or web address]:  CLINICAL IMPRESSION: Pt responded well. Continued to progress balance and proximal strengthening. Most difficulty was noted with gait and head turns. Cues given to hold head up for posture during exercises. She had some pain with step ups so we decided to do more seated TE to reduce strain on knees. Pt would continue to benefit from skilled PT to address remaining deficits.    OBJECTIVE IMPAIRMENTS Abnormal gait, decreased balance, decreased endurance, decreased mobility, difficulty walking, decreased strength, decreased safety awareness, improper body mechanics, postural dysfunction, and pain.   ACTIVITY LIMITATIONS carrying, lifting, bending,  standing, squatting, stairs, transfers, and locomotion level  PARTICIPATION LIMITATIONS: cleaning, shopping, and community activity  PERSONAL FACTORS Age, Fitness, and 3+ comorbidities: L knee OA, R THA, HTN, essential tremor, neuropathy, parathyroid disfunction, obesity  are also affecting patient's functional outcome.   REHAB POTENTIAL: Good  CLINICAL DECISION MAKING: Evolving/moderate complexity  EVALUATION COMPLEXITY: Moderate   GOALS: Goals reviewed with patient? Yes  SHORT TERM GOALS: Target date: 05/28/2022   Patient will be independent with initial HEP. Baseline:  Goal status: MET 05/21/2022- some clarification needed.  05/28/22- good compliance  2.  Patient will demonstrate decreased fall risk by scoring < 25 sec on TUG. Baseline: NT Goal status: MET  06/01/2022: 14.3  seconds average   3.  Patient will be educated on strategies to decrease risk of falls.  Baseline: needs review Goal status: MET  05/28/22- educated on fall risks and safety   LONG TERM GOALS: Target date: 07/09/2022   Patient will be independent with advanced/ongoing HEP to improve outcomes and carryover.  Baseline:  Goal status: IN PROGRESS  2.  Patient will be able to ambulate 600' with LRAD with good safety to access community.  Baseline: fatigues after 226 ft, SPC needed Goal status: IN PROGRESS  06/15/2022 525' without AD, dyspnea on exertion   3.  Patient will be able to step up/down curb safely with LRAD for safety with community ambulation.  Baseline: difficulty Goal status: IN PROGRESS   4.  Patient will demonstrate improved functional LE strength as demonstrated by 5x STS <15 seconds. Baseline: 19 seconds with UE assist Goal status: IN PROGRESS 06/11/22- 16 secs with UE assist  5.  Patient will demonstrate at least 19/24 on DGI to improve gait stability and reduce risk for falls. Baseline: 10/24 Goal status: IN PROGRESS 06/15/22- 13/24  6.  Patient will score 45/56 on Berg Balance test  to demonstrate lower risk of falls.   Baseline: 35/56 Goal status: IN PROGRESS 06/15/22- 44/56  7.  Patient will report 10% improvement  on ABC scale to demonstrate improved functional ability. Baseline: 800/1600 = 50% confidence Goal status: IN PROGRESS    PLAN: PT FREQUENCY: 1-2x/week  PT DURATION: 8 weeks  PLANNED INTERVENTIONS: Therapeutic exercises, Therapeutic activity, Neuromuscular re-education, Balance training, Gait training, Patient/Family education, Self Care, Joint mobilization, Stair training, Orthotic/Fit training, Dry Needling, Cryotherapy, Moist heat, Ultrasound, Manual therapy, and Re-evaluation  PLAN FOR NEXT SESSION: continue LE strengthening, gait and balance.     Artist Pais, PTA 06/18/2022, 2:02 PM

## 2022-06-22 ENCOUNTER — Ambulatory Visit (INDEPENDENT_AMBULATORY_CARE_PROVIDER_SITE_OTHER): Payer: Medicare Other | Admitting: Orthopaedic Surgery

## 2022-06-22 ENCOUNTER — Encounter: Payer: Self-pay | Admitting: Orthopaedic Surgery

## 2022-06-22 ENCOUNTER — Ambulatory Visit: Payer: Medicare Other

## 2022-06-22 DIAGNOSIS — M1712 Unilateral primary osteoarthritis, left knee: Secondary | ICD-10-CM | POA: Diagnosis not present

## 2022-06-22 DIAGNOSIS — M6281 Muscle weakness (generalized): Secondary | ICD-10-CM

## 2022-06-22 DIAGNOSIS — G8929 Other chronic pain: Secondary | ICD-10-CM

## 2022-06-22 DIAGNOSIS — R2681 Unsteadiness on feet: Secondary | ICD-10-CM

## 2022-06-22 DIAGNOSIS — R262 Difficulty in walking, not elsewhere classified: Secondary | ICD-10-CM

## 2022-06-22 MED ORDER — HYALURONAN 88 MG/4ML IX SOSY
88.0000 mg | PREFILLED_SYRINGE | INTRA_ARTICULAR | Status: AC | PRN
  Administered 2022-06-22: 88 mg via INTRA_ARTICULAR

## 2022-06-22 NOTE — Therapy (Signed)
OUTPATIENT PHYSICAL THERAPY TREATMENT NOTE      Patient Name: Alison Chandler MRN: [id number] DOB:[birth year], 81 y.o., female Today's Date: 06/22/2022   PT End of Session - 06/22/22 1333     Visit Number 12    Date for PT Re-Evaluation 07/09/22    Authorization Type Medicare + Aetna    PT Start Time 1315    PT Stop Time 1357    PT Time Calculation (min) 42 min    Activity Tolerance Patient tolerated treatment well    Behavior During Therapy WFL for tasks assessed/performed                  Past Medical History:  Diagnosis Date   Arthritis    Hearing loss    History of kidney stones    Hyperlipidemia    Hypertension    Neuromuscular disorder ([place])    neuropathy legs no medicine   Parathyroid dysfunction ([place])    one Quit working was removed   Tremor, essential    Past Surgical History:  Procedure Laterality Date   colonscopy  01/27/2012   JOINT REPLACEMENT Right 2013   THR   KIDNEY STONE SURGERY     ORIF TIBIA PLATEAU Right 07/15/2021   Procedure: OPEN REDUCTION INTERNAL FIXATION (ORIF) RIGHT TIBIAL PLATEAU;  Surgeon: Mcarthur Rossetti, MD;  Location: [place];  Service: Orthopedics;  Laterality: Right;   PARATHYROIDECTOMY N/A 05/10/2019   Procedure: PARATHYROIDECTOMY;  Surgeon: Fredirick Maudlin, MD;  Location: [facility];  Service: General;  Laterality: N/A;   TOTAL HIP ARTHROPLASTY  02/19/2012   Procedure: TOTAL HIP ARTHROPLASTY ANTERIOR APPROACH;  Surgeon: Mcarthur Rossetti, MD;  Location: [facility];  Service: Orthopedics;  Laterality: Right;   Patient Active Problem List   Diagnosis Date Noted   Fracture of right tibial plateau 07/15/2021   Closed fracture of right tibial plateau, initial encounter 07/15/2021   Unilateral primary osteoarthritis, left knee 12/19/2019   Benign essential hypertension 10/30/2019   Hypercalcemia 10/30/2019   Acute medial meniscal tear, right, initial encounter 08/16/2019   Iatrogenic hypocalcemia 06/05/2019   Primary  hyperparathyroidism ([place])    Nuclear sclerotic cataract of both eyes 10/05/2018   RPE mottling of macula 10/05/2018   Flat foot 06/20/2018   Tibialis posterior tendonitis, left 06/20/2018   Acute pain of right shoulder 05/04/2018   Chronic pain of left knee 12/22/2017   Subjective memory complaints 10/08/2017   It band syndrome, left 08/11/2017   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[place]) 03/23/2017   Meningioma ([place]) 03/23/2017   Idiopathic peripheral neuropathy 03/11/2017   Imbalance 03/11/2017   Paresthesias 12/06/2016   Tremor 12/06/2016   Age-related osteoporosis without current pathological fracture 12/03/2016   Nephrolithiasis 12/03/2016   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/21/2016   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   Hypertropia of left eye 09/05/2014   Intermittent exotropia of left eye 09/05/2014   Midline cystocele 05/10/2014   Stress bladder incontinence, female 05/10/2014   Benign neoplasm of colon 04/16/2014   Calculus of ureter 04/16/2014   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   Esophageal reflux 04/16/2014   Gross hematuria 04/16/2014   Headache 04/16/2014   Hypothyroidism 04/16/2014   Increased urinary frequency 04/16/2014   Keratoconjunctivitis sicca, 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 04/16/2014   Osteopenia, 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   Other seborrheic keratosis 04/16/2014   Other synovitis and tenosynovitis 04/16/2014   Presbycusis 04/16/2014   Primary localized osteoarthrosis, pelvic region and thigh 04/16/2014   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   Strabismus 04/16/2014  Urethral stenosis 04/16/2014   Urinary tract infection 04/16/2014   Allergic rhinitis 04/16/2014   Degenerative arthritis of hip 02/19/2012    PCP: Verdell Carmine.,  MD  REFERRING PROVIDER: Mcarthur Rossetti, MD  REFERRING DIAG: [phone] (ICD-10-CM) - Chronic pain of left knee  THERAPY DIAG:  Unsteadiness on feet  Difficulty in walking, not elsewhere classified  Chronic pain of left  knee  Muscle weakness (generalized)  Rationale for Evaluation and Treatment Rehabilitation  ONSET DATE:  fell last September fracturing R knee   SUBJECTIVE:   SUBJECTIVE STATEMENT:  The L knee is still achy, Friday and Saturday was bad.  PERTINENT HISTORY: R tibial fracture, R TKA, L knee OA/chronic pain, neuropathy, essential tremor, HTN, parathyroid dysfunction  PAIN:  Are you having pain? Yes: NPRS scale: 3-4/10 Pain location: L knee Pain description: sore  PRECAUTIONS: Fall  WEIGHT BEARING RESTRICTIONS No  FALLS:  Has patient fallen in last 6 months? No but feels unsteady  LIVING ENVIRONMENT: Lives with: lives with their spouse Lives in: House/apartment Stairs: Yes: Internal: 12 steps; on right going up and on left going up and External: 1 steps; none Has following equipment at home: Single point cane  OCCUPATION: retired  PLOF: Independent  PATIENT GOALS improve balance, be able to go shopping again and feel confident with balance   OBJECTIVE:   DIAGNOSTIC FINDINGS: L knee 11/06/2020 2 views of the left knee show tricompartment arthritic changes involving  mainly the lateral compartment and the patellofemoral joint.  There are  large osteophytes in all 3 compartments and significant joint space  narrowing.  PATIENT SURVEYS:  ABC scale 800/1600 = 50% = moderate level of physical functioning.   COGNITION:  Overall cognitive status: Within functional limits for tasks assessed     SENSATION: Numbness/tingling in toes both feet, diminished sensation 25% over L lateral aspect lower leg  EDEMA:  NA  MUSCLE LENGTH: NT  POSTURE: rounded shoulders, forward head, decreased lumbar lordosis, increased thoracic kyphosis, and downward looking gaze  PALPATION: NA  LOWER EXTREMITY ROM:  Active ROM Right eval Left eval  Knee flexion    Knee extension 0 0  Ankle dorsiflexion    Ankle plantarflexion     (Blank rows = not tested)  LOWER EXTREMITY  MMT:  MMT Right eval Left eval  Hip flexion 4+ 4  Hip extension 4 4  Hip abduction 5 5  Hip adduction 5 5  Knee flexion 4+ 4+  Knee extension 5 5  Ankle dorsiflexion 5 5  Ankle plantarflexion     (Blank rows = not tested)  LOWER EXTREMITY SPECIAL TESTS:  NA  FUNCTIONAL TESTS:  5 times sit to stand: 19 seconds with bil UE assist; 06/11/22 - 16 seconds bil UE support 2 minute walk test: 226 ft. Merrilee Jansky Balance Scale: 35/56 Dynamic Gait Index: 10/24  GAIT: Distance walked: 225' Assistive device utilized: Single point cane Level of assistance: Modified independence Comments: visually slow gait speed, genu valgum (L>R), L ankle pronation, decreased w/s to LLE, decreased heel strike bil.     TODAY'S TREATMENT: 06/22/22 Therapeutic Exercise: to improve strength and mobility.  Demo, verbal and tactile cues throughout for technique. Nustep L6x63mn Star excursion 3x each LE 1/2 circle pattern  Standing chops with light green ball 2 x 10 Standing rows 15# 2x10 Step ups 6'- up with R down with R 2x10 Knee extension 10# x 15 BLE Knee flexion 25# x 15 BLE   06/18/22 Therapeutic Exercise: to improve strength and  mobility.  Demo, verbal and tactile cues throughout for technique. Nustep L6x3mn Gait with head turns 3x 170 ft - intially w/o SPC but 2nd and 3rd trial with cane Star excursion 1/2 circle pattern 3x bil Standing lat pulls 15# x 20 Standing shoulder extension 15# x 12 Step ups 6' x 10 bil Seated LAQ x 10 2# Seated hip up and over 2# x 10 bil  06/15/2022 Therapeutic Exercise: to improve strength and mobility.  Demo, verbal and tactile cues throughout for technique. Nustep L5 x 6 min  At counter with bil UE support.  Standing hip extension 2# ankle weights x 10 bil  Standing hip abduction 2# x 10 bil  Heel/toe raises 2# x 20 At counter with 1 UE support (sideways) hip extension x 10 bil, hip abduction x 10 bil  - much more challenging.  Therapeutic Activity:  to  assess progress towards goals  BERG, DGI, 6MWT - see goals.       PATIENT EDUCATION:  Education details: review and progression HEP 05/21/22, 05/28/22, 10/2, 10/6 Person educated: Patient Education method: Explanation, Demonstration, Verbal cues, and Handouts Education comprehension: verbalized understanding and returned demonstration   HOME EXERCISE PROGRAM: Access Code: [email or web address] ASSESSMENT:  CLINICAL IMPRESSION: Pt responded well to treatment. We continued to progress core stability and proximal strengthening. She continues to be challenged with star excursion, posterior WS mostly. She was unable to initiate step up with LLE today d/t pain. We were able to work with LE weight machines w/o issues. She is seeing Dr. BNinfa Lindentoday and hopes to get more gel injections to help with increased L knee pain.    OBJECTIVE IMPAIRMENTS Abnormal gait, decreased balance, decreased endurance, decreased mobility, difficulty walking, decreased strength, decreased safety awareness, improper body mechanics, postural dysfunction, and pain.   ACTIVITY LIMITATIONS carrying, lifting, bending, standing, squatting, stairs, transfers, and locomotion level  PARTICIPATION LIMITATIONS: cleaning, shopping, and community activity  PERSONAL FACTORS Age, Fitness, and 3+ comorbidities: L knee OA, R THA, HTN, essential tremor, neuropathy, parathyroid disfunction, obesity  are also affecting patient's functional outcome.   REHAB POTENTIAL: Good  CLINICAL DECISION MAKING: Evolving/moderate complexity  EVALUATION COMPLEXITY: Moderate   GOALS: Goals reviewed with patient? Yes  SHORT TERM GOALS: Target date: 05/28/2022   Patient will be independent with initial HEP. Baseline:  Goal status: MET 05/21/2022- some clarification needed.  05/28/22- good compliance  2.  Patient will demonstrate decreased fall risk by scoring < 25 sec on TUG. Baseline: NT Goal status: MET  06/01/2022: 14.3 seconds average   3.   Patient will be educated on strategies to decrease risk of falls.  Baseline: needs review Goal status: MET  05/28/22- educated on fall risks and safety   LONG TERM GOALS: Target date: 07/09/2022   Patient will be independent with advanced/ongoing HEP to improve outcomes and carryover.  Baseline:  Goal status: IN PROGRESS  2.  Patient will be able to ambulate 600' with LRAD with good safety to access community.  Baseline: fatigues after 226 ft, SPC needed Goal status: IN PROGRESS  06/15/2022 525' without AD, dyspnea on exertion   3.  Patient will be able to step up/down curb safely with LRAD for safety with community ambulation.  Baseline: difficulty Goal status: IN PROGRESS   4.  Patient will demonstrate improved functional LE strength as demonstrated by 5x STS <15 seconds. Baseline: 19 seconds with UE assist Goal status: IN PROGRESS 06/11/22- 16 secs with UE assist  5.  Patient will demonstrate at  least 19/24 on DGI to improve gait stability and reduce risk for falls. Baseline: 10/24 Goal status: IN PROGRESS 06/15/22- 13/24  6.  Patient will score 45/56 on Berg Balance test to demonstrate lower risk of falls.   Baseline: 35/56 Goal status: IN PROGRESS 06/15/22- 44/56  7.  Patient will report 10% improvement  on ABC scale to demonstrate improved functional ability. Baseline: 800/1600 = 50% confidence Goal status: IN PROGRESS    PLAN: PT FREQUENCY: 1-2x/week  PT DURATION: 8 weeks  PLANNED INTERVENTIONS: Therapeutic exercises, Therapeutic activity, Neuromuscular re-education, Balance training, Gait training, Patient/Family education, Self Care, Joint mobilization, Stair training, Orthotic/Fit training, Dry Needling, Cryotherapy, Moist heat, Ultrasound, Manual therapy, and Re-evaluation  PLAN FOR NEXT SESSION: continue LE strengthening, gait and balance.     Artist Pais, PTA 06/22/2022, 1:59 PM

## 2022-06-22 NOTE — Progress Notes (Signed)
   Procedure Note  Patient: Alison Chandler             Date of Birth: 1940-11-22           MRN: [id number]             Visit Date: 06/22/2022  Procedures: Visit Diagnoses:  1. Unilateral primary osteoarthritis, left knee     Large Joint Inj: L knee on 06/22/2022 2:55 PM Indications: diagnostic evaluation and pain Details: 22 G 1.5 in needle, superolateral approach  Arthrogram: No  Medications: 88 mg Hyaluronan 88 MG/4ML Outcome: tolerated well, no immediate complications Procedure, treatment alternatives, risks and benefits explained, specific risks discussed. Consent was given by the patient. Immediately prior to procedure a time out was called to verify the correct patient, procedure, equipment, support staff and site/side marked as required. Patient was prepped and draped in the usual sterile fashion.    The patient has known osteoarthritis of her left knee.  She is here today for scheduled hyaluronic acid injection with Monovisc to treat the pain from osteoarthritis.  She has had an injection like this before.  She is still in outpatient physical therapy and has about 2 more weeks of therapy helping with her balance and coordination as well as strengthening.  She has been very pleased with therapy.  I did place Monovisc in her left knee today without difficulty.  I would like to see her back in 3 months to see how she is doing overall.

## 2022-06-25 ENCOUNTER — Encounter: Payer: Self-pay | Admitting: Physical Therapy

## 2022-06-25 ENCOUNTER — Ambulatory Visit: Payer: Medicare Other | Admitting: Physical Therapy

## 2022-06-25 DIAGNOSIS — M6281 Muscle weakness (generalized): Secondary | ICD-10-CM

## 2022-06-25 DIAGNOSIS — R262 Difficulty in walking, not elsewhere classified: Secondary | ICD-10-CM

## 2022-06-25 DIAGNOSIS — R2681 Unsteadiness on feet: Secondary | ICD-10-CM

## 2022-06-25 DIAGNOSIS — G8929 Other chronic pain: Secondary | ICD-10-CM

## 2022-06-25 NOTE — Therapy (Signed)
OUTPATIENT PHYSICAL THERAPY TREATMENT NOTE      Patient Name: Alison Chandler MRN: [id number] DOB:Aug 27, 1941, 81 y.o., female Today's Date: 06/25/2022   PT End of Session - 06/25/22 1318     Visit Number 13    Date for PT Re-Evaluation 07/09/22    Authorization Type Medicare + [employer]    PT Start Time 1316    PT Stop Time 8144    PT Time Calculation (min) 43 min    Activity Tolerance Patient tolerated treatment well    Behavior During Therapy WFL for tasks assessed/performed                  Past Medical History:  Diagnosis Date   Arthritis    Hearing loss    History of kidney stones    Hyperlipidemia    Hypertension    Neuromuscular disorder ([place])    neuropathy legs no medicine   Parathyroid dysfunction ([place])    one Quit working was removed   Tremor, essential    Past Surgical History:  Procedure Laterality Date   colonscopy  01/27/2012   JOINT REPLACEMENT Right 2013   THR   KIDNEY STONE SURGERY     ORIF TIBIA PLATEAU Right 07/15/2021   Procedure: OPEN REDUCTION INTERNAL FIXATION (ORIF) RIGHT TIBIAL PLATEAU;  Surgeon: Mcarthur Rossetti, MD;  Location: [facility];  Service: Orthopedics;  Laterality: Right;   PARATHYROIDECTOMY N/A 05/10/2019   Procedure: PARATHYROIDECTOMY;  Surgeon: Fredirick Maudlin, MD;  Location: [facility];  Service: General;  Laterality: N/A;   TOTAL HIP ARTHROPLASTY  02/19/2012   Procedure: TOTAL HIP ARTHROPLASTY ANTERIOR APPROACH;  Surgeon: Mcarthur Rossetti, MD;  Location: [facility];  Service: Orthopedics;  Laterality: Right;   Patient Active Problem List   Diagnosis Date Noted   Fracture of right tibial plateau 07/15/2021   Closed fracture of right tibial plateau, initial encounter 07/15/2021   Unilateral primary osteoarthritis, left knee 12/19/2019   Benign essential hypertension 10/30/2019   Hypercalcemia 10/30/2019   Acute medial meniscal tear, right, initial encounter 08/16/2019   Iatrogenic hypocalcemia 06/05/2019   Primary  hyperparathyroidism ([place])    Nuclear sclerotic cataract of both eyes 10/05/2018   RPE mottling of macula 10/05/2018   Flat foot 06/20/2018   Tibialis posterior tendonitis, left 06/20/2018   Acute pain of right shoulder 05/04/2018   Chronic pain of left knee 12/22/2017   Subjective memory complaints 10/08/2017   It band syndrome, left 08/11/2017   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/2017   Meningioma ([place]) 03/23/2017   Idiopathic peripheral neuropathy 03/11/2017   Imbalance 03/11/2017   Paresthesias 12/06/2016   Tremor 12/06/2016   Age-related osteoporosis without current pathological fracture 12/03/2016   Nephrolithiasis 12/03/2016   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/21/2016   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   Hypertropia of left eye 09/05/2014   Intermittent exotropia of left eye 09/05/2014   Midline cystocele 05/10/2014   Stress bladder incontinence, female 05/10/2014   Benign neoplasm of colon 04/16/2014   Calculus of ureter 04/16/2014   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   Esophageal reflux 04/16/2014   Gross hematuria 04/16/2014   Headache 04/16/2014   Hypothyroidism 04/16/2014   Increased urinary frequency 04/16/2014   Keratoconjunctivitis sicca, 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 04/16/2014   Osteopenia, 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   Other seborrheic keratosis 04/16/2014   Other synovitis and tenosynovitis 04/16/2014   Presbycusis 04/16/2014   Primary localized osteoarthrosis, pelvic region and thigh 04/16/2014   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   Strabismus 04/16/2014  Urethral stenosis 04/16/2014   Urinary tract infection 04/16/2014   Allergic rhinitis 04/16/2014   Degenerative arthritis of hip 02/19/2012    PCP: Verdell Carmine.,  MD  REFERRING PROVIDER: Mcarthur Rossetti, MD  REFERRING DIAG: [id number] (ICD-10-CM) - Chronic pain of left knee  THERAPY DIAG:  Unsteadiness on feet  Difficulty in walking, not elsewhere classified  Chronic pain of left  knee  Muscle weakness (generalized)  Rationale for Evaluation and Treatment Rehabilitation  ONSET DATE:  fell last September fracturing R knee   SUBJECTIVE:   SUBJECTIVE STATEMENT:  Alison Chandler reports she got the gel shot yesterday in her L knee, so it is sore today.   PERTINENT HISTORY: R tibial fracture, R TKA, L knee OA/chronic pain, neuropathy, essential tremor, HTN, parathyroid dysfunction  PAIN:  Are you having pain? Yes: NPRS scale: 3/10 Pain location: L knee Pain description: sore  PRECAUTIONS: Fall  WEIGHT BEARING RESTRICTIONS No  FALLS:  Has patient fallen in last 6 months? No but feels unsteady  LIVING ENVIRONMENT: Lives with: lives with their spouse Lives in: House/apartment Stairs: Yes: Internal: 12 steps; on right going up and on left going up and External: 1 steps; none Has following equipment at home: Single point cane  OCCUPATION: retired  PLOF: Independent  PATIENT GOALS improve balance, be able to go shopping again and feel confident with balance   OBJECTIVE:   DIAGNOSTIC FINDINGS: L knee 11/06/2020 2 views of the left knee show tricompartment arthritic changes involving  mainly the lateral compartment and the patellofemoral joint.  There are  large osteophytes in all 3 compartments and significant joint space  narrowing.  PATIENT SURVEYS:  ABC scale 800/1600 = 50% = moderate level of physical functioning.   COGNITION:  Overall cognitive status: Within functional limits for tasks assessed     SENSATION: Numbness/tingling in toes both feet, diminished sensation 25% over L lateral aspect lower leg  EDEMA:  NA  MUSCLE LENGTH: NT  POSTURE: rounded shoulders, forward head, decreased lumbar lordosis, increased thoracic kyphosis, and downward looking gaze  PALPATION: NA  LOWER EXTREMITY ROM:  Active ROM Right eval Left eval  Knee flexion    Knee extension 0 0  Ankle dorsiflexion    Ankle plantarflexion     (Blank rows = not  tested)  LOWER EXTREMITY MMT:  MMT Right eval Left eval  Hip flexion 4+ 4  Hip extension 4 4  Hip abduction 5 5  Hip adduction 5 5  Knee flexion 4+ 4+  Knee extension 5 5  Ankle dorsiflexion 5 5  Ankle plantarflexion     (Blank rows = not tested)  LOWER EXTREMITY SPECIAL TESTS:  NA  FUNCTIONAL TESTS:  5 times sit to stand: 19 seconds with bil UE assist; 06/11/22 - 16 seconds bil UE support 2 minute walk test: 226 ft. Merrilee Jansky Balance Scale: 35/56 Dynamic Gait Index: 10/24  GAIT: Distance walked: 225' Assistive device utilized: Single point cane Level of assistance: Modified independence Comments: visually slow gait speed, genu valgum (L>R), L ankle pronation, decreased w/s to LLE, decreased heel strike bil.    VESTIBULAR ASSESSMENT:  GENERAL OBSERVATION: wearing glasses with prisms, no apparent distress.    SYMPTOM BEHAVIOR:  Subjective history: unsteady, difficulty with head turns, and looking up with walking, feels like she is going to falldiplopia  Non-Vestibular symptoms:   Type of dizziness: Unsteady with head/body turns  Frequency: daily  Aggravating factors: Induced by position change: turning head and looking up  Relieving  factors: head stationary  Progression of symptoms: unchanged  OCULOMOTOR EXAM:  Ocular Alignment: normal  Ocular ROM:  decreased with looking up, decreased peripheral vision.   Spontaneous Nystagmus: absent  Gaze-Induced Nystagmus: age appropriate nystagmus at end range  Smooth Pursuits: saccades  Saccades: hypometric/undershoots  Convergence/Divergence: 10 cm   VESTIBULAR - OCULAR REFLEX:   Slow VOR: Positive Right  VOR Cancellation: Corrective Saccades  Head-Impulse Test: HIT Right: negative HIT Left: negative  Dynamic Visual Acuity:  NT today   POSITIONAL TESTING: NT    MOTION SENSITIVITY:  Motion Sensitivity Quotient Intensity: 0 = none, 1 = Lightheaded, 2 = Mild, 3 = Moderate, 4 = Severe, 5 = Vomiting  Intensity  1.  Sitting to supine 0  2. Supine to L side 0  3. Supine to R side 0  4. Supine to sitting 0  5. L Hallpike-Dix   6. Up from L    7. R Hallpike-Dix   8. Up from R    9. Sitting, head tipped to L knee 0  10. Head up from L knee 0  11. Sitting, head tipped to R knee 0  12. Head up from R knee 0 - but cervical extension gives mild symptoms  13. Sitting head turns x5 0  14.Sitting head nods x5 0  15. In stance, 180 turn to L    16. In stance, 180 turn to R          TODAY'S TREATMENT: 06/25/2022 Therapeutic Exercise: Nustep L6 x 6 min  Reevaluation -  to assess vestibular system. See Vestibular assessment above.  Neuromuscular Reeducation: to improve balance and stability. SBA for safety throughout.  Gaze Adaptation:  x1 Viewing Horizontal: Position: seated, Reps: 3 x 15, and Comment: cues needed  and x1 Viewing Vertical:  Position: seated, Reps: 2 x 15, and Comment: cues needed   06/22/22 Therapeutic Exercise: to improve strength and mobility.  Demo, verbal and tactile cues throughout for technique. Nustep L6x30min Star excursion 3x each LE 1/2 circle pattern  Standing chops with light green ball 2 x 10 Standing rows 15# 2x10 Step ups 6'- up with R down with R 2x10 Knee extension 10# x 15 BLE Knee flexion 25# x 15 BLE   06/18/22 Therapeutic Exercise: to improve strength and mobility.  Demo, verbal and tactile cues throughout for technique. Nustep L6x30min Gait with head turns 3x 170 ft - intially w/o SPC but 2nd and 3rd trial with cane Star excursion 1/2 circle pattern 3x bil Standing lat pulls 15# x 20 Standing shoulder extension 15# x 12 Step ups 6' x 10 bil Seated LAQ x 10 2# Seated hip up and over 2# x 10 bil   PATIENT EDUCATION:  Education details: review and progression HEP 05/21/22, 05/28/22, 10/2, 10/6, 10/26 Person educated: Patient Education method: Consulting civil engineer, Demonstration, Verbal cues, and Handouts Education comprehension: verbalized understanding and  returned demonstration   HOME EXERCISE PROGRAM: Access Code: [email or web address] URL: [email or web address] Date: 06/25/2022 Prepared by: Glenetta Hew  Exercises Added  - Seated Gaze Stabilization with Head Rotation  - 3 x daily - 7 x weekly - 3 sets - 15 reps - Seated Gaze Stabilization with Head Nod  - 3 x daily - 7 x weekly - 3 sets - 15 reps  ASSESSMENT:  CLINICAL IMPRESSION: Alison Chandler reported today continued concerns about her balance impairments, reporting that she felt very unstable with head turns last session, and wondering if it could be issue with brain.  She reported in  20s was diagnosed with long standing vertical diplopia and prisms placed in glasses, not till much later did she have MRI of brain in which meningioma was discovered encroaching on L optic nerve, this was treated with gamma knife on 08/01/2020.  Today evaluated vestibular system, noted she had increased saccades with looking to the right, and difficulty with upward gaze.  She did not have any dizziness with head movements in sitting, and denied any spinning rolling over in bad, so did not assess canals as BPPV not likely.  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  Alison Chandler continues to demonstrate potential for improvement and would benefit from continued skilled therapy to address impairments.       OBJECTIVE IMPAIRMENTS Abnormal gait, decreased balance, decreased endurance, decreased mobility, difficulty walking, decreased strength, decreased safety awareness, improper body mechanics, postural dysfunction, and pain.   ACTIVITY LIMITATIONS carrying, lifting, bending, standing, squatting, stairs, transfers, and locomotion level  PARTICIPATION LIMITATIONS: cleaning, shopping, and community activity  PERSONAL FACTORS Age, Fitness, and 3+ comorbidities: L knee OA, R THA, HTN, essential tremor, neuropathy,  parathyroid disfunction, obesity  are also affecting patient's functional outcome.   REHAB POTENTIAL: Good  CLINICAL DECISION MAKING: Evolving/moderate complexity  EVALUATION COMPLEXITY: Moderate   GOALS: Goals reviewed with patient? Yes  SHORT TERM GOALS: Target date: 05/28/2022   Patient will be independent with initial HEP. Baseline:  Goal status: MET 05/21/2022- some clarification needed.  05/28/22- good compliance  2.  Patient will demonstrate decreased fall risk by scoring < 25 sec on TUG. Baseline: NT Goal status: MET  06/01/2022: 14.3 seconds average   3.  Patient will be educated on strategies to decrease risk of falls.  Baseline: needs review Goal status: MET  05/28/22- educated on fall risks and safety   LONG TERM GOALS: Target date: 07/09/2022   Patient will be independent with advanced/ongoing HEP to improve outcomes and carryover.  Baseline:  Goal status: IN PROGRESS  2.  Patient will be able to ambulate 600' with LRAD with good safety to access community.  Baseline: fatigues after 226 ft, SPC needed Goal status: IN PROGRESS  06/15/2022 525' without AD, dyspnea on exertion   3.  Patient will be able to step up/down curb safely with LRAD for safety with community ambulation.  Baseline: difficulty Goal status: IN PROGRESS   4.  Patient will demonstrate improved functional LE strength as demonstrated by 5x STS <15 seconds. Baseline: 19 seconds with UE assist Goal status: IN PROGRESS 06/11/22- 16 secs with UE assist  5.  Patient will demonstrate at least 19/24 on DGI to improve gait stability and reduce risk for falls. Baseline: 10/24 Goal status: IN PROGRESS 06/15/22- 13/24  6.  Patient will score 45/56 on Berg Balance test to demonstrate lower risk of falls.   Baseline: 35/56 Goal status: IN PROGRESS 06/15/22- 44/56  7.  Patient will report 10% improvement  on ABC scale to demonstrate improved functional ability. Baseline: 800/1600 = 50% confidence Goal  status: IN PROGRESS    PLAN: PT FREQUENCY: 1-2x/week  PT DURATION: 8 weeks  PLANNED INTERVENTIONS: Therapeutic exercises, Therapeutic activity, Neuromuscular re-education, Balance training, Gait training, Patient/Family education, Self Care, Joint mobilization, Stair training, Orthotic/Fit training, Dry Needling, Cryotherapy, Moist heat, Ultrasound, Manual therapy, and Re-evaluation  PLAN FOR NEXT SESSION: continue LE strengthening, gait and balance.   Added VOR exercises, work on movements with head extension (reaching  and looking up).   Rennie Natter, PT, DPT  06/25/2022, 4:41 PM

## 2022-06-29 ENCOUNTER — Encounter: Payer: Self-pay | Admitting: Physical Therapy

## 2022-06-29 ENCOUNTER — Ambulatory Visit: Payer: Medicare Other | Admitting: Physical Therapy

## 2022-06-29 DIAGNOSIS — R2681 Unsteadiness on feet: Secondary | ICD-10-CM | POA: Diagnosis not present

## 2022-06-29 DIAGNOSIS — M6281 Muscle weakness (generalized): Secondary | ICD-10-CM

## 2022-06-29 DIAGNOSIS — R262 Difficulty in walking, not elsewhere classified: Secondary | ICD-10-CM

## 2022-06-29 DIAGNOSIS — G8929 Other chronic pain: Secondary | ICD-10-CM

## 2022-06-29 NOTE — Therapy (Signed)
OUTPATIENT PHYSICAL THERAPY TREATMENT NOTE      Patient Name: Alison Chandler MRN: [id number] DOB:06/02/1941, 81 y.o., female Today's Date: 06/29/2022   PT End of Session - 06/29/22 1317     Visit Number 14    Date for PT Re-Evaluation 07/09/22    Authorization Type Medicare + [employer]    PT Start Time 1315    PT Stop Time 7824    PT Time Calculation (min) 41 min    Activity Tolerance Patient tolerated treatment well    Behavior During Therapy WFL for tasks assessed/performed                  Past Medical History:  Diagnosis Date   Arthritis    Hearing loss    History of kidney stones    Hyperlipidemia    Hypertension    Neuromuscular disorder ([place])    neuropathy legs no medicine   Parathyroid dysfunction ([place])    one Quit working was removed   Tremor, essential    Past Surgical History:  Procedure Laterality Date   colonscopy  01/27/2012   JOINT REPLACEMENT Right 2013   THR   KIDNEY STONE SURGERY     ORIF TIBIA PLATEAU Right 07/15/2021   Procedure: OPEN REDUCTION INTERNAL FIXATION (ORIF) RIGHT TIBIAL PLATEAU;  Surgeon: Mcarthur Rossetti, MD;  Location: [facility];  Service: Orthopedics;  Laterality: Right;   PARATHYROIDECTOMY N/A 05/10/2019   Procedure: PARATHYROIDECTOMY;  Surgeon: Fredirick Maudlin, MD;  Location: [facility];  Service: General;  Laterality: N/A;   TOTAL HIP ARTHROPLASTY  02/19/2012   Procedure: TOTAL HIP ARTHROPLASTY ANTERIOR APPROACH;  Surgeon: Mcarthur Rossetti, MD;  Location: [facility];  Service: Orthopedics;  Laterality: Right;   Patient Active Problem List   Diagnosis Date Noted   Fracture of right tibial plateau 07/15/2021   Closed fracture of right tibial plateau, initial encounter 07/15/2021   Unilateral primary osteoarthritis, left knee 12/19/2019   Benign essential hypertension 10/30/2019   Hypercalcemia 10/30/2019   Acute medial meniscal tear, right, initial encounter 08/16/2019   Iatrogenic hypocalcemia 06/05/2019   Primary  hyperparathyroidism ([place])    Nuclear sclerotic cataract of both eyes 10/05/2018   RPE mottling of macula 10/05/2018   Flat foot 06/20/2018   Tibialis posterior tendonitis, left 06/20/2018   Acute pain of right shoulder 05/04/2018   Chronic pain of left knee 12/22/2017   Subjective memory complaints 10/08/2017   It band syndrome, left 08/11/2017   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[place]) 03/23/2017   Meningioma ([place]) 03/23/2017   Idiopathic peripheral neuropathy 03/11/2017   Imbalance 03/11/2017   Paresthesias 12/06/2016   Tremor 12/06/2016   Age-related osteoporosis without current pathological fracture 12/03/2016   Nephrolithiasis 12/03/2016   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/21/2016   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   Hypertropia of left eye 09/05/2014   Intermittent exotropia of left eye 09/05/2014   Midline cystocele 05/10/2014   Stress bladder incontinence, female 05/10/2014   Benign neoplasm of colon 04/16/2014   Calculus of ureter 04/16/2014   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   Esophageal reflux 04/16/2014   Gross hematuria 04/16/2014   Headache 04/16/2014   Hypothyroidism 04/16/2014   Increased urinary frequency 04/16/2014   Keratoconjunctivitis sicca, 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 04/16/2014   Osteopenia, 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   Other seborrheic keratosis 04/16/2014   Other synovitis and tenosynovitis 04/16/2014   Presbycusis 04/16/2014   Primary localized osteoarthrosis, pelvic region and thigh 04/16/2014   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   Strabismus 04/16/2014  Urethral stenosis 04/16/2014   Urinary tract infection 04/16/2014   Allergic rhinitis 04/16/2014   Degenerative arthritis of hip 02/19/2012    PCP: Verdell Carmine.,  MD  REFERRING PROVIDER: Mcarthur Rossetti, MD  REFERRING DIAG: [phone] (ICD-10-CM) - Chronic pain of left knee  THERAPY DIAG:  Unsteadiness on feet  Difficulty in walking, not elsewhere classified  Chronic pain of left  knee  Muscle weakness (generalized)  Rationale for Evaluation and Treatment Rehabilitation  ONSET DATE:  fell last September fracturing R knee   SUBJECTIVE:   SUBJECTIVE STATEMENT:  Alison Chandler reports she is doing well.  Knee is feeling good today.     PERTINENT HISTORY: R tibial fracture, R TKA, L knee OA/chronic pain, neuropathy, essential tremor, HTN, parathyroid dysfunction  PAIN:  Are you having pain? Yes: NPRS scale: 1/10 Pain location: L knee Pain description: sore  PRECAUTIONS: Fall  WEIGHT BEARING RESTRICTIONS No  FALLS:  Has patient fallen in last 6 months? No but feels unsteady  LIVING ENVIRONMENT: Lives with: lives with their spouse Lives in: House/apartment Stairs: Yes: Internal: 12 steps; on right going up and on left going up and External: 1 steps; none Has following equipment at home: Single point cane  OCCUPATION: retired  PLOF: Independent  PATIENT GOALS improve balance, be able to go shopping again and feel confident with balance   OBJECTIVE:   DIAGNOSTIC FINDINGS: L knee 11/06/2020 2 views of the left knee show tricompartment arthritic changes involving  mainly the lateral compartment and the patellofemoral joint.  There are  large osteophytes in all 3 compartments and significant joint space  narrowing.  PATIENT SURVEYS:  ABC scale 800/1600 = 50% = moderate level of physical functioning.   COGNITION:  Overall cognitive status: Within functional limits for tasks assessed     SENSATION: Numbness/tingling in toes both feet, diminished sensation 25% over L lateral aspect lower leg  EDEMA:  NA  MUSCLE LENGTH: NT  POSTURE: rounded shoulders, forward head, decreased lumbar lordosis, increased thoracic kyphosis, and downward looking gaze  PALPATION: NA  LOWER EXTREMITY ROM:  Active ROM Right eval Left eval  Knee flexion    Knee extension 0 0  Ankle dorsiflexion    Ankle plantarflexion     (Blank rows = not tested)  LOWER  EXTREMITY MMT:  MMT Right eval Left eval  Hip flexion 4+ 4  Hip extension 4 4  Hip abduction 5 5  Hip adduction 5 5  Knee flexion 4+ 4+  Knee extension 5 5  Ankle dorsiflexion 5 5  Ankle plantarflexion     (Blank rows = not tested)  LOWER EXTREMITY SPECIAL TESTS:  NA  FUNCTIONAL TESTS:  5 times sit to stand: 19 seconds with bil UE assist; 06/11/22 - 16 seconds bil UE support 2 minute walk test: 226 ft. Merrilee Jansky Balance Scale: 35/56 Dynamic Gait Index: 10/24  GAIT: Distance walked: 225' Assistive device utilized: Single point cane Level of assistance: Modified independence Comments: visually slow gait speed, genu valgum (L>R), L ankle pronation, decreased w/s to LLE, decreased heel strike bil.    VESTIBULAR ASSESSMENT (06/25/22):  GENERAL OBSERVATION: wearing glasses with prisms, no apparent distress.    SYMPTOM BEHAVIOR:  Subjective history: unsteady, difficulty with head turns, and looking up with walking, feels like she is going to falldiplopia  Non-Vestibular symptoms:   Type of dizziness: Unsteady with head/body turns  Frequency: daily  Aggravating factors: Induced by position change: turning head and looking up  Relieving factors: head  stationary  Progression of symptoms: unchanged  OCULOMOTOR EXAM:  Ocular Alignment: normal  Ocular ROM:  decreased with looking up, decreased peripheral vision.   Spontaneous Nystagmus: absent  Gaze-Induced Nystagmus: age appropriate nystagmus at end range  Smooth Pursuits: saccades  Saccades: hypometric/undershoots  Convergence/Divergence: 10 cm   VESTIBULAR - OCULAR REFLEX:   Slow VOR: Positive Right  VOR Cancellation: Corrective Saccades  Head-Impulse Test: HIT Right: negative HIT Left: negative  Dynamic Visual Acuity:  NT today   POSITIONAL TESTING: NT    MOTION SENSITIVITY:  Motion Sensitivity Quotient Intensity: 0 = none, 1 = Lightheaded, 2 = Mild, 3 = Moderate, 4 = Severe, 5 = Vomiting  Intensity  1. Sitting to  supine 0  2. Supine to L side 0  3. Supine to R side 0  4. Supine to sitting 0  5. L Hallpike-Dix   6. Up from L    7. R Hallpike-Dix   8. Up from R    9. Sitting, head tipped to L knee 0  10. Head up from L knee 0  11. Sitting, head tipped to R knee 0  12. Head up from R knee 0 - but cervical extension gives mild symptoms  13. Sitting head turns x5 0  14.Sitting head nods x5 0  15. In stance, 180 turn to L    16. In stance, 180 turn to R          TODAY'S TREATMENT: 06/29/2022  Neuromuscular Reeducation: to improve balance and stability. SBA for safety throughout.  Nustep L6 x 6 min  x1 Viewing Horizontal: Position: standing, Reps: 3 x 15, and Comment: cues needed and x1 Viewing Vertical:  Position: standing, Reps: 2 x 15, and Comment: cues needed In corner for safety:  On airex: ankle sways x 10, perturbations all directions 3 x 30 sec; eyes closed - immediate loss of balance On level surface Eyes open feet together x 30 sec  Eyes open feet together with head nods x 10 Eyes open feet together with head turns x 10 Eyes closed feet together x 30 sec Eyes closed feet together with head nods x 10 Eyes closed feet together with head turns x 10  Toe touches on Bosu- 2 x 10 bil CGA Step and reach forward - cues to extend head 2 x 10 bil  CGA     06/25/2022 Therapeutic Exercise: Nustep L6 x 6 min  Reevaluation -  to assess vestibular system. See Vestibular assessment above.  Neuromuscular Reeducation: to improve balance and stability. SBA for safety throughout.  Gaze Adaptation:  x1 Viewing Horizontal: Position: seated, Reps: 3 x 15, and Comment: cues needed  and x1 Viewing Vertical:  Position: seated, Reps: 2 x 15, and Comment: cues needed   06/22/22 Therapeutic Exercise: to improve strength and mobility.  Demo, verbal and tactile cues throughout for technique. Nustep L6x59mn Star excursion 3x each LE 1/2 circle pattern  Standing chops with light green ball 2 x  10 Standing rows 15# 2x10 Step ups 6'- up with R down with R 2x10 Knee extension 10# x 15 BLE Knee flexion 25# x 15 BLE   06/18/22 Therapeutic Exercise: to improve strength and mobility.  Demo, verbal and tactile cues throughout for technique. Nustep L6x627m Gait with head turns 3x 170 ft - intially w/o SPC but 2nd and 3rd trial with cane Star excursion 1/2 circle pattern 3x bil Standing lat pulls 15# x 20 Standing shoulder extension 15# x 12 Step ups 6' x  10 bil Seated LAQ x 10 2# Seated hip up and over 2# x 10 bil   PATIENT EDUCATION:  Education details: review and progression HEP 05/21/22, 05/28/22, 10/2, 10/6, 10/26 Person educated: Patient Education method: Consulting civil engineer, Demonstration, Verbal cues, and Handouts Education comprehension: verbalized understanding and returned demonstration   HOME EXERCISE PROGRAM: Access Code: [email or web address] URL: [email or web address] Date: 06/25/2022 Prepared by: Glenetta Hew  Exercises Added  - Seated Gaze Stabilization with Head Rotation  - 3 x daily - 7 x weekly - 3 sets - 15 reps - Seated Gaze Stabilization with Head Nod  - 3 x daily - 7 x weekly - 3 sets - 15 reps  ASSESSMENT:  CLINICAL IMPRESSION: Focus of today's skilled intervention was review of VOR exercises and progression of balance exercises, incorporating more head movement.  Noted corrective saccades still with horizontal head turns, and difficulty extending neck and keeping eyes on target.  With balance exercises noted very challenged with head extension, movements much smaller.  To continue VOR exercises in seated position at home.  Alison Chandler continues to demonstrate potential for improvement and would benefit from continued skilled therapy to address impairments.       OBJECTIVE IMPAIRMENTS Abnormal gait, decreased balance, decreased endurance, decreased mobility, difficulty walking, decreased strength, decreased safety awareness, improper body mechanics,  postural dysfunction, and pain.   ACTIVITY LIMITATIONS carrying, lifting, bending, standing, squatting, stairs, transfers, and locomotion level  PARTICIPATION LIMITATIONS: cleaning, shopping, and community activity  PERSONAL FACTORS Age, Fitness, and 3+ comorbidities: L knee OA, R THA, HTN, essential tremor, neuropathy, parathyroid disfunction, obesity  are also affecting patient's functional outcome.   REHAB POTENTIAL: Good  CLINICAL DECISION MAKING: Evolving/moderate complexity  EVALUATION COMPLEXITY: Moderate   GOALS: Goals reviewed with patient? Yes  SHORT TERM GOALS: Target date: 05/28/2022   Patient will be independent with initial HEP. Baseline:  Goal status: MET 05/21/2022- some clarification needed.  05/28/22- good compliance  2.  Patient will demonstrate decreased fall risk by scoring < 25 sec on TUG. Baseline: NT Goal status: MET  06/01/2022: 14.3 seconds average   3.  Patient will be educated on strategies to decrease risk of falls.  Baseline: needs review Goal status: MET  05/28/22- educated on fall risks and safety   LONG TERM GOALS: Target date: 07/09/2022   Patient will be independent with advanced/ongoing HEP to improve outcomes and carryover.  Baseline:  Goal status: IN PROGRESS  2.  Patient will be able to ambulate 600' with LRAD with good safety to access community.  Baseline: fatigues after 226 ft, SPC needed Goal status: IN PROGRESS  06/15/2022 525' without AD, dyspnea on exertion   3.  Patient will be able to step up/down curb safely with LRAD for safety with community ambulation.  Baseline: difficulty Goal status: IN PROGRESS   4.  Patient will demonstrate improved functional LE strength as demonstrated by 5x STS <15 seconds. Baseline: 19 seconds with UE assist Goal status: IN PROGRESS 06/11/22- 16 secs with UE assist  5.  Patient will demonstrate at least 19/24 on DGI to improve gait stability and reduce risk for falls. Baseline: 10/24 Goal  status: IN PROGRESS 06/15/22- 13/24  6.  Patient will score 45/56 on Berg Balance test to demonstrate lower risk of falls.   Baseline: 35/56 Goal status: IN PROGRESS 06/15/22- 44/56  7.  Patient will report 10% improvement  on ABC scale to demonstrate improved functional ability. Baseline: 800/1600 = 50% confidence Goal status: IN PROGRESS  PLAN: PT FREQUENCY: 1-2x/week  PT DURATION: 8 weeks  PLANNED INTERVENTIONS: Therapeutic exercises, Therapeutic activity, Neuromuscular re-education, Balance training, Gait training, Patient/Family education, Self Care, Joint mobilization, Stair training, Orthotic/Fit training, Dry Needling, Cryotherapy, Moist heat, Ultrasound, Manual therapy, and Re-evaluation  PLAN FOR NEXT SESSION: continue LE strengthening, gait and balance.   Added VOR exercises, work on movements with head extension (reaching and looking up).   Rennie Natter, PT, DPT  06/29/2022, 2:00 PM

## 2022-07-02 ENCOUNTER — Encounter: Payer: Self-pay | Admitting: Physical Therapy

## 2022-07-02 ENCOUNTER — Ambulatory Visit: Payer: Medicare Other | Attending: Orthopaedic Surgery | Admitting: Physical Therapy

## 2022-07-02 DIAGNOSIS — R262 Difficulty in walking, not elsewhere classified: Secondary | ICD-10-CM | POA: Insufficient documentation

## 2022-07-02 DIAGNOSIS — G8929 Other chronic pain: Secondary | ICD-10-CM | POA: Diagnosis present

## 2022-07-02 DIAGNOSIS — R2681 Unsteadiness on feet: Secondary | ICD-10-CM | POA: Diagnosis not present

## 2022-07-02 DIAGNOSIS — M25562 Pain in left knee: Secondary | ICD-10-CM | POA: Diagnosis present

## 2022-07-02 DIAGNOSIS — M6281 Muscle weakness (generalized): Secondary | ICD-10-CM | POA: Insufficient documentation

## 2022-07-02 NOTE — Therapy (Signed)
OUTPATIENT PHYSICAL THERAPY TREATMENT NOTE   Patient Name: Alison Chandler MRN: [id number] DOB:1940-12-10, 81 y.o., female Today's Date: 07/02/2022   PT End of Session - 07/02/22 1319     Visit Number 15    Date for PT Re-Evaluation 07/09/22    Authorization Type Medicare + [employer]    PT Start Time 1319    PT Stop Time 1404    PT Time Calculation (min) 45 min    Activity Tolerance Patient tolerated treatment well    Behavior During Therapy WFL for tasks assessed/performed                  Past Medical History:  Diagnosis Date   Arthritis    Hearing loss    History of kidney stones    Hyperlipidemia    Hypertension    Neuromuscular disorder ([place])    neuropathy legs no medicine   Parathyroid dysfunction ([place])    one Quit working was removed   Tremor, essential    Past Surgical History:  Procedure Laterality Date   colonscopy  01/27/2012   JOINT REPLACEMENT Right 2013   THR   KIDNEY STONE SURGERY     ORIF TIBIA PLATEAU Right 07/15/2021   Procedure: OPEN REDUCTION INTERNAL FIXATION (ORIF) RIGHT TIBIAL PLATEAU;  Surgeon: Mcarthur Rossetti, MD;  Location: [facility];  Service: Orthopedics;  Laterality: Right;   PARATHYROIDECTOMY N/A 05/10/2019   Procedure: PARATHYROIDECTOMY;  Surgeon: Fredirick Maudlin, MD;  Location: [facility];  Service: General;  Laterality: N/A;   TOTAL HIP ARTHROPLASTY  02/19/2012   Procedure: TOTAL HIP ARTHROPLASTY ANTERIOR APPROACH;  Surgeon: Mcarthur Rossetti, MD;  Location: [facility];  Service: Orthopedics;  Laterality: Right;   Patient Active Problem List   Diagnosis Date Noted   Fracture of right tibial plateau 07/15/2021   Closed fracture of right tibial plateau, initial encounter 07/15/2021   Unilateral primary osteoarthritis, left knee 12/19/2019   Benign essential hypertension 10/30/2019   Hypercalcemia 10/30/2019   Acute medial meniscal tear, right, initial encounter 08/16/2019   Iatrogenic hypocalcemia 06/05/2019   Primary  hyperparathyroidism ([place])    Nuclear sclerotic cataract of both eyes 10/05/2018   RPE mottling of macula 10/05/2018   Flat foot 06/20/2018   Tibialis posterior tendonitis, left 06/20/2018   Acute pain of right shoulder 05/04/2018   Chronic pain of left knee 12/22/2017   Subjective memory complaints 10/08/2017   It band syndrome, left 08/11/2017   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[place]) 03/23/2017   Meningioma ([place]) 03/23/2017   Idiopathic peripheral neuropathy 03/11/2017   Imbalance 03/11/2017   Paresthesias 12/06/2016   Tremor 12/06/2016   Age-related osteoporosis without current pathological fracture 12/03/2016   Nephrolithiasis 12/03/2016   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/21/2016   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   Hypertropia of left eye 09/05/2014   Intermittent exotropia of left eye 09/05/2014   Midline cystocele 05/10/2014   Stress bladder incontinence, female 05/10/2014   Benign neoplasm of colon 04/16/2014   Calculus of ureter 04/16/2014   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   Esophageal reflux 04/16/2014   Gross hematuria 04/16/2014   Headache 04/16/2014   Hypothyroidism 04/16/2014   Increased urinary frequency 04/16/2014   Keratoconjunctivitis sicca, 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 04/16/2014   Osteopenia, 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   Other seborrheic keratosis 04/16/2014   Other synovitis and tenosynovitis 04/16/2014   Presbycusis 04/16/2014   Primary localized osteoarthrosis, pelvic region and thigh 04/16/2014   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   Strabismus 04/16/2014   Urethral stenosis  04/16/2014   Urinary tract infection 04/16/2014   Allergic rhinitis 04/16/2014   Degenerative arthritis of hip 02/19/2012    PCP: Verdell Carmine.,  MD  REFERRING PROVIDER: Mcarthur Rossetti, MD  REFERRING DIAG: [phone] (ICD-10-CM) - Chronic pain of left knee  THERAPY DIAG:  Unsteadiness on feet  Difficulty in walking, not elsewhere classified  Chronic pain of left  knee  Muscle weakness (generalized)  Rationale for Evaluation and Treatment Rehabilitation  ONSET DATE:  fell last September fracturing R knee   SUBJECTIVE:   SUBJECTIVE STATEMENT:  CELENIA HRUSKA reports she feels unsteady today, keeping her cane close.  Woke up feeling off-balance , but better now.   Normally tries to stand up slowly since she does take a blood pressure med, also reports her head is stopped up, which seems to affect it as well.   PERTINENT HISTORY: R tibial fracture, R TKA, L knee OA/chronic pain, neuropathy, essential tremor, HTN, parathyroid dysfunction  PAIN:  Are you having pain? Yes: NPRS scale: 2/10 Pain location: L knee Pain description: sore  PRECAUTIONS: Fall  WEIGHT BEARING RESTRICTIONS No  FALLS:  Has patient fallen in last 6 months? No but feels unsteady  LIVING ENVIRONMENT: Lives with: lives with their spouse Lives in: House/apartment Stairs: Yes: Internal: 12 steps; on right going up and on left going up and External: 1 steps; none Has following equipment at home: Single point cane  OCCUPATION: retired  PLOF: Independent  PATIENT GOALS improve balance, be able to go shopping again and feel confident with balance   OBJECTIVE:   DIAGNOSTIC FINDINGS: L knee 11/06/2020 2 views of the left knee show tricompartment arthritic changes involving  mainly the lateral compartment and the patellofemoral joint.  There are  large osteophytes in all 3 compartments and significant joint space  narrowing.  PATIENT SURVEYS:  ABC scale 800/1600 = 50% = moderate level of physical functioning.   COGNITION:  Overall cognitive status: Within functional limits for tasks assessed     SENSATION: Numbness/tingling in toes both feet, diminished sensation 25% over L lateral aspect lower leg  EDEMA:  NA  MUSCLE LENGTH: NT  POSTURE: rounded shoulders, forward head, decreased lumbar lordosis, increased thoracic kyphosis, and downward looking  gaze  PALPATION: NA  LOWER EXTREMITY ROM:  Active ROM Right eval Left eval  Knee flexion    Knee extension 0 0  Ankle dorsiflexion    Ankle plantarflexion     (Blank rows = not tested)  LOWER EXTREMITY MMT:  MMT Right eval Left eval  Hip flexion 4+ 4  Hip extension 4 4  Hip abduction 5 5  Hip adduction 5 5  Knee flexion 4+ 4+  Knee extension 5 5  Ankle dorsiflexion 5 5  Ankle plantarflexion     (Blank rows = not tested)  LOWER EXTREMITY SPECIAL TESTS:  NA  FUNCTIONAL TESTS:  5 times sit to stand: 19 seconds with bil UE assist; 06/11/22 - 16 seconds bil UE support 2 minute walk test: 226 ft. Merrilee Jansky Balance Scale: 35/56 Dynamic Gait Index: 10/24  GAIT: Distance walked: 225' Assistive device utilized: Single point cane Level of assistance: Modified independence Comments: visually slow gait speed, genu valgum (L>R), L ankle pronation, decreased w/s to LLE, decreased heel strike bil.    VESTIBULAR ASSESSMENT (06/25/22):  GENERAL OBSERVATION: wearing glasses with prisms, no apparent distress.    SYMPTOM BEHAVIOR:  Subjective history: unsteady, difficulty with head turns, and looking up with walking, feels like she is going  to falldiplopia  Non-Vestibular symptoms:   Type of dizziness: Unsteady with head/body turns  Frequency: daily  Aggravating factors: Induced by position change: turning head and looking up  Relieving factors: head stationary  Progression of symptoms: unchanged  OCULOMOTOR EXAM:  Ocular Alignment: normal  Ocular ROM:  decreased with looking up, decreased peripheral vision.   Spontaneous Nystagmus: absent  Gaze-Induced Nystagmus: age appropriate nystagmus at end range  Smooth Pursuits: saccades  Saccades: hypometric/undershoots  Convergence/Divergence: 10 cm   VESTIBULAR - OCULAR REFLEX:   Slow VOR: Positive Right  VOR Cancellation: Corrective Saccades  Head-Impulse Test: HIT Right: negative HIT Left: negative  Dynamic Visual Acuity:   NT today   POSITIONAL TESTING: NT    MOTION SENSITIVITY:  Motion Sensitivity Quotient Intensity: 0 = none, 1 = Lightheaded, 2 = Mild, 3 = Moderate, 4 = Severe, 5 = Vomiting  Intensity  1. Sitting to supine 0  2. Supine to L side 0  3. Supine to R side 0  4. Supine to sitting 0  5. L Hallpike-Dix   6. Up from L    7. R Hallpike-Dix   8. Up from R    9. Sitting, head tipped to L knee 0  10. Head up from L knee 0  11. Sitting, head tipped to R knee 0  12. Head up from R knee 0 - but cervical extension gives mild symptoms  13. Sitting head turns x5 0  14.Sitting head nods x5 0  15. In stance, 180 turn to L    16. In stance, 180 turn to R          TODAY'S TREATMENT: 07/02/2022 Neuromuscular Reeducation: to improve balance and stability. SBA/CGA for safety throughout.  Nustep L6 x 6 min  Step and reach 2 x 10 bil to target Stand step and reach and return 2 x 10 - with target on wall 4' in front (needed 3 steps forward and back)  Head nods x 10, turns x 10 with one foot on step stool Stepping over obstacles - starting with ankle weights laid flat 2' apart, progressing to 2 ankle weights stacked, then hand weights as obstacle, CGA for safety.   06/29/2022  Neuromuscular Reeducation: to improve balance and stability. SBA for safety throughout.  Nustep L6 x 6 min  x1 Viewing Horizontal: Position: standing, Reps: 3 x 15, and Comment: cues needed and x1 Viewing Vertical:  Position: standing, Reps: 2 x 15, and Comment: cues needed In corner for safety:  On airex: ankle sways x 10, perturbations all directions 3 x 30 sec; eyes closed - immediate loss of balance On level surface Eyes open feet together x 30 sec  Eyes open feet together with head nods x 10 Eyes open feet together with head turns x 10 Eyes closed feet together x 30 sec Eyes closed feet together with head nods x 10 Eyes closed feet together with head turns x 10  Toe touches on Bosu- 2 x 10 bil CGA Step and reach  forward - cues to extend head 2 x 10 bil  CGA     06/25/2022 Therapeutic Exercise: Nustep L6 x 6 min  Reevaluation -  to assess vestibular system. See Vestibular assessment above.  Neuromuscular Reeducation: to improve balance and stability. SBA for safety throughout.  Gaze Adaptation:  x1 Viewing Horizontal: Position: seated, Reps: 3 x 15, and Comment: cues needed  and x1 Viewing Vertical:  Position: seated, Reps: 2 x 15, and Comment: cues needed  06/22/22 Therapeutic Exercise: to improve strength and mobility.  Demo, verbal and tactile cues throughout for technique. Nustep L6x65mn Star excursion 3x each LE 1/2 circle pattern  Standing chops with light green ball 2 x 10 Standing rows 15# 2x10 Step ups 6'- up with R down with R 2x10 Knee extension 10# x 15 BLE Knee flexion 25# x 15 BLE   06/18/22 Therapeutic Exercise: to improve strength and mobility.  Demo, verbal and tactile cues throughout for technique. Nustep L6x683m Gait with head turns 3x 170 ft - intially w/o SPC but 2nd and 3rd trial with cane Star excursion 1/2 circle pattern 3x bil Standing lat pulls 15# x 20 Standing shoulder extension 15# x 12 Step ups 6' x 10 bil Seated LAQ x 10 2# Seated hip up and over 2# x 10 bil   PATIENT EDUCATION:  Education details: review and progression HEP 05/21/22, 05/28/22, 10/2, 10/6, 10/26.   Person educated: Patient Education method: Explanation, Demonstration, Verbal cues, and Handouts Education comprehension: verbalized understanding and returned demonstration   HOME EXERCISE PROGRAM: Access Code: [email or web address]: [email or web address] Date: 06/25/2022 Prepared by: ElGlenetta HewExercises Added  - Seated Gaze Stabilization with Head Rotation  - 3 x daily - 7 x weekly - 3 sets - 15 reps - Seated Gaze Stabilization with Head Nod  - 3 x daily - 7 x weekly - 3 sets - 15 reps  ASSESSMENT:  CLINICAL IMPRESSION: Focus of today's skilled intervention  progressing dynamic standing activities incorporating reaching, head extension turning, retro stepping, transitions and then stepping over obstacles.  CGA throughout, she was most challenged today with standing with L foot on stool and head turns, unable to isolate turning with just head resulting in more balance challenges, also reporting increased knee pain in this position.  With stepping over obstacles had good clearance, only occasionally catching weight with foot, but always leading with R foot.  KeAellaorried about being reliant on cane, but discussed ways to make cane feel more acceptable.  KeMarianna Paymentontinues to demonstrate potential for improvement and would benefit from continued skilled therapy to address impairments.       OBJECTIVE IMPAIRMENTS Abnormal gait, decreased balance, decreased endurance, decreased mobility, difficulty walking, decreased strength, decreased safety awareness, improper body mechanics, postural dysfunction, and pain.   ACTIVITY LIMITATIONS carrying, lifting, bending, standing, squatting, stairs, transfers, and locomotion level  PARTICIPATION LIMITATIONS: cleaning, shopping, and community activity  PERSONAL FACTORS Age, Fitness, and 3+ comorbidities: L knee OA, R THA, HTN, essential tremor, neuropathy, parathyroid disfunction, obesity  are also affecting patient's functional outcome.   REHAB POTENTIAL: Good  CLINICAL DECISION MAKING: Evolving/moderate complexity  EVALUATION COMPLEXITY: Moderate   GOALS: Goals reviewed with patient? Yes  SHORT TERM GOALS: Target date: 05/28/2022   Patient will be independent with initial HEP. Baseline:  Goal status: MET 05/21/2022- some clarification needed.  05/28/22- good compliance  2.  Patient will demonstrate decreased fall risk by scoring < 25 sec on TUG. Baseline: NT Goal status: MET  06/01/2022: 14.3 seconds average   3.  Patient will be educated on strategies to decrease risk of falls.  Baseline: needs  review Goal status: MET  05/28/22- educated on fall risks and safety   LONG TERM GOALS: Target date: 07/09/2022   Patient will be independent with advanced/ongoing HEP to improve outcomes and carryover.  Baseline:  Goal status: IN PROGRESS  2.  Patient will be able to ambulate 600' with LRAD with good safety to access community.  Baseline: fatigues after 226 ft, SPC needed Goal status: IN PROGRESS  06/15/2022 525' without AD, dyspnea on exertion   3.  Patient will be able to step up/down curb safely with LRAD for safety with community ambulation.  Baseline: difficulty Goal status: IN PROGRESS   4.  Patient will demonstrate improved functional LE strength as demonstrated by 5x STS <15 seconds. Baseline: 19 seconds with UE assist Goal status: IN PROGRESS 06/11/22- 16 secs with UE assist  5.  Patient will demonstrate at least 19/24 on DGI to improve gait stability and reduce risk for falls. Baseline: 10/24 Goal status: IN PROGRESS 06/15/22- 13/24  6.  Patient will score 45/56 on Berg Balance test to demonstrate lower risk of falls.   Baseline: 35/56 Goal status: IN PROGRESS 06/15/22- 44/56  7.  Patient will report 10% improvement  on ABC scale to demonstrate improved functional ability. Baseline: 800/1600 = 50% confidence Goal status: IN PROGRESS    PLAN: PT FREQUENCY: 1-2x/week  PT DURATION: 8 weeks  PLANNED INTERVENTIONS: Therapeutic exercises, Therapeutic activity, Neuromuscular re-education, Balance training, Gait training, Patient/Family education, Self Care, Joint mobilization, Stair training, Orthotic/Fit training, Dry Needling, Cryotherapy, Moist heat, Ultrasound, Manual therapy, and Re-evaluation  PLAN FOR NEXT SESSION: continue LE strengthening, gait and balance.   Work on movements with head extension (reaching and looking up).   Try walking poles.   Rennie Natter, PT, DPT  07/02/2022, 2:13 PM

## 2022-07-06 ENCOUNTER — Ambulatory Visit: Payer: Medicare Other

## 2022-07-06 DIAGNOSIS — G8929 Other chronic pain: Secondary | ICD-10-CM

## 2022-07-06 DIAGNOSIS — M6281 Muscle weakness (generalized): Secondary | ICD-10-CM

## 2022-07-06 DIAGNOSIS — R262 Difficulty in walking, not elsewhere classified: Secondary | ICD-10-CM

## 2022-07-06 DIAGNOSIS — R2681 Unsteadiness on feet: Secondary | ICD-10-CM

## 2022-07-06 NOTE — Therapy (Signed)
OUTPATIENT PHYSICAL THERAPY TREATMENT NOTE   Patient Name: Alison Chandler MRN: [id number] DOB:1941-03-25, 81 y.o., female Today's Date: 07/06/2022   PT End of Session - 07/06/22 1444     Visit Number 16    Date for PT Re-Evaluation 07/09/22    Authorization Type Medicare + [employer]    PT Start Time 1517    PT Stop Time 6160    PT Time Calculation (min) 41 min    Activity Tolerance Patient tolerated treatment well    Behavior During Therapy [facility] for tasks assessed/performed                   Past Medical History:  Diagnosis Date   Arthritis    Hearing loss    History of kidney stones    Hyperlipidemia    Hypertension    Neuromuscular disorder ([place])    neuropathy legs no medicine   Parathyroid dysfunction ([place])    one Quit working was removed   Tremor, essential    Past Surgical History:  Procedure Laterality Date   colonscopy  01/27/2012   JOINT REPLACEMENT Right 2013   THR   KIDNEY STONE SURGERY     ORIF TIBIA PLATEAU Right 07/15/2021   Procedure: OPEN REDUCTION INTERNAL FIXATION (ORIF) RIGHT TIBIAL PLATEAU;  Surgeon: Mcarthur Rossetti, MD;  Location: [place];  Service: Orthopedics;  Laterality: Right;   PARATHYROIDECTOMY N/A 05/10/2019   Procedure: PARATHYROIDECTOMY;  Surgeon: Fredirick Maudlin, MD;  Location: [facility];  Service: General;  Laterality: N/A;   TOTAL HIP ARTHROPLASTY  02/19/2012   Procedure: TOTAL HIP ARTHROPLASTY ANTERIOR APPROACH;  Surgeon: Mcarthur Rossetti, MD;  Location: [facility];  Service: Orthopedics;  Laterality: Right;   Patient Active Problem List   Diagnosis Date Noted   Fracture of right tibial plateau 07/15/2021   Closed fracture of right tibial plateau, initial encounter 07/15/2021   Unilateral primary osteoarthritis, left knee 12/19/2019   Benign essential hypertension 10/30/2019   Hypercalcemia 10/30/2019   Acute medial meniscal tear, right, initial encounter 08/16/2019   Iatrogenic hypocalcemia 06/05/2019   Primary  hyperparathyroidism (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[place]) 03/23/2017   Meningioma ([place]) 03/23/2017   Idiopathic peripheral neuropathy 03/11/2017   Imbalance 03/11/2017   Paresthesias 12/06/2016   Tremor 12/06/2016   Age-related osteoporosis without current pathological fracture 12/03/2016   Nephrolithiasis 12/03/2016   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/21/2016   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   Hypertropia of left eye 09/05/2014   Intermittent exotropia of left eye 09/05/2014   Midline cystocele 05/10/2014   Stress bladder incontinence, female 05/10/2014   Benign neoplasm of colon 04/16/2014   Calculus of ureter 04/16/2014   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   Esophageal reflux 04/16/2014   Gross hematuria 04/16/2014   Headache 04/16/2014   Hypothyroidism 04/16/2014   Increased urinary frequency 04/16/2014   Keratoconjunctivitis sicca, 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 04/16/2014   Osteopenia, 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   Other seborrheic keratosis 04/16/2014   Other synovitis and tenosynovitis 04/16/2014   Presbycusis 04/16/2014   Primary localized osteoarthrosis, pelvic region and thigh 04/16/2014   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   Strabismus 04/16/2014   Urethral  stenosis 04/16/2014   Urinary tract infection 04/16/2014   Allergic rhinitis 04/16/2014   Degenerative arthritis of hip 02/19/2012    PCP: Verdell Carmine.,  MD  REFERRING PROVIDER: Mcarthur Rossetti, MD  REFERRING DIAG: [phone] (ICD-10-CM) - Chronic pain of left knee  THERAPY DIAG:  Unsteadiness on feet  Difficulty in walking, not elsewhere classified  Chronic pain of left  knee  Muscle weakness (generalized)  Rationale for Evaluation and Treatment Rehabilitation  ONSET DATE:  fell last September fracturing R knee   SUBJECTIVE:   SUBJECTIVE STATEMENT:  No new reports.  PERTINENT HISTORY: R tibial fracture, R TKA, L knee OA/chronic pain, neuropathy, essential tremor, HTN, parathyroid dysfunction  PAIN:  Are you having pain? Yes: NPRS scale: 2/10 Pain location: L knee Pain description: sore  PRECAUTIONS: Fall  WEIGHT BEARING RESTRICTIONS No  FALLS:  Has patient fallen in last 6 months? No but feels unsteady  LIVING ENVIRONMENT: Lives with: lives with their spouse Lives in: House/apartment Stairs: Yes: Internal: 12 steps; on right going up and on left going up and External: 1 steps; none Has following equipment at home: Single point cane  OCCUPATION: retired  PLOF: Independent  PATIENT GOALS improve balance, be able to go shopping again and feel confident with balance   OBJECTIVE:   DIAGNOSTIC FINDINGS: L knee 11/06/2020 2 views of the left knee show tricompartment arthritic changes involving  mainly the lateral compartment and the patellofemoral joint.  There are  large osteophytes in all 3 compartments and significant joint space  narrowing.  PATIENT SURVEYS:  ABC scale 800/1600 = 50% = moderate level of physical functioning.   COGNITION:  Overall cognitive status: Within functional limits for tasks assessed     SENSATION: Numbness/tingling in toes both feet, diminished sensation 25% over L lateral aspect lower leg  EDEMA:  NA  MUSCLE LENGTH: NT  POSTURE: rounded shoulders, forward head, decreased lumbar lordosis, increased thoracic kyphosis, and downward looking gaze  PALPATION: NA  LOWER EXTREMITY ROM:  Active ROM Right eval Left eval  Knee flexion    Knee extension 0 0  Ankle dorsiflexion    Ankle plantarflexion     (Blank rows = not tested)  LOWER EXTREMITY MMT:  MMT Right eval Left eval  Hip flexion  4+ 4  Hip extension 4 4  Hip abduction 5 5  Hip adduction 5 5  Knee flexion 4+ 4+  Knee extension 5 5  Ankle dorsiflexion 5 5  Ankle plantarflexion     (Blank rows = not tested)  LOWER EXTREMITY SPECIAL TESTS:  NA  FUNCTIONAL TESTS:  5 times sit to stand: 19 seconds with bil UE assist; 06/11/22 - 16 seconds bil UE support 2 minute walk test: 226 ft. Merrilee Jansky Balance Scale: 35/56 Dynamic Gait Index: 10/24  GAIT: Distance walked: 225' Assistive device utilized: Single point cane Level of assistance: Modified independence Comments: visually slow gait speed, genu valgum (L>R), L ankle pronation, decreased w/s to LLE, decreased heel strike bil.    VESTIBULAR ASSESSMENT (06/25/22):  GENERAL OBSERVATION: wearing glasses with prisms, no apparent distress.    SYMPTOM BEHAVIOR:  Subjective history: unsteady, difficulty with head turns, and looking up with walking, feels like she is going to falldiplopia  Non-Vestibular symptoms:   Type of dizziness: Unsteady with head/body turns  Frequency: daily  Aggravating factors: Induced by position change: turning head and looking up  Relieving factors: head stationary  Progression of symptoms: unchanged  OCULOMOTOR EXAM:  Ocular Alignment: normal  Ocular  ROM:  decreased with looking up, decreased peripheral vision.   Spontaneous Nystagmus: absent  Gaze-Induced Nystagmus: age appropriate nystagmus at end range  Smooth Pursuits: saccades  Saccades: hypometric/undershoots  Convergence/Divergence: 10 cm   VESTIBULAR - OCULAR REFLEX:   Slow VOR: Positive Right  VOR Cancellation: Corrective Saccades  Head-Impulse Test: HIT Right: negative HIT Left: negative  Dynamic Visual Acuity:  NT today   POSITIONAL TESTING: NT    MOTION SENSITIVITY:  Motion Sensitivity Quotient Intensity: 0 = none, 1 = Lightheaded, 2 = Mild, 3 = Moderate, 4 = Severe, 5 = Vomiting  Intensity  1. Sitting to supine 0  2. Supine to L side 0  3. Supine to R side 0   4. Supine to sitting 0  5. L Hallpike-Dix   6. Up from L    7. R Hallpike-Dix   8. Up from R    9. Sitting, head tipped to L knee 0  10. Head up from L knee 0  11. Sitting, head tipped to R knee 0  12. Head up from R knee 0 - but cervical extension gives mild symptoms  13. Sitting head turns x5 0  14.Sitting head nods x5 0  15. In stance, 180 turn to L    16. In stance, 180 turn to R          TODAY'S TREATMENT: 07/02/2022 Neuromuscular Reeducation: to improve balance and stability. SBA/CGA for safety throughout.  Bike L2x24mn Curb stepping 4x up and down with SPC outside 5xSTS- 16 sec with UE support Review of current HEP with patient  07/02/2022 Neuromuscular Reeducation: to improve balance and stability. SBA/CGA for safety throughout.  Nustep L6 x 6 min  Step and reach 2 x 10 bil to target Stand step and reach and return 2 x 10 - with target on wall 4' in front (needed 3 steps forward and back)  Head nods x 10, turns x 10 with one foot on step stool Stepping over obstacles - starting with ankle weights laid flat 2' apart, progressing to 2 ankle weights stacked, then hand weights as obstacle, CGA for safety.   06/29/2022  Neuromuscular Reeducation: to improve balance and stability. SBA for safety throughout.  Nustep L6 x 6 min  x1 Viewing Horizontal: Position: standing, Reps: 3 x 15, and Comment: cues needed and x1 Viewing Vertical:  Position: standing, Reps: 2 x 15, and Comment: cues needed In corner for safety:  On airex: ankle sways x 10, perturbations all directions 3 x 30 sec; eyes closed - immediate loss of balance On level surface Eyes open feet together x 30 sec  Eyes open feet together with head nods x 10 Eyes open feet together with head turns x 10 Eyes closed feet together x 30 sec Eyes closed feet together with head nods x 10 Eyes closed feet together with head turns x 10  Toe touches on Bosu- 2 x 10 bil CGA Step and reach forward - cues to extend head 2  x 10 bil  CGA     06/25/2022 Therapeutic Exercise: Nustep L6 x 6 min  Reevaluation -  to assess vestibular system. See Vestibular assessment above.  Neuromuscular Reeducation: to improve balance and stability. SBA for safety throughout.  Gaze Adaptation:  x1 Viewing Horizontal: Position: seated, Reps: 3 x 15, and Comment: cues needed  and x1 Viewing Vertical:  Position: seated, Reps: 2 x 15, and Comment: cues needed   06/22/22 Therapeutic Exercise: to improve strength and mobility.  Demo,  verbal and tactile cues throughout for technique. Nustep L6x77mn Star excursion 3x each LE 1/2 circle pattern  Standing chops with light green ball 2 x 10 Standing rows 15# 2x10 Step ups 6'- up with R down with R 2x10 Knee extension 10# x 15 BLE Knee flexion 25# x 15 BLE   06/18/22 Therapeutic Exercise: to improve strength and mobility.  Demo, verbal and tactile cues throughout for technique. Nustep L6x610m Gait with head turns 3x 170 ft - intially w/o SPC but 2nd and 3rd trial with cane Star excursion 1/2 circle pattern 3x bil Standing lat pulls 15# x 20 Standing shoulder extension 15# x 12 Step ups 6' x 10 bil Seated LAQ x 10 2# Seated hip up and over 2# x 10 bil   PATIENT EDUCATION:  Education details: review and progression HEP 05/21/22, 05/28/22, 10/2, 10/6, 10/26.   Person educated: Patient Education method: Explanation, Demonstration, Verbal cues, and Handouts Education comprehension: verbalized understanding and returned demonstration   HOME EXERCISE PROGRAM: Access Code: [email or web address]: [email or web address] Date: 06/25/2022 Prepared by: ElGlenetta HewExercises Added  - Seated Gaze Stabilization with Head Rotation  - 3 x daily - 7 x weekly - 3 sets - 15 reps - Seated Gaze Stabilization with Head Nod  - 3 x daily - 7 x weekly - 3 sets - 15 reps  ASSESSMENT:  CLINICAL IMPRESSION: Today's session focused on assessing LTGs and reviewing HEP to ensure  understanding for D/C. She was able to demonstrate good, safe navigation of curb with SPC, although having to stop and adjust steps before navigating. The score for 5x STS was the same as last time assessed.  Spent remainder of time reviewing HEP, giving clarification on exercises from HEP. Pt at this time wants to try 30 day hold at end of POC.     OBJECTIVE IMPAIRMENTS Abnormal gait, decreased balance, decreased endurance, decreased mobility, difficulty walking, decreased strength, decreased safety awareness, improper body mechanics, postural dysfunction, and pain.   ACTIVITY LIMITATIONS carrying, lifting, bending, standing, squatting, stairs, transfers, and locomotion level  PARTICIPATION LIMITATIONS: cleaning, shopping, and community activity  PERSONAL FACTORS Age, Fitness, and 3+ comorbidities: L knee OA, R THA, HTN, essential tremor, neuropathy, parathyroid disfunction, obesity  are also affecting patient's functional outcome.   REHAB POTENTIAL: Good  CLINICAL DECISION MAKING: Evolving/moderate complexity  EVALUATION COMPLEXITY: Moderate   GOALS: Goals reviewed with patient? Yes  SHORT TERM GOALS: Target date: 05/28/2022   Patient will be independent with initial HEP. Baseline:  Goal status: MET 05/21/2022- some clarification needed.  05/28/22- good compliance  2.  Patient will demonstrate decreased fall risk by scoring < 25 sec on TUG. Baseline: NT Goal status: MET  06/01/2022: 14.3 seconds average   3.  Patient will be educated on strategies to decrease risk of falls.  Baseline: needs review Goal status: MET  05/28/22- educated on fall risks and safety   LONG TERM GOALS: Target date: 07/09/2022   Patient will be independent with advanced/ongoing HEP to improve outcomes and carryover.  Baseline:  Goal status: IN PROGRESS  2.  Patient will be able to ambulate 600' with LRAD with good safety to access community.  Baseline: fatigues after 226 ft, SPC needed Goal status: IN  PROGRESS  06/15/2022 525' without AD, dyspnea on exertion   3.  Patient will be able to step up/down curb safely with LRAD for safety with community ambulation.  Baseline: difficulty Goal status: MET- 07/06/22  4.  Patient will demonstrate improved functional LE  strength as demonstrated by 5x STS <15 seconds. Baseline: 19 seconds with UE assist Goal status: IN PROGRESS 07/06/22- 16 secs with UE assist  5.  Patient will demonstrate at least 19/24 on DGI to improve gait stability and reduce risk for falls. Baseline: 10/24 Goal status: IN PROGRESS 06/15/22- 13/24  6.  Patient will score 45/56 on Berg Balance test to demonstrate lower risk of falls.   Baseline: 35/56 Goal status: IN PROGRESS 06/15/22- 44/56  7.  Patient will report 10% improvement  on ABC scale to demonstrate improved functional ability. Baseline: 800/1600 = 50% confidence Goal status: IN PROGRESS    PLAN: PT FREQUENCY: 1-2x/week  PT DURATION: 8 weeks  PLANNED INTERVENTIONS: Therapeutic exercises, Therapeutic activity, Neuromuscular re-education, Balance training, Gait training, Patient/Family education, Self Care, Joint mobilization, Stair training, Orthotic/Fit training, Dry Needling, Cryotherapy, Moist heat, Ultrasound, Manual therapy, and Re-evaluation  PLAN FOR NEXT SESSION: 30 day hold; continue LE strengthening, gait and balance.   Work on movements with head extension (reaching and looking up).   Try walking poles.   Artist Pais, PTA 07/06/2022, 2:44 PM

## 2022-07-09 ENCOUNTER — Encounter: Payer: Self-pay | Admitting: Physical Therapy

## 2022-07-09 ENCOUNTER — Ambulatory Visit: Payer: Medicare Other | Admitting: Physical Therapy

## 2022-07-09 DIAGNOSIS — R262 Difficulty in walking, not elsewhere classified: Secondary | ICD-10-CM

## 2022-07-09 DIAGNOSIS — G8929 Other chronic pain: Secondary | ICD-10-CM

## 2022-07-09 DIAGNOSIS — M6281 Muscle weakness (generalized): Secondary | ICD-10-CM

## 2022-07-09 DIAGNOSIS — R2681 Unsteadiness on feet: Secondary | ICD-10-CM | POA: Diagnosis not present

## 2022-07-09 NOTE — Therapy (Addendum)
OUTPATIENT PHYSICAL THERAPY TREATMENT NOTE Progress Note Reporting Period 06/15/2022 to 07/09/2022  See note below for Objective Data and Assessment of Progress/Goals.      Patient Name: Alison Chandler MRN: [id number] DOB:[birth year], 81 y.o., female Today's Date: 07/09/2022   PT End of Session - 07/09/22 1421     Visit Number 17    Date for PT Re-Evaluation 07/09/22    Authorization Type Medicare + [employer]    PT Start Time 1405    PT Stop Time 1446    PT Time Calculation (min) 41 min    Activity Tolerance Patient tolerated treatment well    Behavior During Therapy [facility] for tasks assessed/performed                   Past Medical History:  Diagnosis Date   Arthritis    Hearing loss    History of kidney stones    Hyperlipidemia    Hypertension    Neuromuscular disorder ([place])    neuropathy legs no medicine   Parathyroid dysfunction ([place])    one Quit working was removed   Tremor, essential    Past Surgical History:  Procedure Laterality Date   colonscopy  01/27/2012   JOINT REPLACEMENT Right 2013   THR   KIDNEY STONE SURGERY     ORIF TIBIA PLATEAU Right 07/15/2021   Procedure: OPEN REDUCTION INTERNAL FIXATION (ORIF) RIGHT TIBIAL PLATEAU;  Surgeon: Mcarthur Rossetti, MD;  Location: [place];  Service: Orthopedics;  Laterality: Right;   PARATHYROIDECTOMY N/A 05/10/2019   Procedure: PARATHYROIDECTOMY;  Surgeon: Fredirick Maudlin, MD;  Location: [facility];  Service: General;  Laterality: N/A;   TOTAL HIP ARTHROPLASTY  02/19/2012   Procedure: TOTAL HIP ARTHROPLASTY ANTERIOR APPROACH;  Surgeon: Mcarthur Rossetti, MD;  Location: [facility];  Service: Orthopedics;  Laterality: Right;   Patient Active Problem List   Diagnosis Date Noted   Fracture of right tibial plateau 07/15/2021   Closed fracture of right tibial plateau, initial encounter 07/15/2021   Unilateral primary osteoarthritis, left knee 12/19/2019   Benign essential hypertension 10/30/2019    Hypercalcemia 10/30/2019   Acute medial meniscal tear, right, initial encounter 08/16/2019   Iatrogenic hypocalcemia 06/05/2019   Primary hyperparathyroidism ([place])    Nuclear sclerotic cataract of both eyes 10/05/2018   RPE mottling of macula 10/05/2018   Flat foot 06/20/2018   Tibialis posterior tendonitis, left 06/20/2018   Acute pain of right shoulder 05/04/2018   Chronic pain of left knee 12/22/2017   Subjective memory complaints 10/08/2017   It band syndrome, left 08/11/2017   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/2017   Meningioma ([place]) 03/23/2017   Idiopathic peripheral neuropathy 03/11/2017   Imbalance 03/11/2017   Paresthesias 12/06/2016   Tremor 12/06/2016   Age-related osteoporosis without current pathological fracture 12/03/2016   Nephrolithiasis 12/03/2016   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/21/2016   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   Hypertropia of left eye 09/05/2014   Intermittent exotropia of left eye 09/05/2014   Midline cystocele 05/10/2014   Stress bladder incontinence, female 05/10/2014   Benign neoplasm of colon 04/16/2014   Calculus of ureter 04/16/2014   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   Esophageal reflux 04/16/2014   Gross hematuria 04/16/2014   Headache 04/16/2014   Hypothyroidism 04/16/2014   Increased urinary frequency 04/16/2014   Keratoconjunctivitis sicca, 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 04/16/2014   Osteopenia, 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   Other seborrheic keratosis 04/16/2014   Other synovitis and tenosynovitis 04/16/2014   Presbycusis 04/16/2014  Primary localized osteoarthrosis, pelvic region and thigh 04/16/2014   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   Strabismus 04/16/2014   Urethral stenosis 04/16/2014   Urinary tract infection 04/16/2014   Allergic rhinitis 04/16/2014   Degenerative arthritis of hip 02/19/2012    PCP: Verdell Carmine.,  MD  REFERRING PROVIDER: Mcarthur Rossetti, MD  REFERRING DIAG: [phone] (ICD-10-CM) -  Chronic pain of left knee  THERAPY DIAG:  Unsteadiness on feet  Difficulty in walking, not elsewhere classified  Chronic pain of left knee  Muscle weakness (generalized)  Rationale for Evaluation and Treatment Rehabilitation  ONSET DATE:  fell last September fracturing R knee   SUBJECTIVE:   SUBJECTIVE STATEMENT:  Alison Chandler reports she feels much more confident in her balance overall.  She is planning to have eye surgery in January.  She is not planning to have her knee replaced until April or May in order to give her self recovery time from the eye surgery, since the prisms will have to be redone for her glasses.  Her knee is not bothering her that much.  PERTINENT HISTORY: R tibial fracture, R TKA, L knee OA/chronic pain, neuropathy, essential tremor, HTN, parathyroid dysfunction  PAIN:  Are you having pain? Yes: NPRS scale: 2/10 Pain location: L knee Pain description: sore  PRECAUTIONS: Fall  WEIGHT BEARING RESTRICTIONS No  FALLS:  Has patient fallen in last 6 months? No but feels unsteady  LIVING ENVIRONMENT: Lives with: lives with their spouse Lives in: House/apartment Stairs: Yes: Internal: 12 steps; on right going up and on left going up and External: 1 steps; none Has following equipment at home: Single point cane  OCCUPATION: retired  PLOF: Independent  PATIENT GOALS improve balance, be able to go shopping again and feel confident with balance   OBJECTIVE:   DIAGNOSTIC FINDINGS: L knee 11/06/2020 2 views of the left knee show tricompartment arthritic changes involving  mainly the lateral compartment and the patellofemoral joint.  There are  large osteophytes in all 3 compartments and significant joint space  narrowing.  PATIENT SURVEYS:  ABC scale 800/1600 = 50% = moderate level of physical functioning.   COGNITION:  Overall cognitive status: Within functional limits for tasks assessed     SENSATION: Numbness/tingling in toes both feet,  diminished sensation 25% over L lateral aspect lower leg  EDEMA:  NA  MUSCLE LENGTH: NT  POSTURE: rounded shoulders, forward head, decreased lumbar lordosis, increased thoracic kyphosis, and downward looking gaze  PALPATION: NA  LOWER EXTREMITY ROM:  Active ROM Right eval Left eval  Knee flexion    Knee extension 0 0  Ankle dorsiflexion    Ankle plantarflexion     (Blank rows = not tested)  LOWER EXTREMITY MMT:  MMT Right eval Left eval  Hip flexion 4+ 4  Hip extension 4 4  Hip abduction 5 5  Hip adduction 5 5  Knee flexion 4+ 4+  Knee extension 5 5  Ankle dorsiflexion 5 5  Ankle plantarflexion     (Blank rows = not tested)  LOWER EXTREMITY SPECIAL TESTS:  NA  FUNCTIONAL TESTS:  5 times sit to stand: 19 seconds with bil UE assist; 06/11/22 - 16 seconds bil UE support 2 minute walk test: 226 ft. Merrilee Jansky Balance Scale: 35/56 Dynamic Gait Index: 10/24  GAIT: Distance walked: 225' Assistive device utilized: Single point cane Level of assistance: Modified independence Comments: visually slow gait speed, genu valgum (L>R), L ankle pronation, decreased w/s to LLE, decreased heel strike bil.  VESTIBULAR ASSESSMENT (06/25/22):  GENERAL OBSERVATION: wearing glasses with prisms, no apparent distress.    SYMPTOM BEHAVIOR:  Subjective history: unsteady, difficulty with head turns, and looking up with walking, feels like she is going to falldiplopia  Non-Vestibular symptoms:   Type of dizziness: Unsteady with head/body turns  Frequency: daily  Aggravating factors: Induced by position change: turning head and looking up  Relieving factors: head stationary  Progression of symptoms: unchanged  OCULOMOTOR EXAM:  Ocular Alignment: normal  Ocular ROM:  decreased with looking up, decreased peripheral vision.   Spontaneous Nystagmus: absent  Gaze-Induced Nystagmus: age appropriate nystagmus at end range  Smooth Pursuits: saccades  Saccades:  hypometric/undershoots  Convergence/Divergence: 10 cm   VESTIBULAR - OCULAR REFLEX:   Slow VOR: Positive Right  VOR Cancellation: Corrective Saccades  Head-Impulse Test: HIT Right: negative HIT Left: negative  Dynamic Visual Acuity:  NT today   POSITIONAL TESTING: NT    MOTION SENSITIVITY:  Motion Sensitivity Quotient Intensity: 0 = none, 1 = Lightheaded, 2 = Mild, 3 = Moderate, 4 = Severe, 5 = Vomiting  Intensity  1. Sitting to supine 0  2. Supine to L side 0  3. Supine to R side 0  4. Supine to sitting 0  5. L Hallpike-Dix   6. Up from L    7. R Hallpike-Dix   8. Up from R    9. Sitting, head tipped to L knee 0  10. Head up from L knee 0  11. Sitting, head tipped to R knee 0  12. Head up from R knee 0 - but cervical extension gives mild symptoms  13. Sitting head turns x5 0  14.Sitting head nods x5 0  15. In stance, 180 turn to L    16. In stance, 180 turn to R          TODAY'S TREATMENT: 07/09/2022 Therapeutic Activity:  review of progress and goals, to encourage transition to community based exercise program.  Nustep x 6 min L5 6MWT Berg ABC scale Review of HEP - new copies printed.  Education on maintaining progress and recommendations for gym.   07/06/2022 Neuromuscular Reeducation: to improve balance and stability. SBA/CGA for safety throughout.  Bike L2x73mn Curb stepping 4x up and down with SPC outside 5xSTS- 16 sec with UE support Review of current HEP with patient  07/02/2022 Neuromuscular Reeducation: to improve balance and stability. SBA/CGA for safety throughout.  Nustep L6 x 6 min  Step and reach 2 x 10 bil to target Stand step and reach and return 2 x 10 - with target on wall 4' in front (needed 3 steps forward and back)  Head nods x 10, turns x 10 with one foot on step stool Stepping over obstacles - starting with ankle weights laid flat 2' apart, progressing to 2 ankle weights stacked, then hand weights as obstacle, CGA for safety.    06/29/2022  Neuromuscular Reeducation: to improve balance and stability. SBA for safety throughout.  Nustep L6 x 6 min  x1 Viewing Horizontal: Position: standing, Reps: 3 x 15, and Comment: cues needed and x1 Viewing Vertical:  Position: standing, Reps: 2 x 15, and Comment: cues needed In corner for safety:  On airex: ankle sways x 10, perturbations all directions 3 x 30 sec; eyes closed - immediate loss of balance On level surface Eyes open feet together x 30 sec  Eyes open feet together with head nods x 10 Eyes open feet together with head turns x 10 Eyes closed  feet together x 30 sec Eyes closed feet together with head nods x 10 Eyes closed feet together with head turns x 10  Toe touches on Bosu- 2 x 10 bil CGA Step and reach forward - cues to extend head 2 x 10 bil  CGA    PATIENT EDUCATION:  Education details: review and progression HEP 05/21/22, 05/28/22, 10/2, 10/6, 10/26.   Person educated: Patient Education method: Explanation, Demonstration, Verbal cues, and Handouts Education comprehension: verbalized understanding and returned demonstration   HOME EXERCISE PROGRAM: Access Code: [email or web address] URL: [email or web address] Date: 07/09/2022 Prepared by: [place] with Counter Support  - 1 x daily - 7 x weekly - 2-3 sets - 10 reps - Heel Toe Raises with Counter Support  - 1 x daily - 7 x weekly - 2-3 sets - 10 reps - Standing Hip Abduction with Counter Support  - 1 x daily - 7 x weekly - 2-3 sets - 10 reps - Standing Hip Extension with Counter Support  - 1 x daily - 7 x weekly - 2-3 sets - 10 reps - Mini Squat with Counter Support  - 1 x daily - 7 x weekly - 2-3 sets - 10 reps - Standing Tandem Balance with Counter Support  - 1 x daily - 7 x weekly - 1 sets - 3 reps - 30 sec hold - Semi-Tandem Corner Balance With Eyes Open  - 1 x daily - 7 x weekly - 1 sets - 3 reps - 30 sec  hold - Seated Isometric Knee Extension  - 2 x daily -  7 x weekly - 1 sets - 10 reps - 10 sec  hold - Forward T with Counter Support  - 1 x daily - 7 x weekly - 2 sets - 10 reps - Backward Weight Shift and Opposite Arm Raise with Walker  - 1 x daily - 7 x weekly - 3 sets - 10 reps - Side Stepping with Unilateral Counter Support  - 1 x daily - 7 x weekly - 3 sets - 10 reps - Standing Balance in Corner  - 1 x daily - 7 x weekly - 3 sets - 10 reps - Standing Balance in Corner with Eyes Closed  - 1 x daily - 7 x weekly - 3 sets - 10 reps - Standing Hip Hiking  - 1 x daily - 7 x weekly - 3 sets - 10 reps - Supine Hip Internal and External Rotation  - 1 x daily - 7 x weekly - 2 sets - 10 reps - Supine Lower Trunk Rotation  - 1 x daily - 7 x weekly - 2 sets - 10 reps - Supine Bridge  - 1 x daily - 7 x weekly - 2 sets - 10 reps - Prone Hip Extension  - 1 x daily - 7 x weekly - 2 sets - 10 reps - Prone Alternating Bent Leg Fallout  - 1 x daily - 7 x weekly - 2 sets - 10 reps - Seated March  - 1 x daily - 7 x weekly - 2 sets - 10 reps - Seated Gaze Stabilization with Head Rotation  - 3 x daily - 7 x weekly - 3 sets - 15 reps - Seated Gaze Stabilization with Head Nod  - 3 x daily - 7 x weekly - 3 sets - 15 reps  ASSESSMENT:  CLINICAL IMPRESSION: Alison Chandler has made good progress in PT, reporting significant improvement in  confidence, 78.8% on ABC scale.  Her static balance has improved significantly, 47/56 on BERG, decreasing her risk of falls.  She is still unsteady with gait, recommended continued use of SPC, and after eye surgery using 4WRW for balance and stability.  She has met LTG #1, 2, 3, 6, and 7 and made good progress towards remaining goals.  She still lacks functional LE strength, unable to stand without UE assist, and 5x STS was 16 seconds with UE assist.  Discussed today importance of continuing to exercise and strengthen, and discussed getting membership at the [facility] regional fitness center, since these have her preferred machines and good  supervision, and there are exercise physiologists available that could help her design a program to continue strengthening and for balance.  She is agreeable to 30-day hold today, discussed asking her doctor for referral back to PT if she feels her balance is impaired after her eye surgery.  OBJECTIVE IMPAIRMENTS Abnormal gait, decreased balance, decreased endurance, decreased mobility, difficulty walking, decreased strength, decreased safety awareness, improper body mechanics, postural dysfunction, and pain.   ACTIVITY LIMITATIONS carrying, lifting, bending, standing, squatting, stairs, transfers, and locomotion level  PARTICIPATION LIMITATIONS: cleaning, shopping, and community activity  PERSONAL FACTORS Age, Fitness, and 3+ comorbidities: L knee OA, R THA, HTN, essential tremor, neuropathy, parathyroid disfunction, obesity  are also affecting patient's functional outcome.   REHAB POTENTIAL: Good  CLINICAL DECISION MAKING: Evolving/moderate complexity  EVALUATION COMPLEXITY: Moderate   GOALS: Goals reviewed with patient? Yes  SHORT TERM GOALS: Target date: 05/28/2022   Patient will be independent with initial HEP. Baseline:  Goal status: MET 05/21/2022- some clarification needed.  05/28/22- good compliance  2.  Patient will demonstrate decreased fall risk by scoring < 25 sec on TUG. Baseline: NT Goal status: MET  06/01/2022: 14.3 seconds average   3.  Patient will be educated on strategies to decrease risk of falls.  Baseline: needs review Goal status: MET  05/28/22- educated on fall risks and safety   LONG TERM GOALS: Target date: 07/09/2022   Patient will be independent with advanced/ongoing HEP to improve outcomes and carryover.  Baseline:  Goal status: MET 07/09/2022- reports good compliance, daily performance  2.  Patient will be able to ambulate 600' with LRAD with good safety to access community.  Baseline: fatigues after 226 ft, SPC needed Goal status: MET  06/15/2022  525' without AD, dyspnea on exertion 07/09/2022- 625' with SPC, some dyspnea   3.  Patient will be able to step up/down curb safely with LRAD for safety with community ambulation.  Baseline: difficulty Goal status: MET- 07/06/22  4.  Patient will demonstrate improved functional LE strength as demonstrated by 5x STS <15 seconds. Baseline: 19 seconds with UE assist Goal status: IN PROGRESS 07/06/22- 16 secs with UE assist  5.  Patient will demonstrate at least 19/24 on DGI to improve gait stability and reduce risk for falls. Baseline: 10/24 Goal status: IN PROGRESS 06/15/22- 13/24  6.  Patient will score 45/56 on Berg Balance test to demonstrate lower risk of falls.   Baseline: 35/56 Goal status: MET 06/15/22- 44/56 07/09/2022- 47/56  7.  Patient will report 10% improvement  on ABC scale to demonstrate improved functional ability. Baseline: 800/1600 = 50% confidence Goal status: MET  78.8%%    PLAN: PT FREQUENCY: 1-2x/week  PT DURATION: 8 weeks  PLANNED INTERVENTIONS: Therapeutic exercises, Therapeutic activity, Neuromuscular re-education, Balance training, Gait training, Patient/Family education, Self Care, Joint mobilization, Stair training, Orthotic/Fit training, Dry Needling,  Cryotherapy, Moist heat, Ultrasound, Manual therapy, and Re-evaluation  PLAN FOR NEXT SESSION: 30 day hold.   Rennie Natter, PT, DPT  07/09/2022, 5:38 PM   PHYSICAL THERAPY DISCHARGE SUMMARY  Visits from Start of Care: 17  Current functional level related to goals / functional outcomes: improved confidence, 78.8% on ABC scale.  Her static balance has improved significantly, 47/56 on BERG, decreasing her risk of falls.   She has met LTG #1, 2, 3, 6, and 7 and made good progress towards remaining goals.  Planning on joining gym at [employer] regional fitness center.    Remaining deficits: She is still unsteady with gait, recommended continued use of SPC, and after eye surgery using 4WRW for balance  and stability.  She still lacks functional LE strength, unable to stand without UE assist, and 5x STS was 16 seconds with UE assist.    Education / Equipment: HEP  Plan: Patient agrees to discharge.  Patient is being discharged due to meeting the stated rehab goals.  She was placed on 30 day hold on 07/09/2022 and has not returned to therapy during this time frame.    Rennie Natter, PT, DPT 9:17 AM 08/12/2022

## 2022-09-23 ENCOUNTER — Ambulatory Visit: Payer: Medicare Other | Admitting: Orthopaedic Surgery

## 2023-02-05 IMAGING — RF DG KNEE 1-2V*R*
1 series · 2 of 2 positions shown · non-contrast
Comparison: 07/09/2021

CLINICAL DATA: Right tibial plateau fracture

EXAM:
RIGHT KNEE - 1-2 VIEW

[Series 1: run · 2 of 2 slices shown]
[im 1/2]
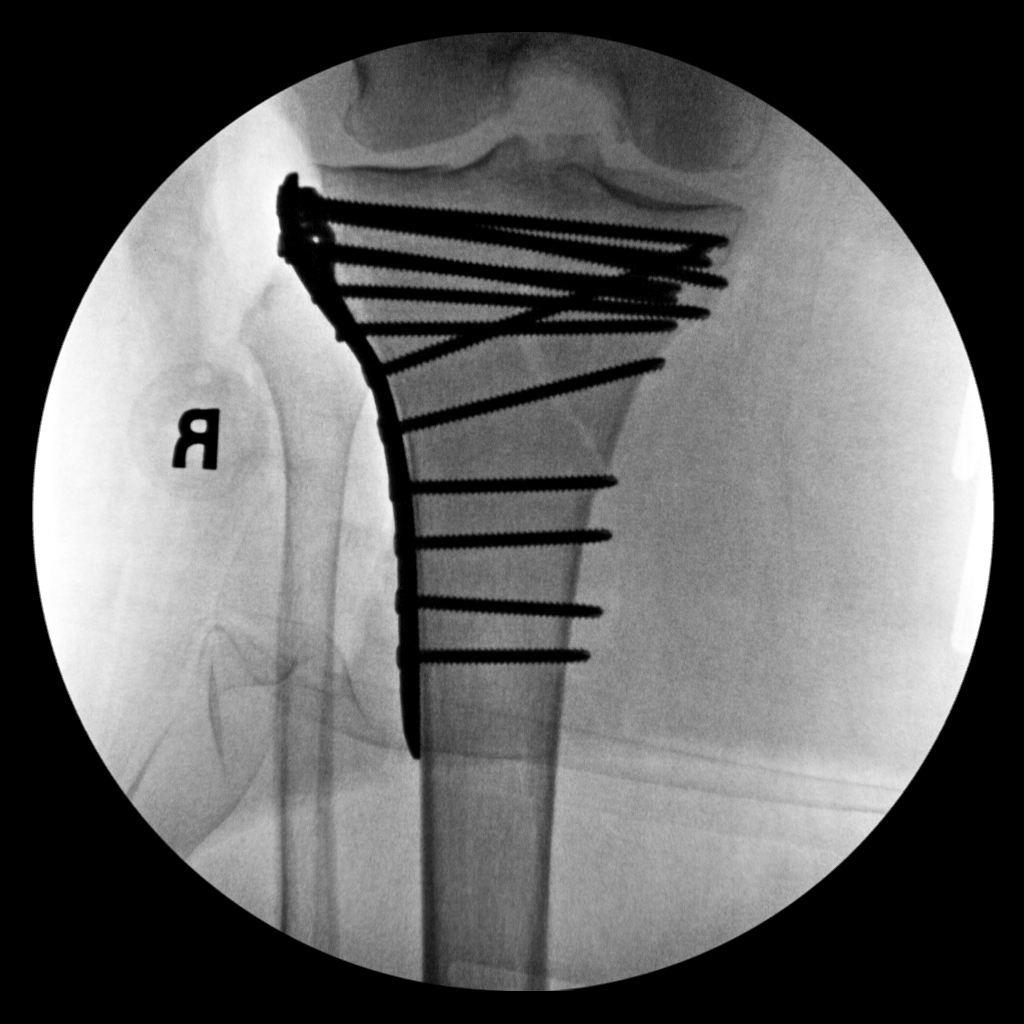
[im 2/2]
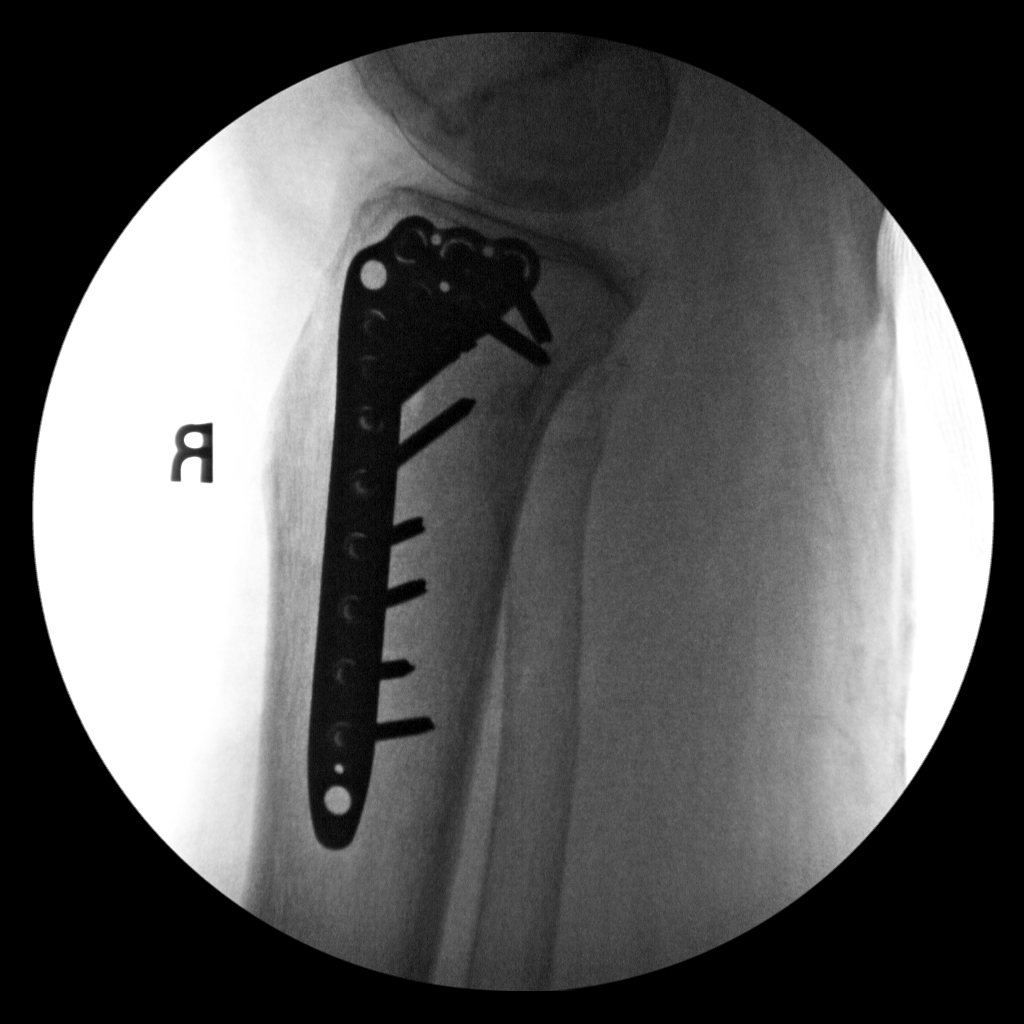

[2 of 2 positions shown; findings below may reference images not displayed]

FLUOROSCOPY TIME:  Radiation Exposure Index (as provided by the
fluoroscopic device): 4.42 mGy

If the device does not provide the exposure index:

Fluoroscopy Time:  1 minute 36 seconds

Number of Acquired Images:  2
FINDINGS: Fixation sideplate is noted along the lateral aspect of the proximal
tibia with multiple fixation screws. The known tibial plateau
fracture is again seen without significant displacement. No soft
tissue abnormality is noted.
IMPRESSION: ORIF of proximal tibial fracture.

## 2023-03-10 ENCOUNTER — Ambulatory Visit (INDEPENDENT_AMBULATORY_CARE_PROVIDER_SITE_OTHER): Payer: HMO | Admitting: Orthopaedic Surgery

## 2023-03-10 ENCOUNTER — Encounter: Payer: Self-pay | Admitting: Orthopaedic Surgery

## 2023-03-10 DIAGNOSIS — M1712 Unilateral primary osteoarthritis, left knee: Secondary | ICD-10-CM

## 2023-03-10 DIAGNOSIS — M25562 Pain in left knee: Secondary | ICD-10-CM

## 2023-03-10 DIAGNOSIS — G8929 Other chronic pain: Secondary | ICD-10-CM

## 2023-03-10 NOTE — Progress Notes (Signed)
The patient is already scheduled for knee replacement in 2 weeks.  This is of her left knee.  She came in today just to have more questions answered as it relates to the surgery and her stay in short-term skilled nursing after surgery.  She had a lot of appropriate questions that we were able to answer and addressed.  Her husband is with her today as well.  We will see her soon for her left total knee replacement.

## 2023-03-12 NOTE — Patient Instructions (Addendum)
SURGICAL WAITING ROOM VISITATION  Patients having surgery or a procedure may have no more than 2 support people in the waiting area - these visitors may rotate.    Children under the age of 93 must have an adult with them who is not the patient.  Due to an increase in RSV and influenza rates and associated hospitalizations, children ages 96 and under may not visit patients in [facility] hospitals.  If the patient needs to stay at the hospital during part of their recovery, the visitor guidelines for inpatient rooms apply. Pre-op nurse will coordinate an appropriate time for 1 support person to accompany patient in pre-op.  This support person may not rotate.    Please refer to the [facility] website for the visitor guidelines for Inpatients (after your surgery is over and you are in a regular room).       Your procedure is scheduled on: 03/26/23   Report to [facility] Main Entrance    Report to admitting at  10:45 AM   Call this number if you have problems the morning of surgery [phone]   Do not eat food :After Midnight.   After Midnight you may have the following liquids until 10:15 AM DAY OF SURGERY  Water Non-Citrus Juices (without pulp, NO RED-Apple, White grape, White cranberry) Black Coffee (NO MILK/CREAM OR CREAMERS, sugar ok)  Clear Tea (NO MILK/CREAM OR CREAMERS, sugar ok) regular and decaf                             Plain Jell-O (NO RED)                                           Fruit ices (not with fruit pulp, NO RED)                                     Popsicles (NO RED)                                                               Sports drinks like Gatorade (NO RED)             The day of surgery:  Drink ONE (1) Pre-Surgery Clear Ensure at 10:15 AM the morning of surgery. Drink in one sitting. Do not sip.  This drink was given to you during your hospital  pre-op appointment visit. Nothing else to drink after completing the  Pre-Surgery Clear  Ensure.    Oral Hygiene is also important to reduce your risk of infection.                                    Remember - BRUSH YOUR TEETH THE MORNING OF SURGERY WITH YOUR REGULAR TOOTHPASTE   Take these medicines the morning of surgery: Tylenol as needed, Rosuvastatin              You may not have any metal on your body including hair pins, jewelry,  and body piercing             Do not wear make-up, lotions, powders, perfumes, or deodorant  Do not wear nail polish including gel and S&S, artificial/acrylic nails, or any other type of covering on natural nails including finger and toenails. If you have artificial nails, gel coating, etc. that needs to be removed by a nail salon please have this removed prior to surgery or surgery may need to be canceled/ delayed if the surgeon/ anesthesia feels like they are unable to be safely monitored.   Do not shave  48 hours prior to surgery.    Do not bring valuables to the hospital. [facility] IS NOT             RESPONSIBLE   FOR VALUABLES.   Contacts, glasses, dentures or bridgework may not be worn into surgery.   Bring small overnight bag day of surgery.   DO NOT BRING YOUR HOME MEDICATIONS TO THE HOSPITAL. PHARMACY WILL DISPENSE MEDICATIONS LISTED ON YOUR MEDICATION LIST TO YOU DURING YOUR ADMISSION IN THE HOSPITAL!    Patients discharged on the day of surgery will not be allowed to drive home.  Someone NEEDS to stay with you for the first 24 hours after anesthesia.   Special Instructions: Bring a copy of your healthcare power of attorney and living will documents the day of surgery if you haven't scanned them before.              Please read over the following fact sheets you were given: IF YOU HAVE QUESTIONS ABOUT YOUR PRE-OP INSTRUCTIONS PLEASE CALL [phone] [facility]   If you received a COVID test during your pre-op visit  it is requested that you wear a mask when out in public, stay away from anyone that may not be feeling well and  notify your surgeon if you develop symptoms. If you test positive for Covid or have been in contact with anyone that has tested positive in the last 10 days please notify you surgeon.      Pre-operative 5 CHG Bath Instructions   You can play a key role in reducing the risk of infection after surgery. Your skin needs to be as free of germs as possible. You can reduce the number of germs on your skin by washing with CHG (chlorhexidine gluconate) soap before surgery. CHG is an antiseptic soap that kills germs and continues to kill germs even after washing.   DO NOT use if you have an allergy to chlorhexidine/CHG or antibacterial soaps. If your skin becomes reddened or irritated, stop using the CHG and notify one of our RNs at [phone].   Please shower with the CHG soap starting 4 days before surgery using the following schedule:     Please keep in mind the following:  DO NOT shave, including legs and underarms, starting the day of your first shower.   You may shave your face at any point before/day of surgery.  Place clean sheets on your bed the day you start using CHG soap. Use a clean washcloth (not used since being washed) for each shower. DO NOT sleep with pets once you start using the CHG.   CHG Shower Instructions:  If you choose to wash your hair and private area, wash first with your normal shampoo/soap.  After you use shampoo/soap, rinse your hair and body thoroughly to remove shampoo/soap residue.  Turn the water OFF and apply about 3 tablespoons (45 ml) of CHG soap to  a Animal nutritionist.  Apply CHG soap ONLY FROM YOUR NECK DOWN TO YOUR TOES (washing for 3-5 minutes)  DO NOT use CHG soap on face, private areas, open wounds, or sores.  Pay special attention to the area where your surgery is being performed.  If you are having back surgery, having someone wash your back for you may be helpful. Wait 2 minutes after CHG soap is applied, then you may rinse off the CHG soap.  Pat  dry with a clean towel  Put on clean clothes/pajamas   If you choose to wear lotion, please use ONLY the CHG-compatible lotions on the back of this paper.     Additional instructions for the day of surgery: DO NOT APPLY any lotions, deodorants, cologne, or perfumes.   Put on clean/comfortable clothes.  Brush your teeth.  Ask your nurse before applying any prescription medications to the skin.      CHG Compatible Lotions   Aveeno Moisturizing lotion  Cetaphil Moisturizing Cream  Cetaphil Moisturizing Lotion  Clairol Herbal Essence Moisturizing Lotion, Dry Skin  Clairol Herbal Essence Moisturizing Lotion, Extra Dry Skin  Clairol Herbal Essence Moisturizing Lotion, Normal Skin  Curel Age Defying Therapeutic Moisturizing Lotion with Alpha Hydroxy  Curel Extreme Care Body Lotion  Curel Soothing Hands Moisturizing Hand Lotion  Curel Therapeutic Moisturizing Cream, Fragrance-Free  Curel Therapeutic Moisturizing Lotion, Fragrance-Free  Curel Therapeutic Moisturizing Lotion, Original Formula  Eucerin Daily Replenishing Lotion  Eucerin Dry Skin Therapy Plus Alpha Hydroxy Crme  Eucerin Dry Skin Therapy Plus Alpha Hydroxy Lotion  Eucerin Original Crme  Eucerin Original Lotion  Eucerin Plus Crme Eucerin Plus Lotion  Eucerin TriLipid Replenishing Lotion  Keri Anti-Bacterial Hand Lotion  Keri Deep Conditioning Original Lotion Dry Skin Formula Softly Scented  Keri Deep Conditioning Original Lotion, Fragrance Free Sensitive Skin Formula  Keri Lotion Fast Absorbing Fragrance Free Sensitive Skin Formula  Keri Lotion Fast Absorbing Softly Scented Dry Skin Formula  Keri Original Lotion  Keri Skin Renewal Lotion Keri Silky Smooth Lotion  Keri Silky Smooth Sensitive Skin Lotion  Nivea Body Creamy Conditioning Oil  Nivea Body Extra Enriched Lotion  Nivea Body Original Lotion  Nivea Body Sheer Moisturizing Lotion Nivea Crme  Nivea Skin Firming Lotion  NutraDerm 30 Skin Lotion  NutraDerm  Skin Lotion  NutraDerm Therapeutic Skin Cream  NutraDerm Therapeutic Skin Lotion  ProShield Protective Hand Cream    Incentive Spirometer (  An incentive spirometer is a tool that can help keep your lungs clear and active. This tool measures how well you are filling your lungs with each breath. Taking long deep breaths may help reverse or decrease the chance of developing breathing (pulmonary) problems (especially infection) following: A long period of time when you are unable to move or be active. BEFORE THE PROCEDURE  If the spirometer includes an indicator to show your best effort, your nurse or respiratory therapist will set it to a desired goal. If possible, sit up straight or lean slightly forward. Try not to slouch. Hold the incentive spirometer in an upright position. INSTRUCTIONS FOR USE  Sit on the edge of your bed if possible, or sit up as far as you can in bed or on a chair. Hold the incentive spirometer in an upright position. Breathe out normally. Place the mouthpiece in your mouth and seal your lips tightly around it. Breathe in slowly and as deeply as possible, raising the piston or the ball toward the top of the column. Hold your breath for 3-5 seconds  or for as long as possible. Allow the piston or ball to fall to the bottom of the column. Remove the mouthpiece from your mouth and breathe out normally. Rest for a few seconds and repeat Steps 1 through 7 at least 10 times every 1-2 hours when you are awake. Take your time and take a few normal breaths between deep breaths. The spirometer may include an indicator to show your best effort. Use the indicator as a goal to work toward during each repetition. After each set of 10 deep breaths, practice coughing to be sure your lungs are clear. If you have an incision (the cut made at the time of surgery), support your incision when coughing by placing a pillow or rolled up towels firmly against it. Once you are able to get out of  bed, walk around indoors and cough well. You may stop using the incentive spirometer when instructed by your caregiver.  RISKS AND COMPLICATIONS Take your time so you do not get dizzy or light-headed. If you are in pain, you may need to take or ask for pain medication before doing incentive spirometry. It is harder to take a deep breath if you are having pain. AFTER USE Rest and breathe slowly and easily. It can be helpful to keep track of a log of your progress. Your caregiver can provide you with a simple table to help with this. If you are using the spirometer at home, follow these instructions: SEEK MEDICAL CARE IF:  You are having difficultly using the spirometer. You have trouble using the spirometer as often as instructed. Your pain medication is not giving enough relief while using the spirometer. You develop fever of 100.5 F (38.1 C) or higher. SEEK IMMEDIATE MEDICAL CARE IF:  You cough up bloody sputum that had not been present before. You develop fever of 102 F (38.9 C) or greater. You develop worsening pain at or near the incision site. MAKE SURE YOU:  Understand these instructions. Will watch your condition. Will get help right away if you are not doing well or get worse. Document Released: 12/28/2006 Document Revised: 11/09/2011 Document Reviewed: 02/28/2007 [facility] Patient Information 2014 [place], [place].

## 2023-03-12 NOTE — Progress Notes (Addendum)
COVID Vaccine received:  []  No [x]  Yes Date of any COVID positive Test in last 90 days: No PCP - Joetta Manners MD Cardiologist -   Chest x-ray -  EKG -  03/16/23  EPIC Stress Test - no ECHO - no Cardiac Cath - no  Bowel Prep - [x]  No  []   Yes ______  Pacemaker / ICD device [x]  No []  Yes   Spinal Cord Stimulator:[x]  No []  Yes       History of Sleep Apnea? [x]  No []  Yes   CPAP used?- [x]  No []  Yes    Does the patient monitor blood sugar?          [x]  No []  Yes  []  N/A  Patient has: [x]  NO Hx DM   []  Pre-DM                 []  DM1  []   DM2 Does patient have a [employer] or Dexacom? [x]  No []  Yes   Fasting Blood Sugar Ranges- n/a Checks Blood Sugar _____ times a dayn/a  GLP1 agonist / usual dose - No GLP1 instructions:  SGLT-2 inhibitors / usual dose - NO SGLT-2 instructions:   Blood Thinner / Instructions:No Aspirin Instructions:No  Comments:   Activity level: Patient is able to climb a flight of stairs without difficulty; [x]  No CP  [x]  No SOB, but would have ___   Patient can perform ADLs without assistance.   Anesthesia review:   Patient denies shortness of breath, fever, cough and chest pain at PAT appointment.  Patient verbalized understanding and agreement to the Pre-Surgical Instructions that were given to them at this PAT appointment. Patient was also educated of the need to review these PAT instructions again prior to his/her surgery.I reviewed the appropriate phone numbers to call if they have any and questions or concerns.

## 2023-03-16 ENCOUNTER — Encounter (HOSPITAL_COMMUNITY)
Admission: RE | Admit: 2023-03-16 | Discharge: 2023-03-16 | Disposition: A | Discharge status: Home / Self Care | Payer: HMO | Source: Ambulatory Visit | Attending: Orthopaedic Surgery | Admitting: Orthopaedic Surgery

## 2023-03-16 ENCOUNTER — Encounter (HOSPITAL_COMMUNITY): Payer: Self-pay

## 2023-03-16 ENCOUNTER — Other Ambulatory Visit: Payer: Self-pay

## 2023-03-16 VITALS — BP 150/88 | HR 65 | Temp 98.3°F | Resp 18 | Ht 68.0 in | Wt 229.0 lb

## 2023-03-16 DIAGNOSIS — R001 Bradycardia, unspecified: Secondary | ICD-10-CM | POA: Insufficient documentation

## 2023-03-16 DIAGNOSIS — I517 Cardiomegaly: Secondary | ICD-10-CM | POA: Insufficient documentation

## 2023-03-16 DIAGNOSIS — Z01818 Encounter for other preprocedural examination: Secondary | ICD-10-CM | POA: Diagnosis not present

## 2023-03-16 DIAGNOSIS — M1712 Unilateral primary osteoarthritis, left knee: Secondary | ICD-10-CM

## 2023-03-16 LAB — COMPREHENSIVE METABOLIC PANEL
ALT: 21 U/L (ref 0–44)
AST: 22 U/L (ref 15–41)
Albumin: 4.1 g/dL (ref 3.5–5.0)
Alkaline Phosphatase: 59 U/L (ref 38–126)
Anion gap: 10 (ref 5–15)
BUN: 20 mg/dL (ref 8–23)
CO2: 25 mmol/L (ref 22–32)
Calcium: 9.2 mg/dL (ref 8.9–10.3)
Chloride: 103 mmol/L (ref 98–111)
Creatinine, Ser: 0.95 mg/dL (ref 0.44–1.00)
GFR, Estimated: 60 mL/min — ABNORMAL LOW (ref >60)
Glucose, Bld: 101 mg/dL — ABNORMAL HIGH (ref 70–99)
Potassium: 3.7 mmol/L (ref 3.5–5.1)
Sodium: 138 mmol/L (ref 135–145)
Total Bilirubin: 0.8 mg/dL (ref 0.3–1.2)
Total Protein: 7.2 g/dL (ref 6.5–8.1)

## 2023-03-16 LAB — CBC
HCT: 40.7 % (ref 36.0–46.0)
Hemoglobin: 13.6 g/dL (ref 12.0–15.0)
MCH: 30.8 pg (ref 26.0–34.0)
MCHC: 33.4 g/dL (ref 30.0–36.0)
MCV: 92.1 fL (ref 80.0–100.0)
Platelets: 289 10*3/uL (ref 150–400)
RBC: 4.42 MIL/uL (ref 3.87–5.11)
RDW: 13.4 % (ref 11.5–15.5)
WBC: 6.4 10*3/uL (ref 4.0–10.5)
nRBC: 0 % (ref 0.0–0.2)

## 2023-03-16 LAB — SURGICAL PCR SCREEN
MRSA, PCR: NEGATIVE
Staphylococcus aureus: NEGATIVE

## 2023-03-25 NOTE — H&P (Signed)
TOTAL KNEE ADMISSION H&P  Patient is being admitted for left total knee arthroplasty.  Subjective:  Chief Complaint:left knee pain.  HPI: Alison Chandler, 82 y.o. female, has a history of pain and functional disability in the left knee due to arthritis and has failed non-surgical conservative treatments for greater than 12 weeks to includeNSAID's and/or analgesics, corticosteriod injections, viscosupplementation injections, flexibility and strengthening excercises, use of assistive devices, weight reduction as appropriate, and activity modification.  Onset of symptoms was gradual, starting 6 years ago with gradually worsening course since that time. The patient noted no past surgery on the left knee(s).  Patient currently rates pain in the left knee(s) at 10 out of 10 with activity. Patient has night pain, worsening of pain with activity and weight bearing, pain that interferes with activities of daily living, pain with passive range of motion, crepitus, and joint swelling.  Patient has evidence of subchondral sclerosis, periarticular osteophytes, and joint space narrowing by imaging studies. There is no active infection.  Patient Active Problem List   Diagnosis Date Noted   Fracture of right tibial plateau 07/15/2021   Closed fracture of right tibial plateau, initial encounter 07/15/2021   Unilateral primary osteoarthritis, left knee 12/19/2019   Benign essential hypertension 10/30/2019   Hypercalcemia 10/30/2019   Acute medial meniscal tear, right, initial encounter 08/16/2019   Iatrogenic hypocalcemia 06/05/2019   Primary hyperparathyroidism (HCC)    Nuclear sclerotic cataract of both eyes 10/05/2018   RPE mottling of macula 10/05/2018   Flat foot 06/20/2018   Tibialis posterior tendonitis, left 06/20/2018   Acute pain of right shoulder 05/04/2018   Chronic pain of left knee 12/22/2017   Subjective memory complaints 10/08/2017   It band syndrome, left 08/11/2017   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HCC)  03/23/2017   Meningioma (HCC) 03/23/2017   Idiopathic peripheral neuropathy 03/11/2017   Imbalance 03/11/2017   Paresthesias 12/06/2016   Tremor 12/06/2016   Age-related osteoporosis without current pathological fracture 12/03/2016   Nephrolithiasis 12/03/2016   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/21/2016   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   Hypertropia of left eye 09/05/2014   Intermittent exotropia of left eye 09/05/2014   Midline cystocele 05/10/2014   Stress bladder incontinence, female 05/10/2014   Benign neoplasm of colon 04/16/2014   Calculus of ureter 04/16/2014   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   Esophageal reflux 04/16/2014   Gross hematuria 04/16/2014   Headache 04/16/2014   Hypothyroidism 04/16/2014   Increased urinary frequency 04/16/2014   Keratoconjunctivitis sicca, 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 04/16/2014   Osteopenia, 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   Other seborrheic keratosis 04/16/2014   Other synovitis and tenosynovitis 04/16/2014   Presbycusis 04/16/2014   Primary localized osteoarthrosis, pelvic region and thigh 04/16/2014   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   Strabismus 04/16/2014   Urethral stenosis 04/16/2014   Urinary tract infection 04/16/2014   Allergic rhinitis 04/16/2014   Degenerative arthritis of hip 02/19/2012   Past Medical History:  Diagnosis Date   Arthritis    Hearing loss    History of kidney stones    Hyperlipidemia    Hypertension    Neuromuscular disorder (HCC)    neuropathy legs no medicine   Parathyroid dysfunction (HCC)    one Quit working was removed   Tremor, essential     Past Surgical History:  Procedure Laterality Date   colonscopy  01/27/2012   JOINT REPLACEMENT Right 2013   THR   KIDNEY STONE SURGERY     ORIF TIBIA PLATEAU Right 07/15/2021  Procedure: OPEN REDUCTION INTERNAL FIXATION (ORIF) RIGHT TIBIAL PLATEAU;  Surgeon: Kathryne Hitch, MD;  Location: [facility] OR;  Service: Orthopedics;  Laterality: Right;    PARATHYROIDECTOMY N/A 05/10/2019   Procedure: PARATHYROIDECTOMY;  Surgeon: Duanne Guess, MD;  Location: [facility] ORS;  Service: General;  Laterality: N/A;   TOTAL HIP ARTHROPLASTY  02/19/2012   Procedure: TOTAL HIP ARTHROPLASTY ANTERIOR APPROACH;  Surgeon: Kathryne Hitch, MD;  Location: [facility] ORS;  Service: Orthopedics;  Laterality: Right;    No current facility-administered medications for this encounter.   Current Outpatient Medications  Medication Sig Dispense Refill Last Dose   acetaminophen (TYLENOL) 500 MG tablet Take 500 mg by mouth in the morning and at bedtime.      atenolol-chlorthalidone (TENORETIC) 50-25 MG tablet Take 1 tablet by mouth daily.      CALCIUM PO Take 1 tablet by mouth daily.      Cholecalciferol (DIALYVITE VITAMIN D 5000) 125 MCG (5000 UT) capsule Take 5,000 Units by mouth every evening.      Cyanocobalamin (VITAMIN B-12 PO) Take 2,500 mcg by mouth every evening.      GLUCOSAMINE-CHONDROITIN PO Take 1 tablet by mouth every evening.      naproxen sodium (ALEVE) 220 MG tablet Take 220 mg by mouth daily as needed (pain).      Omega-3 Fatty Acids (FISH OIL) 1200 MG CAPS Take 1,200 mg by mouth every evening.      Polyethyl Glycol-Propyl Glycol (SYSTANE OP) Place 1 drop into both eyes daily as needed (dry eyes).      rosuvastatin (CRESTOR) 20 MG tablet Take 1 tablet by mouth daily.      apixaban (ELIQUIS) 2.5 MG TABS tablet Take 1 tablet (2.5 mg total) by mouth 2 (two) times daily. (Patient not taking: Reported on 05/14/2022) 30 tablet 0    baclofen (LIORESAL) 10 MG tablet Take 1 tablet (10 mg total) by mouth 3 (three) times daily as needed for muscle spasms. (Patient not taking: Reported on 05/14/2022) 60 each 0    HYDROcodone-acetaminophen (NORCO/VICODIN) 5-325 MG tablet Take 1-2 tablets by mouth every 4 (four) hours as needed for moderate pain (pain score 4-6). (Patient not taking: Reported on 05/14/2022) 30 tablet 0    methocarbamol (ROBAXIN) 500 MG tablet Take 1  tablet (500 mg total) by mouth every 6 (six) hours as needed for muscle spasms. (Patient not taking: Reported on 05/14/2022) 30 tablet 0    naproxen (NAPROSYN) 500 MG tablet Take 1 tablet (500 mg total) by mouth 2 (two) times daily between meals as needed. (Patient not taking: Reported on 03/12/2023) 60 tablet 3 Not Taking   ondansetron (ZOFRAN ODT) 4 MG disintegrating tablet Take 1 tablet (4 mg total) by mouth every 8 (eight) hours as needed for nausea or vomiting. (Patient not taking: Reported on 05/14/2022) 20 tablet 0    traMADol (ULTRAM) 50 MG tablet Take 1-2 tablets (50-100 mg total) by mouth every 6 (six) hours as needed. (Patient not taking: Reported on 05/14/2022) 40 tablet 0    Allergies  Allergen Reactions   Actonel [Risedronate Sodium] Nausea Only   Amlodipine Nausea Only    Social History   Tobacco Use   Smoking status: Former    Current packs/day: 0.00    Average packs/day: 0.3 packs/day for 20.0 years (5.0 ttl pk-yrs)    Types: Cigarettes    Start date: 09/01/1951    Quit date: 09/01/1971    Years since quitting: 51.5   Smokeless tobacco: Never  Substance Use  Topics   Alcohol use: Not Currently    Alcohol/week: 1.0 standard drink of alcohol    Types: 1 Cans of beer per week    Family History  Problem Relation Age of Onset   Thyroid disease Mother    Lung cancer Mother 37   Breast cancer Mother 34   Parkinson's disease Father      Review of Systems  Objective:  Physical Exam Vitals reviewed.  Constitutional:      Appearance: Normal appearance.  HENT:     Head: Normocephalic and atraumatic.  Eyes:     Extraocular Movements: Extraocular movements intact.     Pupils: Pupils are equal, round, and reactive to light.  Cardiovascular:     Rate and Rhythm: Normal rate and regular rhythm.     Pulses: Normal pulses.  Pulmonary:     Effort: Pulmonary effort is normal.     Breath sounds: Normal breath sounds.  Abdominal:     Palpations: Abdomen is soft.   Musculoskeletal:     Cervical back: Normal range of motion and neck supple.     Left knee: Bony tenderness and crepitus present. Decreased range of motion. Tenderness present over the medial joint line, lateral joint line and patellar tendon. Abnormal alignment.  Neurological:     Mental Status: She is alert and oriented to person, place, and time.  Psychiatric:        Behavior: Behavior normal.     Vital signs in last 24 hours:    Labs:   Estimated body mass index is 34.82 kg/m as calculated from the following:   Height as of 03/16/23: 5\' 8"  (1.727 m).   Weight as of 03/16/23: 103.9 kg.   Imaging Review Plain radiographs demonstrate severe degenerative joint disease of the left knee(s). The overall alignment ismild valgus. The bone quality appears to be good for age and reported activity level.      Assessment/Plan:  End stage arthritis, left knee   The patient history, physical examination, clinical judgment of the provider and imaging studies are consistent with end stage degenerative joint disease of the left knee(s) and total knee arthroplasty is deemed medically necessary. The treatment options including medical management, injection therapy arthroscopy and arthroplasty were discussed at length. The risks and benefits of total knee arthroplasty were presented and reviewed. The risks due to aseptic loosening, infection, stiffness, patella tracking problems, thromboembolic complications and other imponderables were discussed. The patient acknowledged the explanation, agreed to proceed with the plan and consent was signed. Patient is being admitted for inpatient treatment for surgery, pain control, PT, OT, prophylactic antibiotics, VTE prophylaxis, progressive ambulation and ADL's and discharge planning. The patient is planning to be discharged home with home health services

## 2023-03-26 ENCOUNTER — Other Ambulatory Visit: Payer: Self-pay

## 2023-03-26 ENCOUNTER — Ambulatory Visit (HOSPITAL_COMMUNITY): Payer: HMO | Admitting: Anesthesiology

## 2023-03-26 ENCOUNTER — Inpatient Hospital Stay (HOSPITAL_COMMUNITY)
Admission: RE | Admit: 2023-03-26 | Discharge: 2023-03-30 | Disposition: A | Discharge status: Skilled Nursing Facility | Payer: HMO | Attending: Orthopaedic Surgery | Admitting: Orthopaedic Surgery

## 2023-03-26 ENCOUNTER — Encounter (HOSPITAL_COMMUNITY): Payer: Self-pay | Admitting: Orthopaedic Surgery

## 2023-03-26 ENCOUNTER — Encounter (HOSPITAL_COMMUNITY)
Admission: RE | Disposition: A | Discharge status: Skilled Nursing Facility | Payer: Self-pay | Source: Home / Self Care | Attending: Orthopaedic Surgery

## 2023-03-26 ENCOUNTER — Observation Stay (HOSPITAL_COMMUNITY): Payer: HMO

## 2023-03-26 DIAGNOSIS — E039 Hypothyroidism, unspecified: Secondary | ICD-10-CM | POA: Diagnosis present

## 2023-03-26 DIAGNOSIS — I10 Essential (primary) hypertension: Secondary | ICD-10-CM | POA: Diagnosis present

## 2023-03-26 DIAGNOSIS — E785 Hyperlipidemia, unspecified: Secondary | ICD-10-CM | POA: Diagnosis present

## 2023-03-26 DIAGNOSIS — G8929 Other chronic pain: Secondary | ICD-10-CM | POA: Diagnosis present

## 2023-03-26 DIAGNOSIS — Z8349 Family history of other endocrine, nutritional and metabolic diseases: Secondary | ICD-10-CM

## 2023-03-26 DIAGNOSIS — Z87891 Personal history of nicotine dependence: Secondary | ICD-10-CM

## 2023-03-26 DIAGNOSIS — Z820 Family history of epilepsy and other diseases of the nervous system: Secondary | ICD-10-CM

## 2023-03-26 DIAGNOSIS — M810 Age-related osteoporosis without current pathological fracture: Secondary | ICD-10-CM | POA: Diagnosis present

## 2023-03-26 DIAGNOSIS — Z79899 Other long term (current) drug therapy: Secondary | ICD-10-CM

## 2023-03-26 DIAGNOSIS — E210 Primary hyperparathyroidism: Secondary | ICD-10-CM | POA: Diagnosis present

## 2023-03-26 DIAGNOSIS — Z7901 Long term (current) use of anticoagulants: Secondary | ICD-10-CM

## 2023-03-26 DIAGNOSIS — Z96652 Presence of left artificial knee joint: Secondary | ICD-10-CM

## 2023-03-26 DIAGNOSIS — Z96641 Presence of right artificial hip joint: Secondary | ICD-10-CM | POA: Diagnosis present

## 2023-03-26 DIAGNOSIS — M1712 Unilateral primary osteoarthritis, left knee: Secondary | ICD-10-CM

## 2023-03-26 DIAGNOSIS — Z888 Allergy status to other drugs, medicaments and biological substances status: Secondary | ICD-10-CM

## 2023-03-26 DIAGNOSIS — K219 Gastro-esophageal reflux disease without esophagitis: Secondary | ICD-10-CM | POA: Diagnosis present

## 2023-03-26 HISTORY — PX: TOTAL KNEE ARTHROPLASTY: SHX125

## 2023-03-26 SURGERY — ARTHROPLASTY, KNEE, TOTAL
Anesthesia: Monitor Anesthesia Care | Site: Knee | Laterality: Left

## 2023-03-26 MED ORDER — BUPIVACAINE-EPINEPHRINE 0.25% -1:200000 IJ SOLN
INTRAMUSCULAR | Status: DC | PRN
  Administered 2023-03-26: 30 mL

## 2023-03-26 MED ORDER — PHENYLEPHRINE HCL (PRESSORS) 10 MG/ML IV SOLN
INTRAVENOUS | Status: AC
  Filled 2023-03-26: qty 1

## 2023-03-26 MED ORDER — ONDANSETRON HCL 4 MG/2ML IJ SOLN
INTRAMUSCULAR | Status: DC | PRN
  Administered 2023-03-26: 4 mg via INTRAVENOUS

## 2023-03-26 MED ORDER — BUPIVACAINE IN DEXTROSE 0.75-8.25 % IT SOLN
INTRATHECAL | Status: DC | PRN
  Administered 2023-03-26: 1.8 mL via INTRATHECAL

## 2023-03-26 MED ORDER — CEFAZOLIN SODIUM-DEXTROSE 1-4 GM/50ML-% IV SOLN
1.0000 g | Freq: Four times a day (QID) | INTRAVENOUS | Status: AC
  Administered 2023-03-26 – 2023-03-27 (×2): 1 g via INTRAVENOUS
  Filled 2023-03-26 (×2): qty 50

## 2023-03-26 MED ORDER — DOCUSATE SODIUM 100 MG PO CAPS
100.0000 mg | ORAL_CAPSULE | Freq: Two times a day (BID) | ORAL | Status: DC
  Administered 2023-03-26 – 2023-03-30 (×8): 100 mg via ORAL
  Filled 2023-03-26 (×8): qty 1

## 2023-03-26 MED ORDER — OXYCODONE HCL 5 MG PO TABS
5.0000 mg | ORAL_TABLET | ORAL | Status: DC | PRN
  Administered 2023-03-26 – 2023-03-28 (×5): 10 mg via ORAL
  Filled 2023-03-26 (×9): qty 2

## 2023-03-26 MED ORDER — ATENOLOL 50 MG PO TABS
50.0000 mg | ORAL_TABLET | Freq: Every day | ORAL | Status: DC
  Administered 2023-03-28 – 2023-03-30 (×3): 50 mg via ORAL
  Filled 2023-03-26 (×4): qty 1

## 2023-03-26 MED ORDER — PROPOFOL 500 MG/50ML IV EMUL
INTRAVENOUS | Status: DC | PRN
  Administered 2023-03-26: 75 ug/kg/min via INTRAVENOUS

## 2023-03-26 MED ORDER — ROSUVASTATIN CALCIUM 20 MG PO TABS
20.0000 mg | ORAL_TABLET | Freq: Every day | ORAL | Status: DC
  Administered 2023-03-26 – 2023-03-30 (×5): 20 mg via ORAL
  Filled 2023-03-26 (×5): qty 1

## 2023-03-26 MED ORDER — POLYETHYLENE GLYCOL 3350 17 G PO PACK: 17.0000 g | PACK | Freq: Every day | ORAL | Status: DC | PRN

## 2023-03-26 MED ORDER — LACTATED RINGERS IV SOLN: INTRAVENOUS | Status: DC

## 2023-03-26 MED ORDER — PROPOFOL 1000 MG/100ML IV EMUL
INTRAVENOUS | Status: AC
  Filled 2023-03-26: qty 100

## 2023-03-26 MED ORDER — TRANEXAMIC ACID-NACL 1000-0.7 MG/100ML-% IV SOLN
1000.0000 mg | INTRAVENOUS | Status: AC
  Administered 2023-03-26: 1000 mg via INTRAVENOUS
  Filled 2023-03-26: qty 100

## 2023-03-26 MED ORDER — ASPIRIN 81 MG PO CHEW
81.0000 mg | CHEWABLE_TABLET | Freq: Two times a day (BID) | ORAL | Status: DC
  Administered 2023-03-26 – 2023-03-30 (×8): 81 mg via ORAL
  Filled 2023-03-26 (×8): qty 1

## 2023-03-26 MED ORDER — PROPOFOL 10 MG/ML IV BOLUS
INTRAVENOUS | Status: AC
  Filled 2023-03-26: qty 20

## 2023-03-26 MED ORDER — AMISULPRIDE (ANTIEMETIC) 5 MG/2ML IV SOLN: 10.0000 mg | Freq: Once | INTRAVENOUS | Status: DC | PRN

## 2023-03-26 MED ORDER — ATENOLOL-CHLORTHALIDONE 50-25 MG PO TABS: 1.0000 | ORAL_TABLET | Freq: Every day | ORAL | Status: DC

## 2023-03-26 MED ORDER — METOCLOPRAMIDE HCL 5 MG/ML IJ SOLN: 5.0000 mg | Freq: Three times a day (TID) | INTRAMUSCULAR | Status: DC | PRN

## 2023-03-26 MED ORDER — DEXAMETHASONE SODIUM PHOSPHATE 10 MG/ML IJ SOLN
INTRAMUSCULAR | Status: AC
  Filled 2023-03-26: qty 1

## 2023-03-26 MED ORDER — OXYCODONE HCL 5 MG PO TABS: 5.0000 mg | ORAL_TABLET | Freq: Once | ORAL | Status: DC | PRN

## 2023-03-26 MED ORDER — ONDANSETRON HCL 4 MG/2ML IJ SOLN: 4.0000 mg | Freq: Four times a day (QID) | INTRAMUSCULAR | Status: DC | PRN

## 2023-03-26 MED ORDER — PHENOL 1.4 % MT LIQD: 1.0000 | OROMUCOSAL | Status: DC | PRN

## 2023-03-26 MED ORDER — ONDANSETRON HCL 4 MG PO TABS: 4.0000 mg | ORAL_TABLET | Freq: Four times a day (QID) | ORAL | Status: DC | PRN

## 2023-03-26 MED ORDER — OXYCODONE HCL 5 MG/5ML PO SOLN: 5.0000 mg | Freq: Once | ORAL | Status: DC | PRN

## 2023-03-26 MED ORDER — ACETAMINOPHEN 325 MG PO TABS
325.0000 mg | ORAL_TABLET | Freq: Four times a day (QID) | ORAL | Status: DC | PRN
  Administered 2023-03-27 – 2023-03-28 (×3): 650 mg via ORAL
  Filled 2023-03-26 (×3): qty 2

## 2023-03-26 MED ORDER — METHOCARBAMOL 500 MG IVPB - SIMPLE MED
INTRAVENOUS | Status: AC
  Filled 2023-03-26: qty 55

## 2023-03-26 MED ORDER — METHOCARBAMOL 500 MG IVPB - SIMPLE MED
500.0000 mg | Freq: Four times a day (QID) | INTRAVENOUS | Status: DC | PRN
  Administered 2023-03-26: 500 mg via INTRAVENOUS

## 2023-03-26 MED ORDER — HYDROMORPHONE HCL 1 MG/ML IJ SOLN: 0.5000 mg | INTRAMUSCULAR | Status: DC | PRN

## 2023-03-26 MED ORDER — VITAMIN D 25 MCG (1000 UNIT) PO TABS
5000.0000 [IU] | ORAL_TABLET | Freq: Every evening | ORAL | Status: DC
  Administered 2023-03-26 – 2023-03-29 (×4): 5000 [IU] via ORAL
  Filled 2023-03-26 (×4): qty 5

## 2023-03-26 MED ORDER — ACETAMINOPHEN 10 MG/ML IV SOLN: 1000.0000 mg | Freq: Once | INTRAVENOUS | Status: DC | PRN

## 2023-03-26 MED ORDER — CHLORTHALIDONE 25 MG PO TABS
25.0000 mg | ORAL_TABLET | Freq: Every day | ORAL | Status: DC
  Administered 2023-03-27 – 2023-03-30 (×4): 25 mg via ORAL
  Filled 2023-03-26 (×4): qty 1

## 2023-03-26 MED ORDER — BUPIVACAINE HCL (PF) 0.25 % IJ SOLN
INTRAMUSCULAR | Status: AC
  Filled 2023-03-26: qty 30

## 2023-03-26 MED ORDER — PHENYLEPHRINE HCL-NACL 20-0.9 MG/250ML-% IV SOLN
INTRAVENOUS | Status: DC | PRN
  Administered 2023-03-26: 30 ug/min via INTRAVENOUS

## 2023-03-26 MED ORDER — PANTOPRAZOLE SODIUM 40 MG PO TBEC
40.0000 mg | DELAYED_RELEASE_TABLET | Freq: Every day | ORAL | Status: DC
  Administered 2023-03-27 – 2023-03-30 (×4): 40 mg via ORAL
  Filled 2023-03-26 (×5): qty 1

## 2023-03-26 MED ORDER — CEFAZOLIN SODIUM-DEXTROSE 2-4 GM/100ML-% IV SOLN
2.0000 g | INTRAVENOUS | Status: AC
  Administered 2023-03-26: 2 g via INTRAVENOUS
  Filled 2023-03-26: qty 100

## 2023-03-26 MED ORDER — EPINEPHRINE PF 1 MG/ML IJ SOLN
INTRAMUSCULAR | Status: AC
  Filled 2023-03-26: qty 1

## 2023-03-26 MED ORDER — MIDAZOLAM HCL 2 MG/2ML IJ SOLN
1.0000 mg | INTRAMUSCULAR | Status: AC
  Administered 2023-03-26: 1 mg via INTRAVENOUS
  Filled 2023-03-26: qty 2

## 2023-03-26 MED ORDER — PROPOFOL 10 MG/ML IV BOLUS
INTRAVENOUS | Status: DC | PRN
  Administered 2023-03-26: 20 mg via INTRAVENOUS

## 2023-03-26 MED ORDER — MENTHOL 3 MG MT LOZG: 1.0000 | LOZENGE | OROMUCOSAL | Status: DC | PRN

## 2023-03-26 MED ORDER — FENTANYL CITRATE PF 50 MCG/ML IJ SOSY
25.0000 ug | PREFILLED_SYRINGE | INTRAMUSCULAR | Status: DC | PRN
  Administered 2023-03-26: 50 ug via INTRAVENOUS

## 2023-03-26 MED ORDER — METOCLOPRAMIDE HCL 5 MG PO TABS: 5.0000 mg | ORAL_TABLET | Freq: Three times a day (TID) | ORAL | Status: DC | PRN

## 2023-03-26 MED ORDER — ALUM & MAG HYDROXIDE-SIMETH 200-200-20 MG/5ML PO SUSP: 30.0000 mL | ORAL | Status: DC | PRN

## 2023-03-26 MED ORDER — DEXAMETHASONE SODIUM PHOSPHATE 10 MG/ML IJ SOLN
INTRAMUSCULAR | Status: DC | PRN
  Administered 2023-03-26: 8 mg via INTRAVENOUS

## 2023-03-26 MED ORDER — SODIUM CHLORIDE 0.9 % IR SOLN
Status: DC | PRN
  Administered 2023-03-26: 1000 mL

## 2023-03-26 MED ORDER — STERILE WATER FOR IRRIGATION IR SOLN
Status: DC | PRN
  Administered 2023-03-26: 2000 mL

## 2023-03-26 MED ORDER — OXYCODONE HCL 5 MG PO TABS
10.0000 mg | ORAL_TABLET | ORAL | Status: DC | PRN
  Administered 2023-03-27 – 2023-03-29 (×8): 10 mg via ORAL
  Filled 2023-03-26 (×4): qty 2

## 2023-03-26 MED ORDER — CHLORHEXIDINE GLUCONATE 0.12 % MT SOLN
15.0000 mL | Freq: Once | OROMUCOSAL | Status: AC
  Administered 2023-03-26: 15 mL via OROMUCOSAL

## 2023-03-26 MED ORDER — DIPHENHYDRAMINE HCL 12.5 MG/5ML PO ELIX
12.5000 mg | ORAL_SOLUTION | ORAL | Status: DC | PRN
  Administered 2023-03-29: 25 mg via ORAL
  Filled 2023-03-26: qty 10

## 2023-03-26 MED ORDER — FENTANYL CITRATE PF 50 MCG/ML IJ SOSY
PREFILLED_SYRINGE | INTRAMUSCULAR | Status: AC
  Filled 2023-03-26: qty 1

## 2023-03-26 MED ORDER — ONDANSETRON HCL 4 MG/2ML IJ SOLN
INTRAMUSCULAR | Status: AC
  Filled 2023-03-26: qty 2

## 2023-03-26 MED ORDER — FENTANYL CITRATE PF 50 MCG/ML IJ SOSY
50.0000 ug | PREFILLED_SYRINGE | INTRAMUSCULAR | Status: AC
  Administered 2023-03-26: 50 ug via INTRAVENOUS
  Filled 2023-03-26: qty 2

## 2023-03-26 MED ORDER — ORAL CARE MOUTH RINSE: 15.0000 mL | Freq: Once | OROMUCOSAL | Status: AC

## 2023-03-26 MED ORDER — 0.9 % SODIUM CHLORIDE (POUR BTL) OPTIME
TOPICAL | Status: DC | PRN
  Administered 2023-03-26: 1000 mL

## 2023-03-26 MED ORDER — POVIDONE-IODINE 10 % EX SWAB
2.0000 | Freq: Once | CUTANEOUS | Status: AC
  Administered 2023-03-26: 2 via TOPICAL

## 2023-03-26 MED ORDER — SODIUM CHLORIDE 0.9 % IV SOLN: INTRAVENOUS | Status: DC

## 2023-03-26 MED ORDER — METHOCARBAMOL 500 MG PO TABS
500.0000 mg | ORAL_TABLET | Freq: Four times a day (QID) | ORAL | Status: DC | PRN
  Administered 2023-03-26 – 2023-03-30 (×8): 500 mg via ORAL
  Filled 2023-03-26 (×8): qty 1

## 2023-03-26 MED ORDER — ACETAMINOPHEN 500 MG PO TABS
1000.0000 mg | ORAL_TABLET | Freq: Once | ORAL | Status: AC
  Administered 2023-03-26: 1000 mg via ORAL
  Filled 2023-03-26: qty 2

## 2023-03-26 SURGICAL SUPPLY — 63 items
APL SKNCLS STERI-STRIP NONHPOA (GAUZE/BANDAGES/DRESSINGS)
BAG COUNTER SPONGE SURGICOUNT (BAG) IMPLANT
BAG SPEC THK2 15X12 ZIP CLS (MISCELLANEOUS) ×1
BAG SPNG CNTER NS LX DISP (BAG) ×1
BAG ZIPLOCK 12X15 (MISCELLANEOUS) ×1 IMPLANT
BENZOIN TINCTURE PRP APPL 2/3 (GAUZE/BANDAGES/DRESSINGS) IMPLANT
BLADE SAG 18X100X1.27 (BLADE) ×1 IMPLANT
BLADE SURG SZ10 CARB STEEL (BLADE) ×2 IMPLANT
BNDG CMPR 6 X 5 YARDS HK CLSR (GAUZE/BANDAGES/DRESSINGS) ×2
BNDG CMPR MED 10X6 ELC LF (GAUZE/BANDAGES/DRESSINGS)
BNDG ELASTIC 6INX 5YD STR LF (GAUZE/BANDAGES/DRESSINGS) ×2 IMPLANT
BNDG ELASTIC 6X10 VLCR STRL LF (GAUZE/BANDAGES/DRESSINGS) IMPLANT
BOWL SMART MIX CTS (DISPOSABLE) IMPLANT
CEMENT BONE R 1X40 (Cement) IMPLANT
COMP TIB PS KNEE E 0D LT (Joint) ×1 IMPLANT
COMPONET TIB PS KNEE E 0D LT (Joint) IMPLANT
COOLER ICEMAN CLASSIC (MISCELLANEOUS) ×1 IMPLANT
COVER SURGICAL LIGHT HANDLE (MISCELLANEOUS) ×1 IMPLANT
CUFF TOURN SGL QUICK 34 (TOURNIQUET CUFF) ×1
CUFF TRNQT CYL 34X4.125X (TOURNIQUET CUFF) ×1 IMPLANT
DRAPE INCISE IOBAN 66X45 STRL (DRAPES) ×1 IMPLANT
DRAPE U-SHAPE 47X51 STRL (DRAPES) ×1 IMPLANT
DURAPREP 26ML APPLICATOR (WOUND CARE) ×1 IMPLANT
ELECT BLADE TIP CTD 4 INCH (ELECTRODE) ×1 IMPLANT
ELECT REM PT RETURN 15FT ADLT (MISCELLANEOUS) ×1 IMPLANT
FEMORAL KNEE COMP SZ 8 STND LT (Knees) ×1 IMPLANT
FEMORAL KNEE COMP SZ 8STD LT (Knees) IMPLANT
GAUZE PAD ABD 8X10 STRL (GAUZE/BANDAGES/DRESSINGS) ×2 IMPLANT
GAUZE SPONGE 4X4 12PLY STRL (GAUZE/BANDAGES/DRESSINGS) ×1 IMPLANT
GAUZE XEROFORM 1X8 LF (GAUZE/BANDAGES/DRESSINGS) IMPLANT
GLOVE BIO SURGEON STRL SZ7.5 (GLOVE) ×1 IMPLANT
GLOVE BIOGEL PI IND STRL 8 (GLOVE) ×2 IMPLANT
GLOVE ECLIPSE 8.0 STRL XLNG CF (GLOVE) ×1 IMPLANT
GOWN STRL REUS W/ TWL XL LVL3 (GOWN DISPOSABLE) ×2 IMPLANT
GOWN STRL REUS W/TWL XL LVL3 (GOWN DISPOSABLE) ×4
HANDPIECE INTERPULSE COAX TIP (DISPOSABLE) ×1
HOLDER FOLEY CATH W/STRAP (MISCELLANEOUS) IMPLANT
IMMOBILIZER KNEE 20 (SOFTGOODS) ×1
IMMOBILIZER KNEE 20 THIGH 36 (SOFTGOODS) ×1 IMPLANT
KIT TURNOVER KIT A (KITS) IMPLANT
NS IRRIG 1000ML POUR BTL (IV SOLUTION) ×1 IMPLANT
PACK TOTAL KNEE CUSTOM (KITS) ×1 IMPLANT
PAD COLD SHLDR WRAP-ON (PAD) ×1 IMPLANT
PADDING CAST COTTON 6X4 STRL (CAST SUPPLIES) ×2 IMPLANT
PIN DRILL HDLS TROCAR 75 4PK (PIN) IMPLANT
PROTECTOR NERVE ULNAR (MISCELLANEOUS) ×1 IMPLANT
SCREW FEMALE HEX FIX 25X2.5 (ORTHOPEDIC DISPOSABLE SUPPLIES) IMPLANT
SET HNDPC FAN SPRY TIP SCT (DISPOSABLE) ×1 IMPLANT
SET PAD KNEE POSITIONER (MISCELLANEOUS) ×1 IMPLANT
SPIKE FLUID TRANSFER (MISCELLANEOUS) IMPLANT
STAPLER VISISTAT 35W (STAPLE) IMPLANT
STEM ARTISURF EF 12 SZ8-11 (Stem) IMPLANT
STEM POLY PAT PLY 32M KNEE (Knees) IMPLANT
STRIP CLOSURE SKIN 1/2X4 (GAUZE/BANDAGES/DRESSINGS) IMPLANT
SUT MNCRL AB 4-0 PS2 18 (SUTURE) IMPLANT
SUT VIC AB 0 CT1 27 (SUTURE) ×1
SUT VIC AB 0 CT1 27XBRD ANTBC (SUTURE) ×1 IMPLANT
SUT VIC AB 1 CT1 36 (SUTURE) ×2 IMPLANT
SUT VIC AB 2-0 CT1 27 (SUTURE) ×2
SUT VIC AB 2-0 CT1 TAPERPNT 27 (SUTURE) ×2 IMPLANT
TRAY FOLEY MTR SLVR 14FR STAT (SET/KITS/TRAYS/PACK) IMPLANT
TRAY FOLEY MTR SLVR 16FR STAT (SET/KITS/TRAYS/PACK) IMPLANT
WATER STERILE IRR 1000ML POUR (IV SOLUTION) ×2 IMPLANT

## 2023-03-26 NOTE — Anesthesia Procedure Notes (Signed)
Spinal  Patient location during procedure: OR Start time: 03/26/2023 12:52 PM End time: 03/26/2023 1:02 PM Reason for block: surgical anesthesia Staffing Performed: anesthesiologist  Anesthesiologist: Heather Roberts, MD Performed by: Heather Roberts, MD Authorized by: Heather Roberts, MD   Preanesthetic Checklist Completed: patient identified, IV checked, risks and benefits discussed, surgical consent, monitors and equipment checked, pre-op evaluation and timeout performed Spinal Block Patient position: sitting Prep: DuraPrep Patient monitoring: cardiac monitor, continuous pulse ox and blood pressure Approach: midline Location: L2-3 Injection technique: single-shot Needle Needle type: Pencan  Needle gauge: 24 G Needle length: 9 cm Assessment Events: CSF return Additional Notes Functioning IV was confirmed and monitors were applied. Sterile prep and drape, including hand hygiene and sterile gloves were used. The patient was positioned and the spine was prepped. The skin was anesthetized with lidocaine.  Free flow of clear CSF was obtained prior to injecting local anesthetic into the CSF.  The spinal needle aspirated freely following injection.  The needle was carefully withdrawn.  The patient tolerated the procedure well. Difficult placement.

## 2023-03-26 NOTE — Transfer of Care (Signed)
Immediate Anesthesia Transfer of Care Note  Patient: Alison Chandler  Procedure(s) Performed: LEFT TOTAL KNEE ARTHROPLASTY (Left: Knee)  Patient Location: PACU  Anesthesia Type:Spinal  Level of Consciousness: drowsy  Airway & Oxygen Therapy: Patient Spontanous Breathing and Patient connected to face mask oxygen  Post-op Assessment: Report given to RN and Post -op Vital signs reviewed and stable  Post vital signs: Reviewed and stable  Last Vitals:  Vitals Value Taken Time  BP 115/67   Temp    Pulse 56   Resp 12   SpO2 115/67     Last Pain:  Vitals:   03/26/23 1145  TempSrc:   PainSc: Asleep         Complications: No notable events documented.

## 2023-03-26 NOTE — Interval H&P Note (Signed)
History and Physical Interval Note: The patient understands that she is here today for a left total knee replacement to treat her severe left knee arthritis.  There has been no acute or interval change in her medical status.  The risks and benefits of surgery been discussed in detail and informed consent has been obtained.  The left operative knee has been marked.  03/26/2023 11:21 AM  Alison Chandler  has presented today for surgery, with the diagnosis of osteoarthritis left knee.  The various methods of treatment have been discussed with the patient and family. After consideration of risks, benefits and other options for treatment, the patient has consented to  Procedure(s): LEFT TOTAL KNEE ARTHROPLASTY (Left) as a surgical intervention.  The patient's history has been reviewed, patient examined, no change in status, stable for surgery.  I have reviewed the patient's chart and labs.  Questions were answered to the patient's satisfaction.     Kathryne Hitch

## 2023-03-26 NOTE — Anesthesia Preprocedure Evaluation (Addendum)
Anesthesia Evaluation  Patient identified by MRN, date of birth, ID band Patient awake    Reviewed: Allergy & Precautions, NPO status , Patient's Chart, lab work & pertinent test results  History of Anesthesia Complications Negative for: history of anesthetic complications  Airway Mallampati: III  TM Distance: >3 FB Neck ROM: Full    Dental no notable dental hx.    Pulmonary former smoker   Pulmonary exam normal breath sounds clear to auscultation       Cardiovascular hypertension, Pt. on home beta blockers Normal cardiovascular exam Rhythm:Regular Rate:Normal  ECG: rate 47   Neuro/Psych  Headaches  negative psych ROS   GI/Hepatic negative GI ROS, Neg liver ROS,,,  Endo/Other  negative endocrine ROS    Renal/GU negative Renal ROS     Musculoskeletal  (+) Arthritis ,    Abdominal   Peds  Hematology HLD   Anesthesia Other Findings Right tibial plateau fracture  Reproductive/Obstetrics                             Anesthesia Physical Anesthesia Plan  ASA: 2  Anesthesia Plan: Spinal and MAC   Post-op Pain Management: Tylenol PO (pre-op)*   Induction:   PONV Risk Score and Plan: 2 and Ondansetron, Dexamethasone, Propofol infusion and Treatment may vary due to age or medical condition  Airway Management Planned: Simple Face Mask  Additional Equipment:   Intra-op Plan:   Post-operative Plan:   Informed Consent: I have reviewed the patients History and Physical, chart, labs and discussed the procedure including the risks, benefits and alternatives for the proposed anesthesia with the patient or authorized representative who has indicated his/her understanding and acceptance.     Dental advisory given  Plan Discussed with: Anesthesiologist and CRNA  Anesthesia Plan Comments:         Anesthesia Quick Evaluation

## 2023-03-26 NOTE — Anesthesia Procedure Notes (Signed)
Procedure Name: MAC Date/Time: 03/26/2023 12:55 PM  Performed by: Nelle Don, CRNAPre-anesthesia Checklist: Patient identified, Emergency Drugs available, Patient being monitored and Suction available Oxygen Delivery Method: Simple face mask

## 2023-03-26 NOTE — Anesthesia Postprocedure Evaluation (Signed)
Anesthesia Post Note  Patient: [name]  Procedure(s) Performed: LEFT TOTAL KNEE ARTHROPLASTY (Left: Knee)     Patient location during evaluation: PACU Anesthesia Type: MAC and Spinal Level of consciousness: awake and alert Pain management: pain level controlled Vital Signs Assessment: post-procedure vital signs reviewed and stable Respiratory status: spontaneous breathing and respiratory function stable Cardiovascular status: blood pressure returned to baseline and stable Postop Assessment: spinal receding Anesthetic complications: no  No notable events documented.  Last Vitals:  Vitals:   03/26/23 1530 03/26/23 1545  BP: 116/61 136/74  Pulse: (!) 51 (!) 50  Resp:    Temp: 36.6 C   SpO2: 96% 99%    Last Pain:  Vitals:   03/26/23 1545  TempSrc:   PainSc: 0-No pain    LLE Motor Response: Purposeful movement (03/26/23 1551) LLE Sensation: Decreased;Numbness (03/26/23 1551) RLE Motor Response: Purposeful movement (03/26/23 1551) RLE Sensation: Decreased;Numbness (03/26/23 1551) L Sensory Level: L4-Anterior knee, lower leg (03/26/23 1551) R Sensory Level: S1-Sole of foot, small toes (03/26/23 1557)  Alison Chandler

## 2023-03-26 NOTE — Op Note (Signed)
Operative Note  Date of operation: 03/26/2023 Preoperative diagnosis: Left knee primary osteoarthritis Postoperative diagnosis: Same  Procedure: Left cemented total knee arthroplasty  Implants: Biomet/Zimmer persona knee system Implant Name Type Inv. Item Serial No. Manufacturer Lot No. LRB No. Used Action  CEMENT BONE R 1X40 - [id number] Cement CEMENT BONE R 1X40  ZIMMER RECON(ORTH,TRAU,BIO,SG) [id number] Left 2 Implanted  STEM ARTISURF EF 12 SZ8-11 - [id number] Stem STEM ARTISURF EF 12 SZ8-11  ZIMMER RECON(ORTH,TRAU,BIO,SG) [id number] Left 1 Implanted  FEMORAL KNEE COMP SZ 8 STND LT - [id number] Knees FEMORAL KNEE COMP SZ 8 STND LT  ZIMMER RECON(ORTH,TRAU,BIO,SG) [id number] Left 1 Implanted  COMP TIB PS KNEE E 0D LT - [id number] Joint COMP TIB PS KNEE E 0D LT  ZIMMER RECON(ORTH,TRAU,BIO,SG) [id number] Left 1 Implanted  STEM POLY PAT PLY 50M KNEE - [id number] Knees STEM POLY PAT PLY 50M KNEE  ZIMMER RECON(ORTH,TRAU,BIO,SG) [id number] Left 1 Implanted   Surgeon: Vanita Panda. Magnus Ivan, MD Assistant: Rexene Edison, PA-C  Anesthesia: #1 left lower extremity adductor canal block, #2 spinal, #3 local Antibiotics: 2 g IV Ancef Tourniquet time less than 1 hour EBL: Less than 100 cc Complications: None  Indications: The patient is an 82 year old female well-known to me.  I have been seeing her for many years as a relates to arthritis of her left knee.  She has tried and failed conservative treatment for now well over 6 years.  It has gotten to the point where her left knee pain is daily and it is detrimentally affecting her mobility, her quality of life, and her actives of daily living.  She does wish to proceed with a knee replacement at this point we agree with this as well.  We did discuss in length in detail the risk of acute blood loss anemia, nerve or vessel injury, fracture, infection, DVT, implant failure, and wound healing issues.  She understands her goals are hopefully decrease pain, improve  mobility, and improve quality of life.  Procedure description: After informed consent was obtained and the appropriate left knee was marked, anesthesia obtained a left lower extremity adductor canal block in the holding room.  The patient was then brought to the operating room and sat up on the operating table where spinal anesthesia was obtained.  She was then laid in supine position on the operating table and a Foley catheter was placed.  A nonsterile tourniquet is placed around her upper left thigh and the left thigh, knee, leg, ankle were prepped and draped with DuraPrep and sterile drapes including a sterile stockinette.  A timeout was called and she was benefits correct patient the correct left knee.  An Esmarch was then used to wrap out the leg and the tourniquet was inflated to 300 mm of pressure.  With the knee in extended position a midline incision was made over the patella and carried proximally and distally.  Dissection was carried down to the knee joint and a medial parapatellar arthrotomy was made finding a large joint effusion.  With the knee in a flexed position we removed osteophytes in all 3 compartments and found significant cartilage wear throughout the knee.  The ACL and medial lateral meniscus were removed.  We then used the extramedullary cutting guide for making her proximal tibia cut correction for varus and valgus and a 7 degree slope.  We made this cut to take 2 mm off the low side.  We made the cut without difficulty and it was a thin cut so we backed it down to  more millimeters.  We then used a intramedullary cutting guide for the femur.  This was for distal femoral cut for a left knee at 5 degrees externally rotated.  This was for a 10 mm cut.  We made that cut without difficulty and brought the knee back down to full extension and had full extension with a 10 mm extension block.  We then went back to the femur and put a femoral sizing guide based off the epicondylar axis.  Based off  of this we chose a size 8 femur.  We put a 4-in-1 cutting block for a size 8 femur and made her anterior and posterior cuts followed our chamfer cuts.  We then went back to the tibia and chose a size E tibial tray for left knee with coverage over the tibial plateau setting the rotation of the tibial tubercle on the femur.  We did our drill hole and keel punch off of this.  We trialed the size E left tibial component followed by the size 8 left CR standard femur.  We placed a 10 mm left medial congruent polyethylene insert and went up to a 12 mm insert and I was pleased with range of motion and stability with a 12 mm insert.  We then made a patella cut and drilled 3 holes for a size 32 cemented patella button.  With all trial instrumentation the knee again we put her through several cycles of motion and we are pleased with range of motion and stability.  I will trial instrumentation from the was removed and the knee was irrigated with normal saline solution using pulsatile lavage.  We then placed our Marcaine with epinephrine around the arthrotomy.  The cement was mixed in with the knee in a flexed position we cemented our Biomet Zimmer persona tibial tray for a left knee size E followed by cementing our size 8 CR left standard femur.  We removed excess cement debris from the knee and placed our 12 mm thickness left polythene insert.  We then cemented our size 32 patella button.  The knee was then held compressed and fully extended while the cement hardened.  Once the cemented hardened the tourniquet was let down and hemostasis was obtained with electrocautery.  The arthrotomy was then closed with interrupted #1 Vicryl suture followed by 0 Vicryl close deep tissue and 2-0 Vicryl to close subcutaneous tissue.  The skin was closed with staples.  Well-padded sterile dressing was applied.  She was taken the recovery room in stable condition.  Rexene Edison, PA-C did assist during the entire case from beginning to end and his  assistance was crucial and medically necessary for soft tissue management and retraction, helping guide implant placement and a layered closure of the wound.

## 2023-03-26 NOTE — Evaluation (Signed)
Physical Therapy Evaluation Patient Details Name: Alison Chandler MRN: [id number] DOB: 08-30-1941 Today's Date: 03/26/2023  History of Present Illness  82 yo female presents to therapy s/p L TKA on 03/26/2023 due to failure of conservative measures. Pt PMH includes but is not limited to: R tibial plateau fx, HTN, L ITB syndrome, meningioma, peripheral neuropathy, CT syndrome L wrist, hypothyroidism,parathyroid dysfunction, HOH, kidney stones, HLD, essential tremor, R THA (2013).  Clinical Impression      Alison Chandler is a 82 y.o. female POD 0 s/p L TKA. Patient reports mod I with mobility at baseline. Patient is now limited by functional impairments (see PT problem list below) and requires min guard for bed mobility and min guard and cues for transfers. Patient was able to ambulate 25 feet with RW and min guard level of assist. Patient instructed in exercise to facilitate ROM and circulation to manage edema.  Patient will benefit from continued skilled PT interventions to address impairments and progress towards PLOF. Acute PT will follow to progress mobility and stair training in preparation for safe discharge to next venue with ongoing therapy services < 3 hr/day.     Assistance Recommended at Discharge Intermittent Supervision/Assistance  If plan is discharge home, recommend the following:  Can travel by private vehicle  A little help with walking and/or transfers;A little help with bathing/dressing/bathroom;Assistance with cooking/housework;Assist for transportation;Help with stairs or ramp for entrance        Equipment Recommendations None recommended by PT  Recommendations for Other Services       Functional Status Assessment Patient has had a recent decline in their functional status and demonstrates the ability to make significant improvements in function in a reasonable and predictable amount of time.     Precautions / Restrictions Precautions Precautions:  Knee;Fall Restrictions Weight Bearing Restrictions: No      Mobility  Bed Mobility Overal bed mobility: Needs Assistance Bed Mobility: Supine to Sit     Supine to sit: HOB elevated, Min guard     General bed mobility comments: min cues    Transfers Overall transfer level: Needs assistance Equipment used: Rolling walker (2 wheels) Transfers: Sit to/from Stand Sit to Stand: Min guard           General transfer comment: cues for proper UE placement, pt expresses discomfort from IV site L hand    Ambulation/Gait Ambulation/Gait assistance: Min guard Gait Distance (Feet): 25 Feet Assistive device: Rolling walker (2 wheels) Gait Pattern/deviations: Step-to pattern, Trunk flexed, Decreased stance time - left, Antalgic (head forward) Gait velocity: decreased     General Gait Details: pt reports fear of falling, requires cues for posture and proper distance from [employer]            Wheelchair Mobility     Tilt Bed    Modified Rankin (Stroke Patients Only)       Balance Overall balance assessment: Needs assistance (no recent falls) Sitting-balance support: Feet supported Sitting balance-Leahy Scale: Good     Standing balance support: Bilateral upper extremity supported, During functional activity, Reliant on assistive device for balance Standing balance-Leahy Scale: Poor                               Pertinent Vitals/Pain Pain Assessment Pain Assessment: 0-10 Pain Score: 7  Pain Location: L knee/leg Pain Descriptors / Indicators: Aching, Constant, Discomfort, Operative site guarding    Home Living Family/patient expects to  be discharged to:: Private residence Living Arrangements: Spouse/significant other Available Help at Discharge: Family Type of Home: House Home Access: Stairs to enter Entrance Stairs-Rails: None (uses door frame) Entrance Stairs-Number of Steps: 1   Home Layout: One level Home Equipment: Agricultural consultant (2  wheels);Cane - single point;Shower seat;BSC/3in1 Additional Comments: Pt reports she anticipates d/c to SNF for short term rehab    Prior Function Prior Level of Function : Independent/Modified Independent             Mobility Comments: mod I with SPC for all ADLs, self care tasks and IADLs       Hand Dominance        Extremity/Trunk Assessment        Lower Extremity Assessment Lower Extremity Assessment: LLE deficits/detail LLE Deficits / Details: L ankle DF/PF 5/5; SLR < 10 degree lag LLE Sensation: WNL    Cervical / Trunk Assessment Cervical / Trunk Assessment: Kyphotic  Communication   Communication: HOH  Cognition Arousal/Alertness: Awake/alert Behavior During Therapy: WFL for tasks assessed/performed Overall Cognitive Status: Within Functional Limits for tasks assessed                                          General Comments      Exercises Total Joint Exercises Ankle Circles/Pumps: AROM, 20 reps, Both   Assessment/Plan    PT Assessment Patient needs continued PT services  PT Problem List Decreased strength;Decreased range of motion;Decreased activity tolerance;Decreased balance;Decreased mobility;Decreased coordination;Pain       PT Treatment Interventions DME instruction;Gait training;Stair training;Functional mobility training;Therapeutic activities;Therapeutic exercise;Balance training;Neuromuscular re-education;Patient/family education;Modalities    PT Goals (Current goals can be found in the Care Plan section)  Acute Rehab PT Goals Patient Stated Goal: to be able to walk normally PT Goal Formulation: With patient Time For Goal Achievement: 04/16/23 Potential to Achieve Goals: Good    Frequency 7X/week     Co-evaluation               AM-PAC PT "6 Clicks" Mobility  Outcome Measure Help needed turning from your back to your side while in a flat bed without using bedrails?: A Little Help needed moving from lying on  your back to sitting on the side of a flat bed without using bedrails?: A Little Help needed moving to and from a bed to a chair (including a wheelchair)?: A Little Help needed standing up from a chair using your arms (e.g., wheelchair or bedside chair)?: A Little Help needed to walk in hospital room?: A Little Help needed climbing 3-5 steps with a railing? : A Lot 6 Click Score: 17    End of Session Equipment Utilized During Treatment: Gait belt Activity Tolerance: Patient tolerated treatment well Patient left: in chair;with call bell/phone within reach;with family/visitor present;with nursing/sitter in room Nurse Communication: Mobility status PT Visit Diagnosis: Unsteadiness on feet (R26.81);Other abnormalities of gait and mobility (R26.89);Muscle weakness (generalized) (M62.81);Difficulty in walking, not elsewhere classified (R26.2);Pain Pain - Right/Left: Left Pain - part of body: Knee;Leg    Time: 9604-5409 PT Time Calculation (min) (ACUTE ONLY): 39 min   Charges:   PT Evaluation $PT Eval Low Complexity: 1 Low PT Treatments $Gait Training: 8-22 mins $Therapeutic Activity: 8-22 mins PT General Charges $$ ACUTE PT VISIT: 1 Visit         Johnny Bridge, PT Acute Rehab   Jacqualyn Posey 03/26/2023, 7:27 PM

## 2023-03-27 DIAGNOSIS — Z7901 Long term (current) use of anticoagulants: Secondary | ICD-10-CM | POA: Diagnosis not present

## 2023-03-27 DIAGNOSIS — I10 Essential (primary) hypertension: Secondary | ICD-10-CM | POA: Diagnosis present

## 2023-03-27 DIAGNOSIS — Z87891 Personal history of nicotine dependence: Secondary | ICD-10-CM | POA: Diagnosis not present

## 2023-03-27 DIAGNOSIS — Z888 Allergy status to other drugs, medicaments and biological substances status: Secondary | ICD-10-CM | POA: Diagnosis not present

## 2023-03-27 DIAGNOSIS — G8929 Other chronic pain: Secondary | ICD-10-CM | POA: Diagnosis present

## 2023-03-27 DIAGNOSIS — Z79899 Other long term (current) drug therapy: Secondary | ICD-10-CM | POA: Diagnosis not present

## 2023-03-27 DIAGNOSIS — Z820 Family history of epilepsy and other diseases of the nervous system: Secondary | ICD-10-CM | POA: Diagnosis not present

## 2023-03-27 DIAGNOSIS — E785 Hyperlipidemia, unspecified: Secondary | ICD-10-CM | POA: Diagnosis present

## 2023-03-27 DIAGNOSIS — E210 Primary hyperparathyroidism: Secondary | ICD-10-CM | POA: Diagnosis present

## 2023-03-27 DIAGNOSIS — M810 Age-related osteoporosis without current pathological fracture: Secondary | ICD-10-CM | POA: Diagnosis present

## 2023-03-27 DIAGNOSIS — Z96641 Presence of right artificial hip joint: Secondary | ICD-10-CM | POA: Diagnosis present

## 2023-03-27 DIAGNOSIS — E039 Hypothyroidism, unspecified: Secondary | ICD-10-CM | POA: Diagnosis present

## 2023-03-27 DIAGNOSIS — Z96652 Presence of left artificial knee joint: Secondary | ICD-10-CM | POA: Diagnosis present

## 2023-03-27 DIAGNOSIS — Z8349 Family history of other endocrine, nutritional and metabolic diseases: Secondary | ICD-10-CM | POA: Diagnosis not present

## 2023-03-27 DIAGNOSIS — M1712 Unilateral primary osteoarthritis, left knee: Secondary | ICD-10-CM | POA: Diagnosis present

## 2023-03-27 DIAGNOSIS — K219 Gastro-esophageal reflux disease without esophagitis: Secondary | ICD-10-CM | POA: Diagnosis present

## 2023-03-27 NOTE — Progress Notes (Signed)
Subjective: 1 Day Post-Op Procedure(s) (LRB): LEFT TOTAL KNEE ARTHROPLASTY (Left) Patient reports pain as moderate.    Objective: Vital signs in last 24 hours: Temp:  [96.8 F (36 C)-98 F (36.7 C)] 97.4 F (36.3 C) (07/27 1015) Pulse Rate:  [47-66] 55 (07/27 1015) Resp:  [11-18] 15 (07/27 1015) BP: (106-157)/(61-94) 125/76 (07/27 1015) SpO2:  [92 %-99 %] 96 % (07/27 1015)  Intake/Output from previous day: 07/26 0701 - 07/27 0700 In: 2096.8 [P.O.:480; I.V.:1261.8; IV Piggyback:355] Out: 2450 [Urine:2400; Blood:50] Intake/Output this shift: Total I/O In: 240 [P.O.:240] Out: 50 [Urine:50]  Recent Labs    03/27/23 0343  HGB 12.2   Recent Labs    03/27/23 0343  WBC 12.5*  RBC 3.98  HCT 37.4  PLT 244   Recent Labs    03/27/23 0343  NA 137  K 3.9  CL 101  CO2 26  BUN 17  CREATININE 0.97  GLUCOSE 160*  CALCIUM 9.0   No results for input(s): "LABPT", "INR" in the last 72 hours.  Sensation intact distally Intact pulses distally Dorsiflexion/Plantar flexion intact Incision: dressing C/D/I No cellulitis present Compartment soft   Assessment/Plan: 1 Day Post-Op Procedure(s) (LRB): LEFT TOTAL KNEE ARTHROPLASTY (Left) Up with therapy Discharge to SNF likely Monday.      Kathryne Hitch 03/27/2023, 11:22 AM

## 2023-03-27 NOTE — Progress Notes (Signed)
Physical Therapy Treatment Patient Details Name: Alison Chandler MRN: [id number] DOB: Jan 04, 1941 Today's Date: 03/27/2023   History of Present Illness 82 yo female presents to therapy s/p L TKA on 03/26/2023 due to failure of conservative measures. Pt PMH includes but is not limited to: R tibial plateau fx, HTN, L ITB syndrome, meningioma, peripheral neuropathy, CT syndrome L wrist, hypothyroidism,parathyroid dysfunction, HOH, kidney stones, HLD, essential tremor, R THA (2013).    PT Comments  Pt very cooperative and progressing well with mobility.  Pt up to ambulate increased distance in hall, up to bathroom, and HEP initiated.     Assistance Recommended at Discharge Intermittent Supervision/Assistance  If plan is discharge home, recommend the following:  Can travel by private vehicle    A little help with walking and/or transfers;A little help with bathing/dressing/bathroom;Assistance with cooking/housework;Assist for transportation;Help with stairs or ramp for entrance      Equipment Recommendations  None recommended by PT    Recommendations for Other Services       Precautions / Restrictions Precautions Precautions: Knee;Fall Required Braces or Orthoses: Knee Immobilizer - Left Knee Immobilizer - Left: Discontinue once straight leg raise with < 10 degree lag (Pt performed IND SLR this am) Restrictions Weight Bearing Restrictions: No     Mobility  Bed Mobility               General bed mobility comments: Pt up in chair and requests back to same    Transfers Overall transfer level: Needs assistance Equipment used: Rolling walker (2 wheels) Transfers: Sit to/from Stand, Bed to chair/wheelchair/BSC Sit to Stand: Min guard   Step pivot transfers: Min guard       General transfer comment: cues for proper UE placement, pt expresses discomfort from IV site L hand.  Step pvt chair to [facility]    Ambulation/Gait Ambulation/Gait assistance: Min guard Gait Distance  (Feet): 38 Feet (twice) Assistive device: Rolling walker (2 wheels) Gait Pattern/deviations: Step-to pattern, Trunk flexed, Decreased stance time - left, Antalgic Gait velocity: decreased     General Gait Details: cues for sequence, posture and position from [employer]             Wheelchair Mobility     Tilt Bed    Modified Rankin (Stroke Patients Only)       Balance Overall balance assessment: Needs assistance Sitting-balance support: Feet supported Sitting balance-Leahy Scale: Good     Standing balance support: During functional activity, Reliant on assistive device for balance, Single extremity supported Standing balance-Leahy Scale: Poor                              Cognition Arousal/Alertness: Awake/alert Behavior During Therapy: WFL for tasks assessed/performed Overall Cognitive Status: Within Functional Limits for tasks assessed                                          Exercises Total Joint Exercises Ankle Circles/Pumps: AROM, 20 reps, Both Quad Sets: AROM, Both, 10 reps, Supine Heel Slides: AAROM, Left, 15 reps, Supine Straight Leg Raises: AAROM, AROM, Left, 15 reps, Supine Goniometric ROM: AAROM L knee -8 - 75    General Comments        Pertinent Vitals/Pain Pain Assessment Pain Assessment: 0-10 Pain Score: 7  Pain Location: L knee/leg Pain Descriptors / Indicators: Aching, Constant,  Discomfort, Operative site guarding Pain Intervention(s): Limited activity within patient's tolerance, Monitored during session, Premedicated before session, Ice applied    Home Living                          Prior Function            PT Goals (current goals can now be found in the care plan section) Acute Rehab PT Goals Patient Stated Goal: to be able to walk normally PT Goal Formulation: With patient Time For Goal Achievement: 04/16/23 Potential to Achieve Goals: Good Progress towards PT goals: Progressing  toward goals    Frequency    7X/week      PT Plan Current plan remains appropriate    Co-evaluation              AM-PAC PT "6 Clicks" Mobility   Outcome Measure  Help needed turning from your back to your side while in a flat bed without using bedrails?: A Little Help needed moving from lying on your back to sitting on the side of a flat bed without using bedrails?: A Little Help needed moving to and from a bed to a chair (including a wheelchair)?: A Little Help needed standing up from a chair using your arms (e.g., wheelchair or bedside chair)?: A Little Help needed to walk in hospital room?: A Little Help needed climbing 3-5 steps with a railing? : A Lot 6 Click Score: 17    End of Session Equipment Utilized During Treatment: Gait belt Activity Tolerance: Patient tolerated treatment well Patient left: in chair;with call bell/phone within reach Nurse Communication: Mobility status PT Visit Diagnosis: Unsteadiness on feet (R26.81);Other abnormalities of gait and mobility (R26.89);Muscle weakness (generalized) (M62.81);Difficulty in walking, not elsewhere classified (R26.2);Pain Pain - Right/Left: Left Pain - part of body: Knee;Leg     Time: 1610-9604 PT Time Calculation (min) (ACUTE ONLY): 44 min  Charges:    $Gait Training: 8-22 mins $Therapeutic Exercise: 8-22 mins $Therapeutic Activity: 8-22 mins PT General Charges $$ ACUTE PT VISIT: 1 Visit                     Mauro Kaufmann PT Acute Rehabilitation Services Pager [phone] Office [phone]    Vasili Fok 03/27/2023, 11:03 AM

## 2023-03-27 NOTE — Plan of Care (Signed)

## 2023-03-27 NOTE — Discharge Instructions (Signed)

## 2023-03-27 NOTE — Progress Notes (Signed)
Physical Therapy Treatment Patient Details Name: Alison Chandler MRN: [id number] DOB: [birth year] Today's Date: 03/27/2023   History of Present Illness 82 yo female presents to therapy s/p L TKA on 03/26/2023 due to failure of conservative measures. Pt PMH includes but is not limited to: R tibial plateau fx, HTN, L ITB syndrome, meningioma, peripheral neuropathy, CT syndrome L wrist, hypothyroidism,parathyroid dysfunction, HOH, kidney stones, HLD, essential tremor, R THA (2013).    PT Comments  Pt cooperative but limited this pm by increased pain and tolerated up to/from [facility] only.  Pt positioned for comfort and ice chest refilled.  Will follow up in am.     Assistance Recommended at Discharge Intermittent Supervision/Assistance  If plan is discharge home, recommend the following:  Can travel by private vehicle    A little help with walking and/or transfers;A little help with bathing/dressing/bathroom;Assistance with cooking/housework;Assist for transportation;Help with stairs or ramp for entrance      Equipment Recommendations  None recommended by PT    Recommendations for Other Services       Precautions / Restrictions Precautions Precautions: Knee;Fall Required Braces or Orthoses: Knee Immobilizer - Left Knee Immobilizer - Left: Discontinue once straight leg raise with < 10 degree lag Restrictions Weight Bearing Restrictions: No     Mobility  Bed Mobility               General bed mobility comments: Pt up in chair and requests back to same    Transfers Overall transfer level: Needs assistance Equipment used: Rolling walker (2 wheels) Transfers: Sit to/from Stand, Bed to chair/wheelchair/BSC Sit to Stand: Mod assist   Step pivot transfers: Min assist, Mod assist       General transfer comment: cues for proper UE placement, pt expresses discomfort from IV site L hand.  Step pvt chair<>BSC    Ambulation/Gait Ambulation/Gait assistance: Min guard Gait Distance  (Feet): 38 Feet (twice) Assistive device: Rolling walker (2 wheels) Gait Pattern/deviations: Step-to pattern, Trunk flexed, Decreased stance time - left, Antalgic Gait velocity: decreased     General Gait Details: tranfers to BSC only 2* pain   Stairs             Wheelchair Mobility     Tilt Bed    Modified Rankin (Stroke Patients Only)       Balance Overall balance assessment: Needs assistance Sitting-balance support: Feet supported Sitting balance-Leahy Scale: Good     Standing balance support: During functional activity, Reliant on assistive device for balance, Single extremity supported Standing balance-Leahy Scale: Poor                              Cognition Arousal/Alertness: Awake/alert Behavior During Therapy: WFL for tasks assessed/performed Overall Cognitive Status: Within Functional Limits for tasks assessed                                          Exercises Total Joint Exercises Ankle Circles/Pumps: AROM, 20 reps, Both Quad Sets: AROM, Both, 10 reps, Supine Heel Slides: AAROM, Left, 15 reps, Supine Straight Leg Raises: AAROM, AROM, Left, 15 reps, Supine Goniometric ROM: AAROM L knee -8 - 75    General Comments        Pertinent Vitals/Pain Pain Assessment Pain Assessment: 0-10 Pain Score: 8  Pain Location: L knee/leg Pain Descriptors / Indicators: Aching, Constant, Discomfort,  Operative site guarding Pain Intervention(s): Limited activity within patient's tolerance, Monitored during session, Premedicated before session, Ice applied    Home Living                          Prior Function            PT Goals (current goals can now be found in the care plan section) Acute Rehab PT Goals Patient Stated Goal: to be able to walk normally PT Goal Formulation: With patient Time For Goal Achievement: 04/16/23 Potential to Achieve Goals: Good Progress towards PT goals: Not progressing toward goals -  comment (limited by increased pain)    Frequency    7X/week      PT Plan Current plan remains appropriate    Co-evaluation              AM-PAC PT "6 Clicks" Mobility   Outcome Measure  Help needed turning from your back to your side while in a flat bed without using bedrails?: A Little Help needed moving from lying on your back to sitting on the side of a flat bed without using bedrails?: A Little Help needed moving to and from a bed to a chair (including a wheelchair)?: A Lot Help needed standing up from a chair using your arms (e.g., wheelchair or bedside chair)?: A Lot Help needed to walk in hospital room?: A Lot Help needed climbing 3-5 steps with a railing? : A Lot 6 Click Score: 14    End of Session Equipment Utilized During Treatment: Gait belt Activity Tolerance: Patient limited by fatigue;Patient limited by pain Patient left: in chair;with call bell/phone within reach Nurse Communication: Mobility status PT Visit Diagnosis: Unsteadiness on feet (R26.81);Other abnormalities of gait and mobility (R26.89);Muscle weakness (generalized) (M62.81);Difficulty in walking, not elsewhere classified (R26.2);Pain Pain - Right/Left: Left Pain - part of body: Knee;Leg     Time: 1436-1500 PT Time Calculation (min) (ACUTE ONLY): 24 min  Charges:    $Therapeutic Activity: 8-22 mins PT General Charges $$ ACUTE PT VISIT: 1 Visit                     Mauro Kaufmann PT Acute Rehabilitation Services Pager [phone] Office [phone]    Eryanna Regal 03/27/2023, 3:09 PM

## 2023-03-28 ENCOUNTER — Encounter (HOSPITAL_COMMUNITY): Payer: Self-pay | Admitting: Orthopaedic Surgery

## 2023-03-28 NOTE — Progress Notes (Signed)
Pt reports taking ativan at home PRN for anxiety. Pts husband placed small white pill in RNs hand and reports pt was told to take 1 ativan prior to surgery if needed so pt had 1 white pill identified as ativan in pants pocket. This RN and night RN, Salli Quarry, RN wasted pt supplied ativan pill in med waste container in med room due to pill being placed in nurses hand without gloves and pt cannot have home meds. No need to send to pharmacy due to being 1 pill and handles by several people including pt, husband and RN.

## 2023-03-28 NOTE — Progress Notes (Signed)
Provider, Dr. Steward Drone, contacted via message by this RN to request ativan order for pt. This RN added night shift RN into conversation to be able to continue conversation with provider as this RNs shift has ended.

## 2023-03-28 NOTE — Plan of Care (Signed)
  Problem: Pain Management: Goal: Pain level will decrease with appropriate interventions Outcome: Progressing   Problem: Coping: Goal: Level of anxiety will decrease Outcome: Progressing   

## 2023-03-28 NOTE — Progress Notes (Signed)
Physical Therapy Treatment Patient Details Name: Alison Chandler MRN: [id number] DOB: 1940/10/16 Today's Date: 03/28/2023   History of Present Illness 82 yo female presents to therapy s/p L TKA on 03/26/2023 due to failure of conservative measures. Pt PMH includes but is not limited to: R tibial plateau fx, HTN, L ITB syndrome, meningioma, peripheral neuropathy, CT syndrome L wrist, hypothyroidism,parathyroid dysfunction, HOH, kidney stones, HLD, essential tremor, R THA (2013).    PT Comments  Pt continues very cooperative but struggling to progress this am 2* increased pain and fatigue level.  Will follow up in pm.     Assistance Recommended at Discharge Intermittent Supervision/Assistance  If plan is discharge home, recommend the following:  Can travel by private vehicle    A little help with walking and/or transfers;A little help with bathing/dressing/bathroom;Assistance with cooking/housework;Assist for transportation;Help with stairs or ramp for entrance      Equipment Recommendations  None recommended by PT    Recommendations for Other Services       Precautions / Restrictions Precautions Precautions: Knee;Fall Required Braces or Orthoses: Knee Immobilizer - Left Knee Immobilizer - Left: Discontinue once straight leg raise with < 10 degree lag Restrictions Weight Bearing Restrictions: No LLE Weight Bearing: Weight bearing as tolerated     Mobility  Bed Mobility               General bed mobility comments: Pt up in chair and requests back to same    Transfers Overall transfer level: Needs assistance Equipment used: Rolling walker (2 wheels) Transfers: Sit to/from Stand Sit to Stand: Min assist, Mod assist           General transfer comment: cues for LE management and use of UEs to self assist;  Physical assist to bring wt up and fwd and to balance in standing with RW    Ambulation/Gait Ambulation/Gait assistance: Min assist, Min guard Gait Distance  (Feet): 28 Feet Assistive device: Rolling walker (2 wheels) Gait Pattern/deviations: Step-to pattern, Trunk flexed, Decreased stance time - left, Antalgic       General Gait Details: cues for sequence, posture and position from RW; distance ltd by fatigue   Stairs             Wheelchair Mobility     Tilt Bed    Modified Rankin (Stroke Patients Only)       Balance Overall balance assessment: Needs assistance Sitting-balance support: Feet supported Sitting balance-Leahy Scale: Good     Standing balance support: During functional activity, Reliant on assistive device for balance, Single extremity supported Standing balance-Leahy Scale: Poor                              Cognition Arousal/Alertness: Awake/alert Behavior During Therapy: WFL for tasks assessed/performed Overall Cognitive Status: Within Functional Limits for tasks assessed                                          Exercises Total Joint Exercises Ankle Circles/Pumps: AROM, 20 reps, Both Quad Sets: AROM, Both, 10 reps, Supine Heel Slides: AAROM, Left, 15 reps, Supine Straight Leg Raises: AAROM, AROM, Left, 15 reps, Supine Goniometric ROM: AAROM L knee -6 - 50 pain limited    General Comments        Pertinent Vitals/Pain Pain Assessment Pain Assessment: 0-10 Pain Score: 8  Pain Location: L knee/leg Pain Descriptors / Indicators: Aching, Constant, Discomfort, Operative site guarding Pain Intervention(s): Limited activity within patient's tolerance, Monitored during session, Premedicated before session, Ice applied    Home Living                          Prior Function            PT Goals (current goals can now be found in the care plan section) Acute Rehab PT Goals Patient Stated Goal: to be able to walk normally PT Goal Formulation: With patient Time For Goal Achievement: 04/16/23 Potential to Achieve Goals: Good Progress towards PT goals:  Progressing toward goals    Frequency    7X/week      PT Plan Current plan remains appropriate    Co-evaluation              AM-PAC PT "6 Clicks" Mobility   Outcome Measure  Help needed turning from your back to your side while in a flat bed without using bedrails?: A Little Help needed moving from lying on your back to sitting on the side of a flat bed without using bedrails?: A Little Help needed moving to and from a bed to a chair (including a wheelchair)?: A Lot Help needed standing up from a chair using your arms (e.g., wheelchair or bedside chair)?: A Lot Help needed to walk in hospital room?: A Little Help needed climbing 3-5 steps with a railing? : A Lot 6 Click Score: 15    End of Session Equipment Utilized During Treatment: Gait belt Activity Tolerance: Patient limited by fatigue;Patient limited by pain Patient left: in chair;with call bell/phone within reach Nurse Communication: Mobility status PT Visit Diagnosis: Unsteadiness on feet (R26.81);Other abnormalities of gait and mobility (R26.89);Muscle weakness (generalized) (M62.81);Difficulty in walking, not elsewhere classified (R26.2);Pain Pain - Right/Left: Left Pain - part of body: Knee;Leg     Time: 1040-1110 PT Time Calculation (min) (ACUTE ONLY): 30 min  Charges:    $Gait Training: 8-22 mins $Therapeutic Exercise: 8-22 mins PT General Charges $$ ACUTE PT VISIT: 1 Visit                     Mauro Kaufmann PT Acute Rehabilitation Services Pager [phone] Office [phone]    Dimitriy Carreras 03/28/2023, 12:22 PM

## 2023-03-28 NOTE — Progress Notes (Signed)
Physical Therapy Treatment Patient Details Name: Alison Chandler MRN: [id number] DOB: Oct 13, 1940 Today's Date: 03/28/2023   History of Present Illness 82 yo female presents to therapy s/p L TKA on 03/26/2023 due to failure of conservative measures. Pt PMH includes but is not limited to: R tibial plateau fx, HTN, L ITB syndrome, meningioma, peripheral neuropathy, CT syndrome L wrist, hypothyroidism,parathyroid dysfunction, HOH, kidney stones, HLD, essential tremor, R THA (2013).    PT Comments  Pt continues cooperative but progressing slowly with mobility and struggling with transitions sit>stand.  Pt hopeful for dc to [facility].     Assistance Recommended at Discharge Intermittent Supervision/Assistance  If plan is discharge home, recommend the following:  Can travel by private vehicle    A little help with walking and/or transfers;A little help with bathing/dressing/bathroom;Assistance with cooking/housework;Assist for transportation;Help with stairs or ramp for entrance      Equipment Recommendations  None recommended by PT    Recommendations for Other Services       Precautions / Restrictions Precautions Precautions: Knee;Fall Required Braces or Orthoses: Knee Immobilizer - Left Knee Immobilizer - Left: Discontinue once straight leg raise with < 10 degree lag Restrictions LLE Weight Bearing: Weight bearing as tolerated     Mobility  Bed Mobility               General bed mobility comments: Pt up in chair and requests back to same    Transfers Overall transfer level: Needs assistance Equipment used: Rolling walker (2 wheels) Transfers: Sit to/from Stand Sit to Stand: Min assist, Mod assist           General transfer comment: cues for LE management and use of UEs to self assist;  Physical assist to bring wt up and fwd and to balance in standing with RW    Ambulation/Gait Ambulation/Gait assistance: Min assist Gait Distance (Feet): 46  Feet Assistive device: Rolling walker (2 wheels) Gait Pattern/deviations: Step-to pattern, Trunk flexed, Decreased stance time - left, Antalgic Gait velocity: decreased     General Gait Details: cues for sequence, posture and position from RW; distance ltd by fatigue   Stairs             Wheelchair Mobility     Tilt Bed    Modified Rankin (Stroke Patients Only)       Balance Overall balance assessment: Needs assistance Sitting-balance support: Feet supported Sitting balance-Leahy Scale: Good     Standing balance support: During functional activity, Reliant on assistive device for balance, Single extremity supported Standing balance-Leahy Scale: Poor                              Cognition Arousal/Alertness: Awake/alert Behavior During Therapy: WFL for tasks assessed/performed Overall Cognitive Status: Within Functional Limits for tasks assessed                                          Exercises Total Joint Exercises Ankle Circles/Pumps: AROM, 20 reps, Both Quad Sets: AROM, Both, 10 reps, Supine Heel Slides: AAROM, Left, 15 reps, Supine Straight Leg Raises: AAROM, AROM, Left, 15 reps, Supine Goniometric ROM: AAROM L knee -6 - 50 pain limited    General Comments        Pertinent Vitals/Pain Pain Assessment Pain Assessment: 0-10 Pain Score: 6  Pain Location: L knee/leg Pain  Descriptors / Indicators: Aching, Constant, Discomfort, Operative site guarding Pain Intervention(s): Limited activity within patient's tolerance, Monitored during session, Premedicated before session, Ice applied    Home Living                          Prior Function            PT Goals (current goals can now be found in the care plan section) Acute Rehab PT Goals Patient Stated Goal: to be able to walk normally PT Goal Formulation: With patient Time For Goal Achievement: 04/16/23 Potential to Achieve Goals: Good Progress towards PT  goals: Progressing toward goals    Frequency    7X/week      PT Plan Current plan remains appropriate    Co-evaluation              AM-PAC PT "6 Clicks" Mobility   Outcome Measure  Help needed turning from your back to your side while in a flat bed without using bedrails?: A Little Help needed moving from lying on your back to sitting on the side of a flat bed without using bedrails?: A Little Help needed moving to and from a bed to a chair (including a wheelchair)?: A Lot Help needed standing up from a chair using your arms (e.g., wheelchair or bedside chair)?: A Lot Help needed to walk in hospital room?: A Little Help needed climbing 3-5 steps with a railing? : A Lot 6 Click Score: 15    End of Session Equipment Utilized During Treatment: Gait belt Activity Tolerance: Patient limited by fatigue;Patient limited by pain Patient left: in chair;with call bell/phone within reach Nurse Communication: Mobility status PT Visit Diagnosis: Unsteadiness on feet (R26.81);Other abnormalities of gait and mobility (R26.89);Muscle weakness (generalized) (M62.81);Difficulty in walking, not elsewhere classified (R26.2);Pain Pain - Right/Left: Left Pain - part of body: Knee;Leg     Time: 1610-9604 PT Time Calculation (min) (ACUTE ONLY): 25 min  Charges:    $Gait Training: 23-37 mins PT General Charges $$ ACUTE PT VISIT: 1 Visit                     Mauro Kaufmann PT Acute Rehabilitation Services Pager [phone] Office [phone]    Christopher Hink 03/28/2023, 4:21 PM

## 2023-03-28 NOTE — Plan of Care (Signed)
  Problem: Coping: Goal: Level of anxiety will decrease Outcome: Progressing   Problem: Activity: Goal: Risk for activity intolerance will decrease Outcome: Progressing   Problem: Pain Management: Goal: Pain level will decrease with appropriate interventions Outcome: Progressing

## 2023-03-28 NOTE — Progress Notes (Signed)
Subjective: 2 Days Post-Op Procedure(s) (LRB): LEFT TOTAL KNEE ARTHROPLASTY (Left) Patient reports pain as moderate.  No issues overnight. Pending DC to rehab.  Objective: Vital signs in last 24 hours: Temp:  [97.4 F (36.3 C)-98.8 F (37.1 C)] 98.8 F (37.1 C) (07/28 0510) Pulse Rate:  [51-74] 74 (07/28 0510) Resp:  [15-18] 18 (07/28 0510) BP: (118-159)/(70-89) 157/76 (07/28 0510) SpO2:  [95 %-100 %] 95 % (07/28 0510)  Intake/Output from previous day: 07/27 0701 - 07/28 0700 In: 840 [P.O.:840] Out: 50 [Urine:50] Intake/Output this shift: No intake/output data recorded.  Recent Labs    03/27/23 0343  HGB 12.2   Recent Labs    03/27/23 0343  WBC 12.5*  RBC 3.98  HCT 37.4  PLT 244   Recent Labs    03/27/23 0343  NA 137  K 3.9  CL 101  CO2 26  BUN 17  CREATININE 0.97  GLUCOSE 160*  CALCIUM 9.0   No results for input(s): "LABPT", "INR" in the last 72 hours.  Sensation intact distally Intact pulses distally Dorsiflexion/Plantar flexion intact Incision: dressing C/D/I No cellulitis present Compartment soft   Assessment/Plan: 2 Days Post-Op Procedure(s) (LRB): LEFT TOTAL KNEE ARTHROPLASTY (Left) Up with therapy Discharge to SNF likely Monday.      Loretta Kluender 03/28/2023, 7:42 AM

## 2023-03-29 MED ORDER — LORAZEPAM 0.5 MG PO TABS
0.5000 mg | ORAL_TABLET | Freq: Two times a day (BID) | ORAL | Status: DC | PRN
  Administered 2023-03-30: 0.5 mg via ORAL
  Filled 2023-03-29: qty 1

## 2023-03-29 MED ORDER — LORAZEPAM 1 MG PO TABS: 1.0000 mg | ORAL_TABLET | Freq: Two times a day (BID) | ORAL | Status: DC | PRN

## 2023-03-29 MED ORDER — ASPIRIN 81 MG PO CHEW: 81.0000 mg | CHEWABLE_TABLET | Freq: Two times a day (BID) | ORAL | 0 refills | Status: AC

## 2023-03-29 MED ORDER — OXYCODONE HCL 5 MG PO TABS: 5.0000 mg | ORAL_TABLET | Freq: Four times a day (QID) | ORAL | 0 refills | Status: DC | PRN

## 2023-03-29 NOTE — Progress Notes (Signed)
Physical Therapy Treatment Patient Details Name: Alison Chandler MRN: [id number] DOB: February 14, 1941 Today's Date: 03/29/2023   History of Present Illness 82 yo female presents to therapy s/p L TKA on 03/26/2023 due to failure of conservative measures. Pt PMH includes but is not limited to: R tibial plateau fx, HTN, L ITB syndrome, meningioma, peripheral neuropathy, CT syndrome L wrist, hypothyroidism,parathyroid dysfunction, HOH, kidney stones, HLD, essential tremor, R THA (2013).    PT Comments  Pt progressing toward goals however continues to require heavy mod assist for transfers and fatigue limits amb distance.  Patient will benefit from continued follow up therapy, <3 hours/day at d/c.     If plan is discharge home, recommend the following: A little help with bathing/dressing/bathroom;Assistance with cooking/housework;Assist for transportation;Help with stairs or ramp for entrance;A lot of help with walking and/or transfers   Can travel by private vehicle     No  Equipment Recommendations  None recommended by PT    Recommendations for Other Services       Precautions / Restrictions Precautions Precautions: Knee;Fall Precaution Booklet Issued: No Required Braces or Orthoses: Knee Immobilizer - Left Knee Immobilizer - Left: Discontinue once straight leg raise with < 10 degree lag Restrictions Weight Bearing Restrictions: No LLE Weight Bearing: Weight bearing as tolerated     Mobility  Bed Mobility               General bed mobility comments: Pt up in chair and requests back to same    Transfers Overall transfer level: Needs assistance Equipment used: Rolling walker (2 wheels) Transfers: Sit to/from Stand Sit to Stand: Mod assist           General transfer comment: cues for LE management and use of UEs to self assist; 2 attempts to come to stand. heavy mod assist to bring wt up and fwd and to balance and transition to RW.    Ambulation/Gait Ambulation/Gait  assistance: Min assist Gait Distance (Feet): 25 Feet Assistive device: Rolling walker (2 wheels) Gait Pattern/deviations: Step-to pattern, Trunk flexed, Decreased stance time - left, Antalgic, Decreased step length - right, Decreased step length - left Gait velocity: decreased     General Gait Details: cues for sequence, posture and position from RW; distance ltd by fatigue   Stairs             Wheelchair Mobility     Tilt Bed    Modified Rankin (Stroke Patients Only)       Balance Overall balance assessment: Needs assistance Sitting-balance support: Feet supported Sitting balance-Leahy Scale: Good     Standing balance support: During functional activity, Reliant on assistive device for balance, Single extremity supported Standing balance-Leahy Scale: Poor Standing balance comment: reliant on UEs and external assist                            Cognition Arousal/Alertness: Awake/alert Behavior During Therapy: WFL for tasks assessed/performed Overall Cognitive Status: Within Functional Limits for tasks assessed                                          Exercises Total Joint Exercises Quad Sets: AROM, Both, 5 reps    General Comments        Pertinent Vitals/Pain Pain Assessment Pain Assessment: 0-10 Pain Score: 6  Pain Location: L knee/leg Pain Descriptors /  Indicators: Aching, Constant, Discomfort, Operative site guarding Pain Intervention(s): Limited activity within patient's tolerance, Monitored during session, Repositioned, Premedicated before session (declined ice for now)    Home Living                          Prior Function            PT Goals (current goals can now be found in the care plan section) Acute Rehab PT Goals Patient Stated Goal: to be able to walk normally PT Goal Formulation: With patient Time For Goal Achievement: 04/16/23 Potential to Achieve Goals: Good Progress towards PT goals:  Progressing toward goals    Frequency    7X/week      PT Plan Current plan remains appropriate    Co-evaluation              AM-PAC PT "6 Clicks" Mobility   Outcome Measure  Help needed turning from your back to your side while in a flat bed without using bedrails?: A Little Help needed moving from lying on your back to sitting on the side of a flat bed without using bedrails?: A Little Help needed moving to and from a bed to a chair (including a wheelchair)?: A Lot Help needed standing up from a chair using your arms (e.g., wheelchair or bedside chair)?: A Lot Help needed to walk in hospital room?: A Little Help needed climbing 3-5 steps with a railing? : A Lot 6 Click Score: 15    End of Session Equipment Utilized During Treatment: Gait belt Activity Tolerance: Patient limited by fatigue Patient left: in chair;with call bell/phone within reach;with chair alarm set   PT Visit Diagnosis: Unsteadiness on feet (R26.81);Other abnormalities of gait and mobility (R26.89);Muscle weakness (generalized) (M62.81);Difficulty in walking, not elsewhere classified (R26.2);Pain Pain - Right/Left: Left Pain - part of body: Knee;Leg     Time: 9528-4132 PT Time Calculation (min) (ACUTE ONLY): 22 min  Charges:    $Gait Training: 8-22 mins PT General Charges $$ ACUTE PT VISIT: 1 Visit                     [place] Gamero, PT  Acute Rehab Dept [facility]) [phone]  03/29/2023    [facility] 03/29/2023, 11:49 AM

## 2023-03-29 NOTE — Progress Notes (Signed)
PT TX NOTE- pm session  03/29/23 1400  PT Visit Information  Last PT Received On 03/29/23  Assistance Needed +1 (+1xfers/ +2 for safe progression)  Pt motivated and cooperative, agreeable to PT. Will benefit from continued PT at d/c Patient will benefit from continued inpatient follow up therapy, <3 hours/day . Continue PT in acute setting.  History of Present Illness 82 yo female presents to therapy s/p L TKA on 03/26/2023 due to failure of conservative measures. Pt PMH includes but is not limited to: R tibial plateau fx, HTN, L ITB syndrome, meningioma, peripheral neuropathy, CT syndrome L wrist, hypothyroidism,parathyroid dysfunction, HOH, kidney stones, HLD, essential tremor, R THA (2013).  Subjective Data  Patient Stated Goal to be able to walk normally  Precautions  Precautions Knee;Fall  Precaution Booklet Issued No  Required Braces or Orthoses Knee Immobilizer - Left  Knee Immobilizer - Left Discontinue once straight leg raise with < 10 degree lag  Restrictions  LLE Weight Bearing WBAT  Pain Assessment  Pain Assessment 0-10  Pain Score 6  Pain Location L knee/leg  Pain Descriptors / Indicators Aching;Constant;Discomfort;Operative site guarding  Pain Intervention(s) Limited activity within patient's tolerance;Monitored during session;Premedicated before session;Ice applied  Cognition  Arousal/Alertness Awake/alert  Behavior During Therapy WFL for tasks assessed/performed  Overall Cognitive Status Within Functional Limits for tasks assessed  Bed Mobility  General bed mobility comments Pt up in chair and requests back to same  Transfers  Overall transfer level Needs assistance  Equipment used Rolling walker (2 wheels)  Transfers Sit to/from Stand  Sit to Stand Mod assist;Min assist  General transfer comment cues for LE management and use of UEs to self assist; 2 attempts to come to stand. min to  mod assist to bring wt up and fwd, balance and transition to RW.   Ambulation/Gait  Ambulation/Gait assistance Min assist  Gait Distance (Feet) 18 Feet  Assistive device Rolling walker (2 wheels)  Gait Pattern/deviations Step-to pattern;Trunk flexed;Decreased stance time - left;Decreased step length - right;Decreased step length - left  General Gait Details cues for sequence, posture and position from RW; distance ltd by fatigue  Gait velocity decreased  Balance  Overall balance assessment Needs assistance  Sitting-balance support Feet supported  Sitting balance-Leahy Scale Good  Standing balance support During functional activity;Reliant on assistive device for balance;Single extremity supported  Standing balance-Leahy Scale Poor  Standing balance comment reliant on UEs and external assist  Total Joint Exercises  Ankle Circles/Pumps AROM;Both;10 reps  Quad Sets AROM;Both;10 reps  Heel Slides AAROM;Left;10 reps  PT - End of Session  Equipment Utilized During Treatment Gait belt  Activity Tolerance Patient limited by fatigue  Patient left in chair;with call bell/phone within reach;with chair alarm set   PT - Assessment/Plan  PT Plan Current plan remains appropriate  PT Visit Diagnosis Unsteadiness on feet (R26.81);Other abnormalities of gait and mobility (R26.89);Muscle weakness (generalized) (M62.81);Difficulty in walking, not elsewhere classified (R26.2);Pain  Pain - Right/Left Left  Pain - part of body Knee;Leg  PT Frequency (ACUTE ONLY) 7X/week  Follow Up Recommendations Skilled nursing-short term rehab (<3 hours/day)  Can patient physically be transported by private vehicle No  Assistance recommended at discharge Intermittent Supervision/Assistance  Patient can return home with the following A little help with bathing/dressing/bathroom;Assistance with cooking/housework;Assist for transportation;Help with stairs or ramp for entrance;A lot of help with walking and/or transfers  PT equipment None recommended by PT  AM-PAC PT "6 Clicks" Mobility  Outcome Measure (Version 2)  Help needed turning from  your back to your side while in a flat bed without using bedrails? 3  Help needed moving from lying on your back to sitting on the side of a flat bed without using bedrails? 3  Help needed moving to and from a bed to a chair (including a wheelchair)? 2  Help needed standing up from a chair using your arms (e.g., wheelchair or bedside chair)? 2  Help needed to walk in hospital room? 3  Help needed climbing 3-5 steps with a railing?  2  6 Click Score 15  Consider Recommendation of Discharge To: CIR/SNF/LTACH  PT Goal Progression  Progress towards PT goals Progressing toward goals  Acute Rehab PT Goals  PT Goal Formulation With patient  Time For Goal Achievement 04/16/23  Potential to Achieve Goals Good  PT Time Calculation  PT Start Time (ACUTE ONLY) 1423  PT Stop Time (ACUTE ONLY) 1447  PT Time Calculation (min) (ACUTE ONLY) 24 min  PT General Charges  $$ ACUTE PT VISIT 1 Visit  PT Treatments  $Therapeutic Exercise 8-22 mins

## 2023-03-29 NOTE — Progress Notes (Signed)
Patient ID: Alison Chandler, female   DOB: April 21, 1941, 82 y.o.   MRN: [id number] The patient is awake and alert.  She reports normal pain as it relates to her left knee replacement.  There has been no acute change in her medical status.  She is slowly mobilizing with physical therapy.  On Friday when she was admitted, she did let me know that she would prefer to go to [facility] short-term skilled nursing which is reasonable given her age of 25 and her mobility.  I did put in the order on Friday for the transitional care team to see the patient for this and there are no notes from transitional care.  The patient's vital signs are stable and her left operative knee is stable.  If it can be arranged for today, the patient can be discharged to [facility].

## 2023-03-29 NOTE — NC FL2 (Signed)
[place] MEDICAID FL2 LEVEL OF CARE FORM     IDENTIFICATION  Patient Name: Alison Chandler Birthdate: 02-15-1941 Sex: female Admission Date (Current Location): 03/26/2023  [facility] and [place] Number:  Producer, television/film/video and Address:  [facility],  [address] [place], [place] [address]      Provider Number: [id number]  Attending Physician Name and Address:  Kathryne Hitch  Relative Name and Phone Number:  spouse, Gerald Socorro @ [phone]    Current Level of Care: Hospital Recommended Level of Care: Skilled Nursing Facility Prior Approval Number:    Date Approved/Denied:   PASRR Number: [id number]  Discharge Plan: SNF    Current Diagnoses: Patient Active Problem List   Diagnosis Date Noted   Status post total left knee replacement 03/26/2023   Fracture of right tibial plateau 07/15/2021   Closed fracture of right tibial plateau, initial encounter 07/15/2021   Unilateral primary osteoarthritis, left knee 12/19/2019   Benign essential hypertension 10/30/2019   Hypercalcemia 10/30/2019   Acute medial meniscal tear, right, initial encounter 08/16/2019   Iatrogenic hypocalcemia 06/05/2019   Primary hyperparathyroidism (HCC)    Nuclear sclerotic cataract of both eyes 10/05/2018   RPE mottling of macula 10/05/2018   Flat foot 06/20/2018   Tibialis posterior tendonitis, left 06/20/2018   Acute pain of right shoulder 05/04/2018   Chronic pain of left knee 12/22/2017   Subjective memory complaints 10/08/2017   It band syndrome, left 08/11/2017   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HCC) 03/23/2017   Meningioma (HCC) 03/23/2017   Idiopathic peripheral neuropathy 03/11/2017   Imbalance 03/11/2017   Paresthesias 12/06/2016   Tremor 12/06/2016   Age-related osteoporosis without current pathological fracture 12/03/2016   Nephrolithiasis 12/03/2016   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/21/2016   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   Hypertropia of left eye 09/05/2014    Intermittent exotropia of left eye 09/05/2014   Midline cystocele 05/10/2014   Stress bladder incontinence, female 05/10/2014   Benign neoplasm of colon 04/16/2014   Calculus of ureter 04/16/2014   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   Esophageal reflux 04/16/2014   Gross hematuria 04/16/2014   Headache 04/16/2014   Hypothyroidism 04/16/2014   Increased urinary frequency 04/16/2014   Keratoconjunctivitis sicca, 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 04/16/2014   Osteopenia, 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   Other seborrheic keratosis 04/16/2014   Other synovitis and tenosynovitis 04/16/2014   Presbycusis 04/16/2014   Primary localized osteoarthrosis, pelvic region and thigh 04/16/2014   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   Strabismus 04/16/2014   Urethral stenosis 04/16/2014   Urinary tract infection 04/16/2014   Allergic rhinitis 04/16/2014   Degenerative arthritis of hip 02/19/2012    Orientation RESPIRATION BLADDER Height & Weight     Self, Time, Situation, Place  Normal Continent Weight: 230 lb (104.3 kg) Height:  5\' 8"  (172.7 cm)  BEHAVIORAL SYMPTOMS/MOOD NEUROLOGICAL BOWEL NUTRITION STATUS      Continent Diet (regular)  AMBULATORY STATUS COMMUNICATION OF NEEDS Skin   Limited Assist Verbally Other (Comment) (surgical incision only)                       Personal Care Assistance Level of Assistance  Bathing, Dressing Bathing Assistance: Limited assistance   Dressing Assistance: Limited assistance     Functional Limitations Info  Sight, Hearing, Speech Sight Info: Adequate Hearing Info: Adequate Speech Info: Adequate    SPECIAL CARE FACTORS FREQUENCY  PT (By licensed PT), OT (By licensed OT)     PT Frequency: 5x/wk  OT Frequency: 5x/wk            Contractures Contractures Info: Not present    Additional Factors Info  Code Status, Allergies Code Status Info: Full Allergies Info: Actonel (Risedronate Sodium), Amlodipine           Current Medications  (03/29/2023):  This is the current hospital active medication list Current Facility-Administered Medications  Medication Dose Route Frequency Provider Last Rate Last Admin   0.9 %  sodium chloride infusion   Intravenous Continuous Kathryne Hitch, MD   Stopped at 03/26/23 1849   acetaminophen (TYLENOL) tablet 325-650 mg  325-650 mg Oral Q6H PRN Kathryne Hitch, MD   650 mg at 03/28/23 2205   alum & mag hydroxide-simeth (MAALOX/MYLANTA) 200-200-20 MG/5ML suspension 30 mL  30 mL Oral Q4H PRN Kathryne Hitch, MD       aspirin chewable tablet 81 mg  81 mg Oral BID Kathryne Hitch, MD   81 mg at 03/29/23 1053   atenolol (TENORMIN) tablet 50 mg  50 mg Oral Daily Kathryne Hitch, MD   50 mg at 03/29/23 1053   And   chlorthalidone (HYGROTON) tablet 25 mg  25 mg Oral Daily Kathryne Hitch, MD   25 mg at 03/29/23 1053   cholecalciferol (VITAMIN D3) 25 MCG (1000 UNIT) tablet 5,000 Units  5,000 Units Oral QPM Kathryne Hitch, MD   5,000 Units at 03/28/23 1846   diphenhydrAMINE (BENADRYL) 12.5 MG/5ML elixir 12.5-25 mg  12.5-25 mg Oral Q4H PRN Kathryne Hitch, MD   25 mg at 03/29/23 0451   docusate sodium (COLACE) capsule 100 mg  100 mg Oral BID Kathryne Hitch, MD   100 mg at 03/29/23 1054   HYDROmorphone (DILAUDID) injection 0.5-1 mg  0.5-1 mg Intravenous Q4H PRN Kathryne Hitch, MD       menthol-cetylpyridinium (CEPACOL) lozenge 3 mg  1 lozenge Oral PRN Kathryne Hitch, MD       Or   phenol (CHLORASEPTIC) mouth spray 1 spray  1 spray Mouth/Throat PRN Kathryne Hitch, MD       methocarbamol (ROBAXIN) tablet 500 mg  500 mg Oral Q6H PRN Kathryne Hitch, MD   500 mg at 03/29/23 0451   Or   methocarbamol (ROBAXIN) 500 mg in dextrose 5 % 50 mL IVPB  500 mg Intravenous Q6H PRN Kathryne Hitch, MD   Stopped at 03/26/23 1724   metoCLOPramide (REGLAN) tablet 5-10 mg  5-10 mg Oral Q8H PRN Kathryne Hitch, MD       Or   metoCLOPramide (REGLAN) injection 5-10 mg  5-10 mg Intravenous Q8H PRN Kathryne Hitch, MD       ondansetron [facility]) tablet 4 mg  4 mg Oral Q6H PRN Kathryne Hitch, MD       Or   ondansetron [facility]) injection 4 mg  4 mg Intravenous Q6H PRN Kathryne Hitch, MD       oxyCODONE (Oxy IR/ROXICODONE) immediate release tablet 10-15 mg  10-15 mg Oral Q4H PRN Kathryne Hitch, MD   10 mg at 03/29/23 4259   oxyCODONE (Oxy IR/ROXICODONE) immediate release tablet 5-10 mg  5-10 mg Oral Q4H PRN Kathryne Hitch, MD   10 mg at 03/28/23 0456   pantoprazole (PROTONIX) EC tablet 40 mg  40 mg Oral Daily Kathryne Hitch, MD   40 mg at 03/29/23 1054   polyethylene glycol (MIRALAX / GLYCOLAX) packet 17 g  17 g  Oral Daily PRN Kathryne Hitch, MD       rosuvastatin (CRESTOR) tablet 20 mg  20 mg Oral Daily Kathryne Hitch, MD   20 mg at 03/29/23 1054     Discharge Medications: Please see discharge summary for a list of discharge medications.  Relevant Imaging Results:  Relevant Lab Results:   Additional Information SSN: [id number]  Amada Jupiter, LCSW

## 2023-03-29 NOTE — Discharge Summary (Signed)
Patient ID: Alison Chandler MRN: [id number] DOB/AGE: 1941-03-24 82 y.o.  Admit date: 03/26/2023 Discharge date: 03/29/2023  Admission Diagnoses:  Principal Problem:   Unilateral primary osteoarthritis, left knee Active Problems:   Status post total left knee replacement   Discharge Diagnoses:  Same  Past Medical History:  Diagnosis Date   Arthritis    Hearing loss    History of kidney stones    Hyperlipidemia    Hypertension    Neuromuscular disorder (HCC)    neuropathy legs no medicine   Parathyroid dysfunction (HCC)    one Quit working was removed   Tremor, essential     Surgeries: Procedure(s): LEFT TOTAL KNEE ARTHROPLASTY on 03/26/2023   Consultants:   Discharged Condition: Improved  Hospital Course: Alison Chandler is an 82 y.o. female who was admitted 03/26/2023 for operative treatment ofUnilateral primary osteoarthritis, left knee. Patient has severe unremitting pain that affects sleep, daily activities, and work/hobbies. After pre-op clearance the patient was taken to the operating room on 03/26/2023 and underwent  Procedure(s): LEFT TOTAL KNEE ARTHROPLASTY.    Patient was given perioperative antibiotics:  Anti-infectives (From admission, onward)    Start     Dose/Rate Route Frequency Ordered Stop   03/26/23 1845  ceFAZolin (ANCEF) IVPB 1 g/50 mL premix        1 g 100 mL/hr over 30 Minutes Intravenous Every 6 hours 03/26/23 1758 03/27/23 0156   03/26/23 1115  ceFAZolin (ANCEF) IVPB 2g/100 mL premix        2 g 200 mL/hr over 30 Minutes Intravenous On call to O.R. 03/26/23 1102 03/26/23 1331        Patient was given sequential compression devices, early ambulation, and chemoprophylaxis to prevent DVT.  Patient benefited maximally from hospital stay and there were no complications.    Recent vital signs: Patient Vitals for the past 24 hrs:  BP Temp Temp src Pulse Resp SpO2  03/29/23 0440 (!) 155/82 97.7 F (36.5 C) Oral 67 16 99 %  03/28/23 2101 (!) 150/76  97.9 F (36.6 C) Oral 64 15 97 %  03/28/23 1424 (!) 155/89 98.4 F (36.9 C) -- 64 17 100 %  03/28/23 0953 (!) 152/79 98 F (36.7 C) Oral 79 18 99 %     Recent laboratory studies:  Recent Labs    03/27/23 0343  WBC 12.5*  HGB 12.2  HCT 37.4  PLT 244  NA 137  K 3.9  CL 101  CO2 26  BUN 17  CREATININE 0.97  GLUCOSE 160*  CALCIUM 9.0     Discharge Medications:   Allergies as of 03/29/2023       Reactions   Actonel [risedronate Sodium] Nausea Only   Amlodipine Nausea Only        Medication List     STOP taking these medications    naproxen sodium 220 MG tablet Commonly known as: ALEVE   traMADol 50 MG tablet Commonly known as: ULTRAM       TAKE these medications    acetaminophen 500 MG tablet Commonly known as: TYLENOL Take 500 mg by mouth in the morning and at bedtime.   aspirin 81 MG chewable tablet Chew 1 tablet (81 mg total) by mouth 2 (two) times daily.   atenolol-chlorthalidone 50-25 MG tablet Commonly known as: TENORETIC Take 1 tablet by mouth daily.   CALCIUM PO Take 1 tablet by mouth daily.   Dialyvite Vitamin D 5000 125 MCG (5000 UT) capsule Generic drug: Cholecalciferol Take 5,000  Units by mouth every evening.   Fish Oil 1200 MG Caps Take 1,200 mg by mouth every evening.   GLUCOSAMINE-CHONDROITIN PO Take 1 tablet by mouth every evening.   oxyCODONE 5 MG immediate release tablet Commonly known as: Oxy IR/ROXICODONE Take 1-2 tablets (5-10 mg total) by mouth every 6 (six) hours as needed for moderate pain (pain score 4-6).   rosuvastatin 20 MG tablet Commonly known as: CRESTOR Take 1 tablet by mouth daily.   SYSTANE OP Place 1 drop into both eyes daily as needed (dry eyes).   VITAMIN B-12 PO Take 2,500 mcg by mouth every evening.               Durable Medical Equipment  (From admission, onward)           Start     Ordered   03/26/23 1758  DME 3 n 1  Once        03/26/23 1758   03/26/23 1758  DME Walker  rolling  Once       Question Answer Comment  Walker: With 5 Inch Wheels   Patient needs a walker to treat with the following condition Status post total left knee replacement      03/26/23 1758            Diagnostic Studies: DG Knee Left Port  Result Date: 03/26/2023 CLINICAL DATA:  Status post left knee replacement. EXAM: PORTABLE LEFT KNEE - 1-2 VIEW COMPARISON:  None Available. FINDINGS: Left knee arthroplasty in expected alignment. No periprosthetic lucency or fracture. There has been patellar resurfacing. Recent postsurgical change includes air and edema in the soft tissues and joint space. IMPRESSION: Left knee arthroplasty without immediate postoperative complication. Electronically Signed   By: Narda Rutherford M.D.   On: 03/26/2023 15:23    Disposition: Discharge disposition: 03-Skilled Nursing Facility          Follow-up Information     Kathryne Hitch, MD Follow up in 2 week(s).   Specialty: Orthopedic Surgery Contact information: [address] [place] [address] [phone]                  Signed: Kathryne Hitch 03/29/2023, 7:28 AM

## 2023-03-29 NOTE — TOC Initial Note (Signed)
Transition of Care [facility]) - Initial/Assessment Note    Patient Details  Name: Alison Chandler MRN: [id number] Date of Birth: 05/10/1941  Transition of Care [facility]) CM/SW Contact:    Amada Jupiter, LCSW Phone Number: 03/29/2023, 2:25 PM  Clinical Narrative:                 Met with pt and spouse today and confirming dc plan of SNF at [facility] and have confirmed with facility as well.  Have initiated insurance authorization and hope to transfer pt to SNF as soon as auth received.  Expected Discharge Plan: Skilled Nursing Facility Barriers to Discharge: Insurance Authorization   Patient Goals and CMS Choice Patient states their goals for this hospitalization and ongoing recovery are:: return home following SNF rehab          Expected Discharge Plan and Services In-house Referral: Clinical Social Work   Post Acute Care Choice: Skilled Nursing Facility Living arrangements for the past 2 months: Single Family Home Expected Discharge Date: 03/29/23               DME Arranged: N/A DME Agency: NA                  Prior Living Arrangements/Services Living arrangements for the past 2 months: Single Family Home Lives with:: Spouse Patient language and need for interpreter reviewed:: Yes Do you feel safe going back to the place where you live?: Yes      Need for Family Participation in Patient Care: No (Comment)     Criminal Activity/Legal Involvement Pertinent to Current Situation/Hospitalization: No - Comment as needed  Activities of Daily Living Home Assistive Devices/Equipment: Dentures (specify type) ADL Screening (condition at time of admission) Patient's cognitive ability adequate to safely complete daily activities?: Yes Is the patient deaf or have difficulty hearing?: No Does the patient have difficulty seeing, even when wearing glasses/contacts?: No Does the patient have difficulty concentrating, remembering, or making decisions?: No Patient able to express need for  assistance with ADLs?: Yes Does the patient have difficulty dressing or bathing?: No Independently performs ADLs?: Yes (appropriate for developmental age) Does the patient have difficulty walking or climbing stairs?: No Weakness of Legs: None Weakness of Arms/Hands: None  Permission Sought/Granted Permission sought to share information with : Family Supports Permission granted to share information with : Yes, Verbal Permission Granted  Share Information with NAME: spouse, Alison Chandler @ [phone]           Emotional Assessment Appearance:: Appears stated age Attitude/Demeanor/Rapport: Gracious Affect (typically observed): Accepting Orientation: : Oriented to Self, Oriented to Place, Oriented to  Time, Oriented to Situation Alcohol / Substance Use: Not Applicable Psych Involvement: No (comment)  Admission diagnosis:  Status post total left knee replacement [Z96.652] Patient Active Problem List   Diagnosis Date Noted   Status post total left knee replacement 03/26/2023   Fracture of right tibial plateau 07/15/2021   Closed fracture of right tibial plateau, initial encounter 07/15/2021   Unilateral primary osteoarthritis, left knee 12/19/2019   Benign essential hypertension 10/30/2019   Hypercalcemia 10/30/2019   Acute medial meniscal tear, right, initial encounter 08/16/2019   Iatrogenic hypocalcemia 06/05/2019   Primary hyperparathyroidism (HCC)    Nuclear sclerotic cataract of both eyes 10/05/2018   RPE mottling of macula 10/05/2018   Flat foot 06/20/2018   Tibialis posterior tendonitis, left 06/20/2018   Acute pain of right shoulder 05/04/2018   Chronic pain of left knee 12/22/2017   Subjective memory complaints  10/08/2017   It band syndrome, left 08/11/2017   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HCC) 03/23/2017   Meningioma (HCC) 03/23/2017   Idiopathic peripheral neuropathy 03/11/2017   Imbalance 03/11/2017   Paresthesias 12/06/2016   Tremor 12/06/2016   Age-related osteoporosis  without current pathological fracture 12/03/2016   Nephrolithiasis 12/03/2016   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/21/2016   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   Hypertropia of left eye 09/05/2014   Intermittent exotropia of left eye 09/05/2014   Midline cystocele 05/10/2014   Stress bladder incontinence, female 05/10/2014   Benign neoplasm of colon 04/16/2014   Calculus of ureter 04/16/2014   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   Esophageal reflux 04/16/2014   Gross hematuria 04/16/2014   Headache 04/16/2014   Hypothyroidism 04/16/2014   Increased urinary frequency 04/16/2014   Keratoconjunctivitis sicca, 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 04/16/2014   Osteopenia, 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   Other seborrheic keratosis 04/16/2014   Other synovitis and tenosynovitis 04/16/2014   Presbycusis 04/16/2014   Primary localized osteoarthrosis, pelvic region and thigh 04/16/2014   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   Strabismus 04/16/2014   Urethral stenosis 04/16/2014   Urinary tract infection 04/16/2014   Allergic rhinitis 04/16/2014   Degenerative arthritis of hip 02/19/2012   PCP:  Raynelle Jan., MD Pharmacy:   [id number] - [place], [place] - [address] [place] [address] Phone: [phone] Fax: [phone]  [id number] - [place] - [address] AT [facility] [address] [place] [address] Phone: [phone] Fax: [phone]     Social Determinants of Health (SDOH) Social History: SDOH Screenings   Food Insecurity: No Food Insecurity (03/26/2023)  Housing: Low Risk  (03/26/2023)  Transportation Needs: No Transportation Needs (03/26/2023)  Utilities: Not At Risk (03/26/2023)  Tobacco Use: Medium Risk (03/26/2023)   SDOH Interventions:     Readmission Risk Interventions    03/29/2023    2:23 PM  Readmission Risk Prevention Plan  Post Dischage Appt Complete   Medication Screening Complete  Transportation Screening Complete

## 2023-03-30 ENCOUNTER — Telehealth: Payer: Self-pay | Admitting: Orthopaedic Surgery

## 2023-03-30 NOTE — TOC Transition Note (Signed)
Transition of Care [facility]) - CM/SW Discharge Note   Patient Details  Name: Alison Chandler MRN: [id number] Date of Birth: 1941-04-14  Transition of Care [facility]) CM/SW Contact:  Amada Jupiter, LCSW Phone Number: 03/30/2023, 11:03 AM   Clinical Narrative:    Have received insurance authorization for [facility] SNF [phone]) and PTAR for transportation (# [phone]).  Pt medically cleared for dc today.  Pt and spouse aware and agreeable.  PTAR called a [phone].  RN to call report to [phone].  No further TOC needs.   Final next level of care: Skilled Nursing Facility Barriers to Discharge: Barriers Resolved   Patient Goals and CMS Choice      Discharge Placement PASRR number recieved: 03/29/23 PASRR number recieved: 03/29/23            Patient chooses bed at: [facility] Patient to be transferred to facility by: PTAR Name of family member notified: spouse Patient and family notified of of transfer: 03/30/23  Discharge Plan and Services Additional resources added to the After Visit Summary for   In-house Referral: Clinical Social Work   Post Acute Care Choice: Skilled Nursing Facility          DME Arranged: N/A DME Agency: NA                  Social Determinants of Health (SDOH) Interventions SDOH Screenings   Food Insecurity: No Food Insecurity (03/26/2023)  Housing: Low Risk  (03/26/2023)  Transportation Needs: No Transportation Needs (03/26/2023)  Utilities: Not At Risk (03/26/2023)  Tobacco Use: Medium Risk (03/26/2023)     Readmission Risk Interventions    03/29/2023    2:23 PM  Readmission Risk Prevention Plan  Post Dischage Appt Complete  Medication Screening Complete  Transportation Screening Complete

## 2023-03-30 NOTE — Progress Notes (Signed)
Physical Therapy Treatment Patient Details Name: Alison Chandler MRN: [id number] DOB: [birth year] Today's Date: 03/30/2023   History of Present Illness 82 yo female presents to therapy s/p L TKA on 03/26/2023 due to failure of conservative measures. Pt PMH includes but is not limited to: R tibial plateau fx, HTN, L ITB syndrome, meningioma, peripheral neuropathy, CT syndrome L wrist, hypothyroidism,parathyroid dysfunction, HOH, kidney stones, HLD, essential tremor, R THA (2013).    PT Comments  Pt politely declines amb as she is waiting to go to rehab. Exercise focus session with noted improved quad activation, no incr pain with strengthening and ROM    If plan is discharge home, recommend the following: A little help with bathing/dressing/bathroom;Assistance with cooking/housework;Assist for transportation;Help with stairs or ramp for entrance;A lot of help with walking and/or transfers   Can travel by private vehicle     No  Equipment Recommendations  None recommended by PT    Recommendations for Other Services       Precautions / Restrictions Precautions Precautions: Knee;Fall Restrictions Weight Bearing Restrictions: No LLE Weight Bearing: Weight bearing as tolerated     Mobility  Bed Mobility               General bed mobility comments: in recliner    Transfers                        Ambulation/Gait               General Gait Details: pt declined amb as she is waiting to leave for rehab   Stairs             Wheelchair Mobility     Tilt Bed    Modified Rankin (Stroke Patients Only)       Balance                                            Cognition Arousal/Alertness: Awake/alert Behavior During Therapy: WFL for tasks assessed/performed Overall Cognitive Status: Within Functional Limits for tasks assessed                                          Exercises Total Joint Exercises Ankle  Circles/Pumps: AROM, Both, 10 reps Quad Sets: AROM, Both, 10 reps Short Arc Quad: AROM, Left, 10 reps, Strengthening Heel Slides: AAROM, Left, 10 reps Hip ABduction/ADduction: AROM, AAROM, Left, 10 reps Straight Leg Raises: AAROM, AROM, Left, 15 reps, Supine Goniometric ROM: grossly ~ 10 to 65 degrees L knee  flexion    General Comments        Pertinent Vitals/Pain Pain Assessment Pain Assessment: 0-10 Pain Score: 3  Pain Location: L knee/leg Pain Descriptors / Indicators: Aching, Constant, Discomfort, Operative site guarding Pain Intervention(s): Limited activity within patient's tolerance, Monitored during session, Premedicated before session, Repositioned, Ice applied    Home Living                          Prior Function            PT Goals (current goals can now be found in the care plan section) Acute Rehab PT Goals Patient Stated Goal: to be able to walk normally PT Goal Formulation: With  patient Time For Goal Achievement: 04/16/23 Potential to Achieve Goals: Good Progress towards PT goals: Progressing toward goals    Frequency    7X/week      PT Plan Current plan remains appropriate    Co-evaluation              AM-PAC PT "6 Clicks" Mobility   Outcome Measure  Help needed turning from your back to your side while in a flat bed without using bedrails?: A Little Help needed moving from lying on your back to sitting on the side of a flat bed without using bedrails?: A Little Help needed moving to and from a bed to a chair (including a wheelchair)?: A Little Help needed standing up from a chair using your arms (e.g., wheelchair or bedside chair)?: A Little Help needed to walk in hospital room?: A Little Help needed climbing 3-5 steps with a railing? : A Little 6 Click Score: 18    End of Session Equipment Utilized During Treatment: Gait belt Activity Tolerance: Patient tolerated treatment well Patient left: in chair;with call bell/phone  within reach;with chair alarm set;with family/visitor present Nurse Communication: Mobility status PT Visit Diagnosis: Unsteadiness on feet (R26.81);Other abnormalities of gait and mobility (R26.89);Muscle weakness (generalized) (M62.81);Difficulty in walking, not elsewhere classified (R26.2);Pain Pain - Right/Left: Left Pain - part of body: Knee;Leg     Time: 1127-1140 PT Time Calculation (min) (ACUTE ONLY): 13 min  Charges:    $Therapeutic Exercise: 8-22 mins PT General Charges $$ ACUTE PT VISIT: 1 Visit                     Kaytelynn Scripter, PT  Acute Rehab Dept [facility]) [phone]  03/30/2023    [facility] 03/30/2023, 11:49 AM

## 2023-03-30 NOTE — Plan of Care (Signed)
Problem: Education: Goal: Knowledge of the prescribed therapeutic regimen will improve Outcome: Adequate for Discharge Goal: Individualized Educational Video(s) Outcome: Adequate for Discharge   Problem: Activity: Goal: Ability to avoid complications of mobility impairment will improve Outcome: Adequate for Discharge Goal: Range of joint motion will improve Outcome: Adequate for Discharge   Problem: Clinical Measurements: Goal: Postoperative complications will be avoided or minimized Outcome: Adequate for Discharge   Problem: Pain Management: Goal: Pain level will decrease with appropriate interventions Outcome: Adequate for Discharge   Problem: Skin Integrity: Goal: Will show signs of wound healing Outcome: Adequate for Discharge   Problem: Education: Goal: Knowledge of General Education information will improve Description: Including pain rating scale, medication(s)/side effects and non-pharmacologic comfort measures Outcome: Adequate for Discharge   Problem: Health Behavior/Discharge Planning: Goal: Ability to manage health-related needs will improve Outcome: Adequate for Discharge   Problem: Clinical Measurements: Goal: Ability to maintain clinical measurements within normal limits will improve Outcome: Adequate for Discharge Goal: Will remain free from infection Outcome: Adequate for Discharge Goal: Diagnostic test results will improve Outcome: Adequate for Discharge Goal: Respiratory complications will improve Outcome: Adequate for Discharge Goal: Cardiovascular complication will be avoided Outcome: Adequate for Discharge   Problem: Activity: Goal: Risk for activity intolerance will decrease Outcome: Adequate for Discharge   Problem: Nutrition: Goal: Adequate nutrition will be maintained Outcome: Adequate for Discharge   Problem: Coping: Goal: Level of anxiety will decrease Outcome: Adequate for Discharge   Problem: Elimination: Goal: Will not  experience complications related to bowel motility Outcome: Adequate for Discharge Goal: Will not experience complications related to urinary retention Outcome: Adequate for Discharge   Problem: Pain Managment: Goal: General experience of comfort will improve Outcome: Adequate for Discharge   Problem: Safety: Goal: Ability to remain free from injury will improve Outcome: Adequate for Discharge   Problem: Skin Integrity: Goal: Risk for impaired skin integrity will decrease Outcome: Adequate for Discharge   Problem: Education: Goal: Knowledge of the prescribed therapeutic regimen will improve Outcome: Adequate for Discharge Goal: Individualized Educational Video(s) Outcome: Adequate for Discharge   Problem: Activity: Goal: Ability to avoid complications of mobility impairment will improve Outcome: Adequate for Discharge Goal: Range of joint motion will improve Outcome: Adequate for Discharge   Problem: Clinical Measurements: Goal: Postoperative complications will be avoided or minimized Outcome: Adequate for Discharge   Problem: Pain Management: Goal: Pain level will decrease with appropriate interventions Outcome: Adequate for Discharge   Problem: Skin Integrity: Goal: Will show signs of wound healing Outcome: Adequate for Discharge   Problem: Education: Goal: Knowledge of General Education information will improve Description: Including pain rating scale, medication(s)/side effects and non-pharmacologic comfort measures Outcome: Adequate for Discharge   Problem: Health Behavior/Discharge Planning: Goal: Ability to manage health-related needs will improve Outcome: Adequate for Discharge   Problem: Clinical Measurements: Goal: Ability to maintain clinical measurements within normal limits will improve Outcome: Adequate for Discharge Goal: Will remain free from infection Outcome: Adequate for Discharge Goal: Diagnostic test results will improve Outcome: Adequate  for Discharge Goal: Respiratory complications will improve Outcome: Adequate for Discharge Goal: Cardiovascular complication will be avoided Outcome: Adequate for Discharge   Problem: Activity: Goal: Risk for activity intolerance will decrease Outcome: Adequate for Discharge   Problem: Nutrition: Goal: Adequate nutrition will be maintained Outcome: Adequate for Discharge   Problem: Coping: Goal: Level of anxiety will decrease Outcome: Adequate for Discharge   Problem: Elimination: Goal: Will not experience complications related to bowel motility Outcome: Adequate for Discharge Goal:  Will not experience complications related to urinary retention Outcome: Adequate for Discharge   Problem: Pain Managment: Goal: General experience of comfort will improve Outcome: Adequate for Discharge   Problem: Safety: Goal: Ability to remain free from injury will improve Outcome: Adequate for Discharge   Problem: Skin Integrity: Goal: Risk for impaired skin integrity will decrease Outcome: Adequate for Discharge

## 2023-03-30 NOTE — Progress Notes (Signed)
Patient ID: Alison Chandler, female   DOB: 07/11/1941, 82 y.o.   MRN: [id number] Awaiting insurance authorization for SNF bed.  Can go today if bed available.  Doing well otherwise.

## 2023-03-30 NOTE — Discharge Summary (Signed)
Patient ID: Alison Chandler MRN: [id number] DOB/AGE: [birth year] 82 y.o.  Admit date: 03/26/2023 Discharge date: 03/30/2023  Admission Diagnoses:  Principal Problem:   Unilateral primary osteoarthritis, left knee Active Problems:   Status post total left knee replacement   Discharge Diagnoses:  Same  Past Medical History:  Diagnosis Date   Arthritis    Hearing loss    History of kidney stones    Hyperlipidemia    Hypertension    Neuromuscular disorder (HCC)    neuropathy legs no medicine   Parathyroid dysfunction (HCC)    one Quit working was removed   Tremor, essential     Surgeries: Procedure(s): LEFT TOTAL KNEE ARTHROPLASTY on 03/26/2023   Consultants:   Discharged Condition: Improved  Hospital Course: Alison Chandler is an 82 y.o. female who was admitted 03/26/2023 for operative treatment ofUnilateral primary osteoarthritis, left knee. Patient has severe unremitting pain that affects sleep, daily activities, and work/hobbies. After pre-op clearance the patient was taken to the operating room on 03/26/2023 and underwent  Procedure(s): LEFT TOTAL KNEE ARTHROPLASTY.    Patient was given perioperative antibiotics:  Anti-infectives (From admission, onward)    Start     Dose/Rate Route Frequency Ordered Stop   03/26/23 1845  ceFAZolin (ANCEF) IVPB 1 g/50 mL premix        1 g 100 mL/hr over 30 Minutes Intravenous Every 6 hours 03/26/23 1758 03/27/23 0156   03/26/23 1115  ceFAZolin (ANCEF) IVPB 2g/100 mL premix        2 g 200 mL/hr over 30 Minutes Intravenous On call to O.R. 03/26/23 1102 03/26/23 1331        Patient was given sequential compression devices, early ambulation, and chemoprophylaxis to prevent DVT.  Patient benefited maximally from hospital stay and there were no complications.    Recent vital signs: Patient Vitals for the past 24 hrs:  BP Temp Temp src Pulse Resp SpO2  03/30/23 0556 (!) 158/92 98 F (36.7 C) Oral 85 18 97 %  03/29/23 2229 (!) 141/74  98.3 F (36.8 C) Oral 65 16 98 %  03/29/23 1346 (!) 146/72 98.5 F (36.9 C) Oral 66 15 97 %  03/29/23 1051 (!) 159/88 -- -- 72 -- 100 %     Recent laboratory studies: No results for input(s): "WBC", "HGB", "HCT", "PLT", "NA", "K", "CL", "CO2", "BUN", "CREATININE", "GLUCOSE", "INR", "CALCIUM" in the last 72 hours.  Invalid input(s): "PT", "2"   Discharge Medications:   Allergies as of 03/30/2023       Reactions   Actonel [risedronate Sodium] Nausea Only   Amlodipine Nausea Only        Medication List     STOP taking these medications    naproxen sodium 220 MG tablet Commonly known as: ALEVE   traMADol 50 MG tablet Commonly known as: ULTRAM       TAKE these medications    acetaminophen 500 MG tablet Commonly known as: TYLENOL Take 500 mg by mouth in the morning and at bedtime.   aspirin 81 MG chewable tablet Chew 1 tablet (81 mg total) by mouth 2 (two) times daily.   atenolol-chlorthalidone 50-25 MG tablet Commonly known as: TENORETIC Take 1 tablet by mouth daily.   CALCIUM PO Take 1 tablet by mouth daily.   Dialyvite Vitamin D 5000 125 MCG (5000 UT) capsule Generic drug: Cholecalciferol Take 5,000 Units by mouth every evening.   Fish Oil 1200 MG Caps Take 1,200 mg by mouth every evening.   GLUCOSAMINE-CHONDROITIN  PO Take 1 tablet by mouth every evening.   oxyCODONE 5 MG immediate release tablet Commonly known as: Oxy IR/ROXICODONE Take 1-2 tablets (5-10 mg total) by mouth every 6 (six) hours as needed for moderate pain (pain score 4-6).   rosuvastatin 20 MG tablet Commonly known as: CRESTOR Take 1 tablet by mouth daily.   SYSTANE OP Place 1 drop into both eyes daily as needed (dry eyes).   VITAMIN B-12 PO Take 2,500 mcg by mouth every evening.               Durable Medical Equipment  (From admission, onward)           Start     Ordered   03/26/23 1758  DME 3 n 1  Once        03/26/23 1758   03/26/23 1758  DME Walker rolling   Once       Question Answer Comment  Walker: With 5 Inch Wheels   Patient needs a walker to treat with the following condition Status post total left knee replacement      03/26/23 1758            Diagnostic Studies: DG Knee Left Port  Result Date: 03/26/2023 CLINICAL DATA:  Status post left knee replacement. EXAM: PORTABLE LEFT KNEE - 1-2 VIEW COMPARISON:  None Available. FINDINGS: Left knee arthroplasty in expected alignment. No periprosthetic lucency or fracture. There has been patellar resurfacing. Recent postsurgical change includes air and edema in the soft tissues and joint space. IMPRESSION: Left knee arthroplasty without immediate postoperative complication. Electronically Signed   By: Narda Rutherford M.D.   On: 03/26/2023 15:23    Disposition: Discharge disposition: 03-Skilled Nursing Facility          Contact information for follow-up providers     Kathryne Hitch, MD Follow up in 2 week(s).   Specialty: Orthopedic Surgery Contact information: [address] [place] [address] [phone]              Contact information for after-discharge care     Destination     [facility] .   Service: Skilled Nursing Contact information: [address] [place] [address] [phone]                      Signed: Kathryne Hitch 03/30/2023, 6:55 AM

## 2023-03-30 NOTE — Progress Notes (Signed)
Attempted to call report no answer at this time. D FranklinRN

## 2023-03-30 NOTE — Care Management Important Message (Signed)
Important Message  Patient Details IM Letter mailed due to Patient discharged. Name: Alison Chandler MRN: [id number] Date of Birth: [birth year]   Medicare Important Message Given:       [name] 03/30/2023, 2:45 PM

## 2023-03-30 NOTE — Progress Notes (Signed)
Removed PIV per order for SNF discharge.

## 2023-04-12 ENCOUNTER — Encounter: Payer: Self-pay | Admitting: Orthopaedic Surgery

## 2023-04-12 ENCOUNTER — Ambulatory Visit: Payer: HMO | Admitting: Orthopaedic Surgery

## 2023-04-12 DIAGNOSIS — Z96652 Presence of left artificial knee joint: Secondary | ICD-10-CM

## 2023-04-12 MED ORDER — HYDROCODONE-ACETAMINOPHEN 5-325 MG PO TABS: 1.0000 | ORAL_TABLET | Freq: Four times a day (QID) | ORAL | 0 refills | Status: AC | PRN

## 2023-04-12 NOTE — Progress Notes (Signed)
The patient is here for first postoperative visit status post a left total knee replacement.  She is an active 82 year old female.  After surgery she did stay at a skilled nursing facility but for only a short period of time.  She has had a home visit from therapy as well.  Her husband is with her today.  Her left knee incision looks good.  The staples have been removed and Steri-Strips applied.  Her extension is full and her flexion is just past 90 degrees.  Her calf is soft.  She understands that she needs to continue to work on range of motion of her knee as well as flexing and extending of the ankle and elevation for swelling.  Will see her back in 4 weeks to see how she is doing overall.  She should have home therapy for 1 to 2 weeks and then can set herself up for outpatient physical therapy in [facility].  I did give her a prescription for that.  I will send in some more hydrocodone for her.  All questions and concerns were answered and addressed.  If she does need a refill before she is comes to see [place] back in follow-up, she will call.

## 2023-05-10 ENCOUNTER — Ambulatory Visit (INDEPENDENT_AMBULATORY_CARE_PROVIDER_SITE_OTHER): Payer: HMO | Admitting: Orthopaedic Surgery

## 2023-05-10 ENCOUNTER — Encounter: Payer: Self-pay | Admitting: Orthopaedic Surgery

## 2023-05-10 DIAGNOSIS — Z96652 Presence of left artificial knee joint: Secondary | ICD-10-CM

## 2023-05-10 NOTE — Progress Notes (Signed)
The patient is now 6 weeks status post a left total knee arthroplasty.  She is 75 and very active and is doing incredibly well compared to a lot of people at 6 weeks.  On exam her extension is full and her flexion is to past 110 degrees.  The knee feels stable.  The incisions healed nicely.  Swelling is only mild.  I gave her the appropriate brace that she has really had of a lot of patients.  I would like to see her back in 4 weeks for repeat exam and at that visit we would like a standing AP and lateral of her left knee.  All questions and concerns were answered and addressed.

## 2023-06-09 ENCOUNTER — Ambulatory Visit (INDEPENDENT_AMBULATORY_CARE_PROVIDER_SITE_OTHER): Payer: HMO | Admitting: Orthopaedic Surgery

## 2023-06-09 ENCOUNTER — Encounter: Payer: Self-pay | Admitting: Orthopaedic Surgery

## 2023-06-09 ENCOUNTER — Other Ambulatory Visit (INDEPENDENT_AMBULATORY_CARE_PROVIDER_SITE_OTHER): Payer: HMO

## 2023-06-09 DIAGNOSIS — Z96652 Presence of left artificial knee joint: Secondary | ICD-10-CM | POA: Diagnosis not present

## 2023-06-09 NOTE — Progress Notes (Signed)
The patient is just under 3 months status post a left total knee arthroplasty.  She is 82 years old and has exceeded her expectations in terms of where she is right now.  She really looks like someone who is about 6 months out from surgery other than having the known numbness and tingling that people can have laterally with that knee.  She is ambulate with a cane.  She is doing very well with physical therapy.  On my exam her extension is full and her flexion is to past 120 degrees.  The knee feels ligamentously stable.  There is only minimal swelling.  She denies any significant pain and only takes Tylenol or Aleve on occasion.  2 views of the left knee show well-seated total knee arthroplasty with no complicating features.  From my standpoint the next time I need to see is not for 6 months unless she has any issues.  Will have a final AP and lateral of her left knee at that visit.

## 2023-12-08 ENCOUNTER — Ambulatory Visit: Payer: HMO | Admitting: Orthopaedic Surgery

## 2024-04-24 ENCOUNTER — Ambulatory Visit: Admitting: Orthopaedic Surgery

## 2024-05-29 ENCOUNTER — Ambulatory Visit: Admitting: Orthopaedic Surgery

## 2024-07-03 ENCOUNTER — Encounter: Payer: Self-pay | Admitting: Radiology
